# Patient Record
Sex: Female | Born: 1965 | Race: Black or African American | Hispanic: No | Marital: Single | Smoking: Current some day smoker
Health system: Southern US, Community
[De-identification: ages and names within clinical notes are randomized; demographics above are authoritative.]

## PROBLEM LIST (undated history)

## (undated) DIAGNOSIS — F32A Depression, unspecified: Secondary | ICD-10-CM

## (undated) DIAGNOSIS — K219 Gastro-esophageal reflux disease without esophagitis: Secondary | ICD-10-CM

## (undated) DIAGNOSIS — J45909 Unspecified asthma, uncomplicated: Secondary | ICD-10-CM

## (undated) DIAGNOSIS — M1990 Unspecified osteoarthritis, unspecified site: Secondary | ICD-10-CM

## (undated) DIAGNOSIS — I10 Essential (primary) hypertension: Secondary | ICD-10-CM

## (undated) DIAGNOSIS — J449 Chronic obstructive pulmonary disease, unspecified: Secondary | ICD-10-CM

## (undated) HISTORY — PX: APPENDECTOMY: SHX54

## (undated) HISTORY — DX: Chronic obstructive pulmonary disease, unspecified: J44.9

---

## 2015-07-18 ENCOUNTER — Other Ambulatory Visit (HOSPITAL_COMMUNITY): Payer: Self-pay | Admitting: Physician Assistant

## 2015-07-18 DIAGNOSIS — Z1231 Encounter for screening mammogram for malignant neoplasm of breast: Secondary | ICD-10-CM

## 2015-07-28 ENCOUNTER — Ambulatory Visit (HOSPITAL_COMMUNITY)
Admission: RE | Admit: 2015-07-28 | Discharge: 2015-07-28 | Disposition: A | Discharge status: Home / Self Care | Payer: Self-pay | Source: Ambulatory Visit | Attending: Physician Assistant | Admitting: Physician Assistant

## 2015-07-28 ENCOUNTER — Other Ambulatory Visit (HOSPITAL_COMMUNITY): Payer: Self-pay | Admitting: Physician Assistant

## 2015-07-28 DIAGNOSIS — R059 Cough, unspecified: Secondary | ICD-10-CM

## 2015-07-28 DIAGNOSIS — F17200 Nicotine dependence, unspecified, uncomplicated: Secondary | ICD-10-CM | POA: Insufficient documentation

## 2015-07-28 DIAGNOSIS — Z1231 Encounter for screening mammogram for malignant neoplasm of breast: Secondary | ICD-10-CM | POA: Insufficient documentation

## 2015-07-28 DIAGNOSIS — R05 Cough: Secondary | ICD-10-CM

## 2016-07-18 ENCOUNTER — Ambulatory Visit: Payer: Self-pay | Admitting: Physician Assistant

## 2016-07-22 ENCOUNTER — Encounter: Payer: Self-pay | Admitting: Physician Assistant

## 2019-04-20 ENCOUNTER — Other Ambulatory Visit: Payer: Self-pay

## 2019-04-20 ENCOUNTER — Ambulatory Visit: Payer: Self-pay | Admitting: Physician Assistant

## 2019-04-20 ENCOUNTER — Encounter: Payer: Self-pay | Admitting: Physician Assistant

## 2019-04-20 VITALS — BP 128/88 | HR 102 | Temp 97.5°F

## 2019-04-20 DIAGNOSIS — Z7689 Persons encountering health services in other specified circumstances: Secondary | ICD-10-CM

## 2019-04-20 DIAGNOSIS — J209 Acute bronchitis, unspecified: Secondary | ICD-10-CM

## 2019-04-20 DIAGNOSIS — F17200 Nicotine dependence, unspecified, uncomplicated: Secondary | ICD-10-CM | POA: Insufficient documentation

## 2019-04-20 MED ORDER — BENZONATATE 100 MG PO CAPS: ORAL_CAPSULE | ORAL | 3 refills | Status: DC

## 2019-04-20 MED ORDER — ALBUTEROL SULFATE HFA 108 (90 BASE) MCG/ACT IN AERS
2.0000 | INHALATION_SPRAY | Freq: Four times a day (QID) | RESPIRATORY_TRACT | 1 refills | Status: DC | PRN
Start: 2019-04-20 — End: 2019-06-23

## 2019-04-20 MED ORDER — AZITHROMYCIN 250 MG PO TABS: ORAL_TABLET | ORAL | 0 refills | Status: DC

## 2019-04-20 NOTE — Patient Instructions (Signed)
How to Use a Soft Mist Inhaler    A soft mist inhaler is a handheld device for taking medicine that you breathe (inhale) into your lungs. The device changes a liquid medicine into a mist that can be inhaled.  You may need a soft mist inhaler if you have a disease that causes your breathing tubes to narrow (bronchospasm). Using a soft mist inhaler helps prevent bronchospasm and keeps your airway open. A soft mist inhaler may be part of your long-term treatment for asthma or chronic obstructive pulmonary disease (COPD). The usual dosage is two inhalations every day.  What are the risks?   If you do not use your inhaler correctly, medicine might not reach your lungs to help you breathe.   The medicine in the inhaler can cause side effects, such as:  ? Chest tightness or difficulty breathing.  ? Eye redness, eye pain, or vision changes.  ? Difficulty passing urine.  ? Dry mouth.  ? Sore throat.  ? Cough.  ? Headache.  ? Sinus congestion (sinusitis).  Supplies needed:   Inhaler. The medicine that you will need comes in the inhaler. Each device contains the amount of medicine needed for 60 uses (30 daily doses of 2 inhalations).  How to use a soft mist inhaler  Using an inhaler for the first time  1. Remove the clear base of the inhaler by pressing the safety catch on the cap with your thumb and pulling off the clear base with your other hand.  2. On the label, write down the date that will be three months from now. This is the date you should throw away the inhaler.  3. Place the medicine cartridge that comes with the inhaler into the base of the inhaler. Press the cartridge on a flat surface to click it into place. Click the clear plastic base back into place over the cartridge.  4. Turn the clear base in the direction of the arrows on the label until you hear a click.  5. Open the cap on top of the inhaler. It should snap open all the way to show you the mouthpiece.  6. Prepare (prime) the inhaler for use. To do  this, point the inhaler toward the ground and press on the dose-release button below the mouthpiece. You should see the release of some mist. Be careful not to get any mist into your eyes. If you do not see mist, turn the base, open the cap, and prime the inhaler again until you see the mist. If you still do not see the mist, return the inhaler to your pharmacist for help.  Taking an inhaled dose  1. Hold the inhaler upright.  2. Use your thumb and pointer finger to turn the base of the inhaler until you hear a click. This means the dose chamber is ready to deliver the medicine.  3. Open the cap until you hear a click.  4. Hold the inhaler in one hand with your pointer finger over the dose-release button.  5. Turn your head away from the inhaler and breathe out slowly.  6. Close your lips around the mouthpiece.  7. Point the inhaler toward the back of your mouth.  8. Press the dose-release button while taking a slow, deep breath through your mouth.  9. Hold your breath for 10 seconds, or as long as you can.  10. Turn your head away from the inhaler and breath out slowly through pursed lips.  11. Take a second   inhalation, if your health care provider told you to. Do not take extra doses if you do not feel the mist as you inhale.  Follow these instructions at home:  General instructions   Check the indicator on the inhaler to keep track of your doses. When the indicator is in the red zone, you have 7 days left. Get a refill at this time. The inhaler will lock when it is empty.   Throw away your inhaler if you have not used it in more than 30 days.   Do not use any products that contain nicotine or tobacco, such as cigarettes and e-cigarettes. If you need help quitting, ask your health care provider.   Tell your provider about:  ? All your medical conditions. Use soft mist medicine with caution if you have glaucoma, an enlarged prostate, or kidney disease.  ? If you are or may become pregnant.  ? All medicines you  take. Some medicines can affect (interact with) the medicines in your inhaler.   Keep all follow-up visits as told by your health care provider. This is important.  Using your inhaler   Use your soft mist inhaler only as told by your health care provider.   Do inhalations at about the same time each day.   If you have not used your inhaler for more than 3 days, release a mist dose toward the ground before using.   If you have not used your inhaler for more than 21 days, open the cap, turn the base, and prime your inhaler until you see mist. Repeat these steps three more times before using the inhaler.  Caring for your inhaler   Store your soft mist inhaler at room temperature and keep it out of reach of children.   Clean the mouthpiece of your inhaler with a damp, clean, cloth once every week.  Contact a health care provider if:   You have a very dry mouth or sore throat.   You have a fever.   You have stuffy nose (nasal congestion) or nasal discharge.   You have a cough that does not go away (is persistent).   You have a headache.   You have trouble passing urine.   You are not sure how to use your inhaler or your inhaler is not working properly.  Get help right away if:   You have an allergic reaction. Some symptoms of an allergic reaction are an itchy rash, swelling of your face or tongue, or difficulty breathing.   You have severe and sudden eye pain or changes in your vision.  Summary   A soft mist inhaler is a treatment for COPD or asthma.   You may have to take two inhalations each day on a long-term basis to prevent bronchospasm.   Follow instructions carefully in order to use your inhaler properly.   Common side effects include throat or sinus infection, dry mouth, cough, and headache.   Get help right away if you have an allergic reaction, sudden eye pain, or changes in vision.  This information is not intended to replace advice given to you by your health care provider. Make sure you  discuss any questions you have with your health care provider.  Document Released: 12/05/2016 Document Revised: 12/05/2016 Document Reviewed: 12/05/2016  Elsevier Interactive Patient Education  2019 [employer]

## 2019-04-20 NOTE — Progress Notes (Signed)
BP 128/88   Pulse (!) 102   Temp (!) 97.5 F (36.4 C)   SpO2 99%    Subjective:    Patient ID: Judy Leonard, female    DOB: 14-Dec-1966, 53 y.o.   MRN: [id number]  HPI: Judy Leonard is a 53 y.o. female presenting on 04/20/2019 for New Patient (Initial Visit) ([facility]. [facility] (as [facility]). pt states she has not been seen anywhere else since. pt states [facility] had problems with her lungs.); Back Pain (upper back raditaes down to lower back); and Cough (worse at night. pt states has phlegm. cough began around 01/2019. pt does not take anything for congestion or cough)   HPI     [facility]- she only came in for 2 appoitments- June and July 2016.  At that time she had   Elevated triglycerides, tobacco use disorder, cough.   Pt is still smoking although she has cut back to about 6/day  Pt thinks she started coughing in January. She says she is  Not coughing blood.  The cough is  Worse at night.   She is unaware of wheezing.   She states No fevers.    [facility].  her Last mammogram was in 2016.    Relevant past medical, surgical, family and social history reviewed and updated as indicated. Interim medical history since our last visit reviewed. Allergies and medications reviewed and updated.  CURRENT MEDS: none  Review of Systems  Per HPI unless specifically indicated above     Objective:    BP 128/88   Pulse (!) 102   Temp (!) 97.5 F (36.4 C)   SpO2 99%   Wt Readings from Last 3 Encounters:  No data found for Wt    Physical Exam Vitals signs reviewed.  Constitutional:      Appearance: She is well-developed.  HENT:     Head: Normocephalic and atraumatic.     Right Ear: Tympanic membrane normal.     Left Ear: Tympanic membrane normal.  Eyes:     Conjunctiva/sclera: Conjunctivae normal.     Pupils: Pupils are equal, round, and reactive to light.  Neck:   Musculoskeletal: Neck supple.     Thyroid: No thyromegaly.  Cardiovascular:     Rate and Rhythm: Normal rate and regular rhythm.  Pulmonary:     Effort: Pulmonary effort is normal.     Breath sounds: Normal breath sounds.  Abdominal:     General: Bowel sounds are normal.     Palpations: Abdomen is soft. There is no mass.     Tenderness: There is no abdominal tenderness.  Lymphadenopathy:     Cervical: No cervical adenopathy.  Skin:    General: Skin is warm and dry.  Neurological:     Mental Status: She is alert and oriented to person, place, and time.     Gait: Gait normal.  Psychiatric:        Behavior: Behavior normal.     No results found for this or any previous visit.    Assessment & Plan:   Encounter Diagnoses  Name Primary?  . Encounter to establish care Yes  . Acute bronchitis, unspecified organism   . Tobacco use disorder       -will update mammogram when those are being ordered again (due to CV19) -will order Inhaler from [facility].  Pt is given reading information  on how to properly use it  -rx zpack and tessalon -counseled pt on smoking cessation  -Pt has a smartphone  -will schedule pt to follow up in 2 months.  She is counseled to contact clinic prior to that if needed for worsening or new symptoms

## 2019-05-20 ENCOUNTER — Other Ambulatory Visit: Payer: Self-pay | Admitting: Physician Assistant

## 2019-05-20 DIAGNOSIS — Z1239 Encounter for other screening for malignant neoplasm of breast: Secondary | ICD-10-CM

## 2019-06-07 ENCOUNTER — Other Ambulatory Visit: Payer: Self-pay

## 2019-06-07 ENCOUNTER — Ambulatory Visit (HOSPITAL_COMMUNITY)
Admission: RE | Admit: 2019-06-07 | Discharge: 2019-06-07 | Disposition: A | Discharge status: Home / Self Care | Payer: Self-pay | Source: Ambulatory Visit | Attending: Physician Assistant | Admitting: Physician Assistant

## 2019-06-07 DIAGNOSIS — Z1239 Encounter for other screening for malignant neoplasm of breast: Secondary | ICD-10-CM | POA: Insufficient documentation

## 2019-06-10 ENCOUNTER — Other Ambulatory Visit: Payer: Self-pay | Admitting: Physician Assistant

## 2019-06-10 DIAGNOSIS — Z8639 Personal history of other endocrine, nutritional and metabolic disease: Secondary | ICD-10-CM

## 2019-06-22 ENCOUNTER — Encounter: Payer: Self-pay | Admitting: Physician Assistant

## 2019-06-22 ENCOUNTER — Ambulatory Visit: Payer: Self-pay | Admitting: Physician Assistant

## 2019-06-22 DIAGNOSIS — J449 Chronic obstructive pulmonary disease, unspecified: Secondary | ICD-10-CM

## 2019-06-22 DIAGNOSIS — F17200 Nicotine dependence, unspecified, uncomplicated: Secondary | ICD-10-CM

## 2019-06-22 NOTE — Progress Notes (Signed)
   There were no vitals taken for this visit.   Subjective:    Patient ID: Judy Leonard, female    DOB: February 24, 1966, 53 y.o.   MRN: [id number]  HPI: Judy Leonard is a 53 y.o. female presenting on 06/22/2019 for No chief complaint on file.   HPI    This is a telemedicine visit through [employer] due to coronavirus pandemic  I connected with  Judy Leonard on 06/22/19 by a video enabled telemedicine application and verified that I am speaking with the correct person using two identifiers.   I discussed the limitations of evaluation and management by telemedicine. The patient expressed understanding and agreed to proceed.  Pt is at home.  Provider is at office.    Pt is still having cough but is not any worse than when she was seen in April.  The bad wheeziing has cleared up.  She uses her inhaler at least twice daily and sometimes up to 4 ttimes daily.   She is still smoking.  She says she is feeling "pretty good".  Pt did not get her labs drawn.   Relevant past medical, surgical, family and social history reviewed and updated as indicated. Interim medical history since our last visit reviewed. Allergies and medications reviewed and updated.   Current Outpatient Medications:  .  albuterol (VENTOLIN HFA) 108 (90 Base) MCG/ACT inhaler, Inhale 2 puffs into the lungs every 6 (six) hours as needed for wheezing or shortness of breath., Disp: 1 Inhaler, Rfl: 1 .  benzonatate (TESSALON) 100 MG capsule, 1 - 2 po q 8 hour prn cough, Disp: 20 capsule, Rfl: 3    Review of Systems  Per HPI unless specifically indicated above     Objective:    There were no vitals taken for this visit.  Wt Readings from Last 3 Encounters:  No data found for Wt    Physical Exam Constitutional:      General: She is not in acute distress.    Appearance: Normal appearance. She is not ill-appearing.  HENT:     Head: Normocephalic and atraumatic.  Pulmonary:     Effort: Pulmonary effort is  normal. No respiratory distress.  Neurological:     Mental Status: She is alert and oriented to person, place, and time.  Psychiatric:        Attention and Perception: Attention normal.        Mood and Affect: Mood normal.        Speech: Speech normal.        Behavior: Behavior normal. Behavior is cooperative.     No results found for this or any previous visit.    Assessment & Plan:    Encounter Diagnoses  Name Primary?  . Chronic obstructive pulmonary disease, unspecified COPD type ([place]) Yes  . Tobacco use disorder     -Will get pt signed up for [facility] medication assistance program and order dulera for her as a maintenance inhaler.  She is to continue with albuterol MDI prn.  Encouraged smoking cessation.  Discussed that inhalers will only be able to help so much if she continues smoking and that continued smoking will continue to worsen her breathing.  Pt states understanding.   -pt to get fasting labs -pt to follow up in 6 weeks to check on her breathing on the dulera.  She is to contact office sooner prn

## 2019-06-23 ENCOUNTER — Other Ambulatory Visit: Payer: Self-pay | Admitting: Physician Assistant

## 2019-06-23 ENCOUNTER — Other Ambulatory Visit: Payer: Self-pay

## 2019-06-23 ENCOUNTER — Other Ambulatory Visit (HOSPITAL_COMMUNITY)
Admission: RE | Admit: 2019-06-23 | Discharge: 2019-06-23 | Disposition: A | Discharge status: Home / Self Care | Payer: Self-pay | Source: Ambulatory Visit | Attending: Physician Assistant | Admitting: Physician Assistant

## 2019-06-23 DIAGNOSIS — Z8639 Personal history of other endocrine, nutritional and metabolic disease: Secondary | ICD-10-CM | POA: Insufficient documentation

## 2019-06-23 LAB — COMPREHENSIVE METABOLIC PANEL
ALT: 59 U/L — ABNORMAL HIGH (ref 0–44)
AST: 76 U/L — ABNORMAL HIGH (ref 15–41)
Albumin: 3.7 g/dL (ref 3.5–5.0)
Alkaline Phosphatase: 107 U/L (ref 38–126)
Anion gap: 10 (ref 5–15)
BUN: 8 mg/dL (ref 6–20)
CO2: 28 mmol/L (ref 22–32)
Calcium: 9.4 mg/dL (ref 8.9–10.3)
Chloride: 102 mmol/L (ref 98–111)
Creatinine, Ser: 0.73 mg/dL (ref 0.44–1.00)
GFR calc Af Amer: >60 mL/min (ref >60)
GFR calc non Af Amer: >60 mL/min (ref >60)
Glucose, Bld: 112 mg/dL — ABNORMAL HIGH (ref 70–99)
Potassium: 4.9 mmol/L (ref 3.5–5.1)
Sodium: 140 mmol/L (ref 135–145)
Total Bilirubin: 1.1 mg/dL (ref 0.3–1.2)
Total Protein: 7.3 g/dL (ref 6.5–8.1)

## 2019-06-23 LAB — LIPID PANEL
Cholesterol: 217 mg/dL — ABNORMAL HIGH (ref 0–200)
HDL: 72 mg/dL (ref >40)
LDL Cholesterol: 126 mg/dL — ABNORMAL HIGH (ref 0–99)
Total CHOL/HDL Ratio: 3 RATIO
Triglycerides: 96 mg/dL (ref <150)
VLDL: 19 mg/dL (ref 0–40)

## 2019-06-23 MED ORDER — DULERA 100-5 MCG/ACT IN AERO: 2.0000 | INHALATION_SPRAY | Freq: Two times a day (BID) | RESPIRATORY_TRACT | 1 refills | Status: DC

## 2019-08-17 ENCOUNTER — Ambulatory Visit: Payer: Self-pay | Admitting: Physician Assistant

## 2019-08-17 ENCOUNTER — Encounter: Payer: Self-pay | Admitting: Physician Assistant

## 2019-08-17 DIAGNOSIS — J449 Chronic obstructive pulmonary disease, unspecified: Secondary | ICD-10-CM

## 2019-08-17 DIAGNOSIS — R7989 Other specified abnormal findings of blood chemistry: Secondary | ICD-10-CM

## 2019-08-17 DIAGNOSIS — G8929 Other chronic pain: Secondary | ICD-10-CM

## 2019-08-17 DIAGNOSIS — M549 Dorsalgia, unspecified: Secondary | ICD-10-CM

## 2019-08-17 DIAGNOSIS — E785 Hyperlipidemia, unspecified: Secondary | ICD-10-CM

## 2019-08-17 MED ORDER — MELOXICAM 15 MG PO TABS: 15.0000 mg | ORAL_TABLET | Freq: Every day | ORAL | 0 refills | Status: DC | PRN

## 2019-08-17 NOTE — Patient Instructions (Signed)

## 2019-08-17 NOTE — Progress Notes (Signed)
There were no vitals taken for this visit.   Subjective:    Patient ID: Judy Leonard, female    DOB: 1966-05-02, 53 y.o.   MRN: [id number]  HPI: Judy JunesDorothea A. Wyline Leonard is a 53 y.o. female presenting on 08/17/2019 for No chief complaint on file.   HPI  This is a telemedicine appointment through [employer] due to coronavirus pandemic  I connected with  Charma A. Levitz on 08/17/19 by a video enabled telemedicine application and verified that I am speaking with the correct person using two identifiers.   I discussed the limitations of evaluation and management by telemedicine. The patient expressed understanding and agreed to proceed.  Pt is at home.  Provider is at office.   Pt says she hasn't smoked for about the past month.    She is using the dulera inhaler.  Her breathing is improved.    Pt says her back hurts.  She says it hurts like that for alost a year.  It hurts in the top, the middle and sometimes the lower.  Pt doesn't work currently.  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  IBU doesn't help her pain.    Pt says she exercises by walking around the yard for about 10 minutes.  She says she starts coughing about that time (after 10 minutes).    She doesn't use ice or heat on the back.   She denies injury that could have caused her back pain.  She has no numbness or tingling in the extremities.  She denies weakness.     Relevant past medical, surgical, family and social history reviewed and updated as indicated. Interim medical history since our last visit reviewed. Allergies and medications reviewed and updated.   Current Outpatient Medications:  .  benzonatate (TESSALON) 100 MG capsule, 1 - 2 po q 8 hour prn cough, Disp: 20 capsule, Rfl: 3 .  mometasone-formoterol (DULERA) 100-5 MCG/ACT AERO, Inhale 2 puffs into the lungs 2 (two) times daily., Disp: 39 g, Rfl: 1 .  PROVENTIL HFA 108 (90 Base) MCG/ACT inhaler, INHALE 2 PUFFS BY MOUTH EVERY 6  HOURS AS NEEDED FOR COUGHING, WHEEZING, OR SHORTNESS OF BREATH, Disp: 6.7 g, Rfl: 1    Review of Systems  Per HPI unless specifically indicated above     Objective:    There were no vitals taken for this visit.  Wt Readings from Last 3 Encounters:  No data found for Wt    Physical Exam Constitutional:      General: She is not in acute distress.    Appearance: Normal appearance. She is not ill-appearing.  HENT:     Head: Normocephalic and atraumatic.  Pulmonary:     Effort: Pulmonary effort is normal. No respiratory distress.  Musculoskeletal:     Cervical back: She exhibits normal range of motion.     Thoracic back: She exhibits normal range of motion.     Lumbar back: She exhibits normal range of motion.     Comments: Pt has good ROM.  She is able to move all extremities normally.  She has good flexion and extension of the back.  Neurological:     Mental Status: She is alert and oriented to person, place, and time.  Psychiatric:        Attention and Perception: Attention normal.        Speech: Speech normal.        Behavior: Behavior is cooperative.  Assessment & Plan:    Encounter Diagnoses  Name Primary?  . Chronic obstructive pulmonary disease, unspecified COPD type ([place]) Yes  . Chronic back pain, unspecified back location, unspecified back pain laterality   . Hyperlipidemia, unspecified hyperlipidemia type   . Elevated LFTs      -Congratulate pt on stopping smoking.  Encouraged her to continue to avoid smoking -Exercises for back mailed to pt.  rx mobic with instructions to avoid IBU or other nsaid while taking.  Use ice and/or heat -she is reminded to be following a lowfat diet for her lipids -pt to follow up 3 months to check copd.  She is to contact office sooner prn

## 2019-11-10 ENCOUNTER — Other Ambulatory Visit (HOSPITAL_COMMUNITY)
Admission: RE | Admit: 2019-11-10 | Discharge: 2019-11-10 | Disposition: A | Discharge status: Home / Self Care | Payer: Self-pay | Source: Ambulatory Visit | Attending: Physician Assistant | Admitting: Physician Assistant

## 2019-11-10 DIAGNOSIS — R7989 Other specified abnormal findings of blood chemistry: Secondary | ICD-10-CM | POA: Insufficient documentation

## 2019-11-10 DIAGNOSIS — E785 Hyperlipidemia, unspecified: Secondary | ICD-10-CM | POA: Insufficient documentation

## 2019-11-10 LAB — COMPREHENSIVE METABOLIC PANEL
ALT: 57 U/L — ABNORMAL HIGH (ref 0–44)
AST: 58 U/L — ABNORMAL HIGH (ref 15–41)
Albumin: 3.8 g/dL (ref 3.5–5.0)
Alkaline Phosphatase: 105 U/L (ref 38–126)
Anion gap: 9 (ref 5–15)
BUN: 10 mg/dL (ref 6–20)
CO2: 25 mmol/L (ref 22–32)
Calcium: 9.4 mg/dL (ref 8.9–10.3)
Chloride: 105 mmol/L (ref 98–111)
Creatinine, Ser: 0.62 mg/dL (ref 0.44–1.00)
GFR calc Af Amer: >60 mL/min (ref >60)
GFR calc non Af Amer: >60 mL/min (ref >60)
Glucose, Bld: 110 mg/dL — ABNORMAL HIGH (ref 70–99)
Potassium: 4.1 mmol/L (ref 3.5–5.1)
Sodium: 139 mmol/L (ref 135–145)
Total Bilirubin: 0.8 mg/dL (ref 0.3–1.2)
Total Protein: 7.4 g/dL (ref 6.5–8.1)

## 2019-11-10 LAB — LIPID PANEL
Cholesterol: 209 mg/dL — ABNORMAL HIGH (ref 0–200)
HDL: 58 mg/dL (ref >40)
LDL Cholesterol: 118 mg/dL — ABNORMAL HIGH (ref 0–99)
Total CHOL/HDL Ratio: 3.6 RATIO
Triglycerides: 163 mg/dL — ABNORMAL HIGH (ref <150)
VLDL: 33 mg/dL (ref 0–40)

## 2019-11-15 ENCOUNTER — Ambulatory Visit: Payer: Self-pay | Admitting: Physician Assistant

## 2019-11-15 ENCOUNTER — Encounter: Payer: Self-pay | Admitting: Physician Assistant

## 2019-11-15 DIAGNOSIS — F17200 Nicotine dependence, unspecified, uncomplicated: Secondary | ICD-10-CM

## 2019-11-15 DIAGNOSIS — G8929 Other chronic pain: Secondary | ICD-10-CM

## 2019-11-15 DIAGNOSIS — J449 Chronic obstructive pulmonary disease, unspecified: Secondary | ICD-10-CM

## 2019-11-15 DIAGNOSIS — R7989 Other specified abnormal findings of blood chemistry: Secondary | ICD-10-CM

## 2019-11-15 DIAGNOSIS — E785 Hyperlipidemia, unspecified: Secondary | ICD-10-CM

## 2019-11-15 NOTE — Progress Notes (Signed)
There were no vitals taken for this visit.   Subjective:    Patient ID: Judy Leonard, female    DOB: November 23, 1966, 53 y.o.   MRN: [id number]  HPI: Judy Leonard is a 53 y.o. female presenting on 11/15/2019 for No chief complaint on file.   HPI   This is a telemedicine appointment due to coronavirus pandemic.  It is via Telephone as pt does not have a video enabled device  I connected with  Judy Leonard on 11/15/19 by a video enabled telemedicine application and verified that I am speaking with the correct person using two identifiers.   I discussed the limitations of evaluation and management by telemedicine. The patient expressed understanding and agreed to proceed.  Pt is at home.  Provider is at home office.    Pt is currently Not working- back hurts - lower back and neck.  She previously worked Neurosurgeon.    She continues to smoke.  She is not using dulera properly because she is trying to make it last longer.    She is needing to use her albuterol most days.   Pt has no new complaints today.    Relevant past medical, surgical, family and social history reviewed and updated as indicated. Interim medical history since our last visit reviewed. Allergies and medications reviewed and updated.    Current Outpatient Medications:  .  benzonatate (TESSALON) 100 MG capsule, 1 - 2 po q 8 hour prn cough, Disp: 20 capsule, Rfl: 3 .  meloxicam (MOBIC) 15 MG tablet, Take 1 tablet (15 mg total) by mouth daily as needed for pain., Disp: 30 tablet, Rfl: 0 .  mometasone-formoterol (DULERA) 100-5 MCG/ACT AERO, Inhale 2 puffs into the lungs 2 (two) times daily., Disp: 39 g, Rfl: 1 .  PROVENTIL HFA 108 (90 Base) MCG/ACT inhaler, INHALE 2 PUFFS BY MOUTH EVERY 6 HOURS AS NEEDED FOR COUGHING, WHEEZING, OR SHORTNESS OF BREATH, Disp: 6.7 g, Rfl: 1   Review of Systems  Per HPI unless specifically indicated above     Objective:    There were no vitals taken for this  visit.  Wt Readings from Last 3 Encounters:  No data found for Wt    Physical Exam Pulmonary:     Effort: No respiratory distress.  Neurological:     Mental Status: She is alert and oriented to person, place, and time.  Psychiatric:        Attention and Perception: Attention normal.        Speech: Speech normal.        Behavior: Behavior is cooperative.     Results for orders placed or performed during the hospital encounter of 11/10/19  Lipid panel  Result Value Ref Range   Cholesterol 209 (H) 0 - 200 mg/dL   Triglycerides 163 (H) <150 mg/dL   HDL 58 >40 mg/dL   Total CHOL/HDL Ratio 3.6 RATIO   VLDL 33 0 - 40 mg/dL   LDL Cholesterol 118 (H) 0 - 99 mg/dL  Comprehensive metabolic panel  Result Value Ref Range   Sodium 139 135 - 145 mmol/L   Potassium 4.1 3.5 - 5.1 mmol/L   Chloride 105 98 - 111 mmol/L   CO2 25 22 - 32 mmol/L   Glucose, Bld 110 (H) 70 - 99 mg/dL   BUN 10 6 - 20 mg/dL   Creatinine, Ser 0.62 0.44 - 1.00 mg/dL   Calcium 9.4 8.9 - 10.3 mg/dL   Total Protein 7.4  6.5 - 8.1 g/dL   Albumin 3.8 3.5 - 5.0 g/dL   AST 58 (H) 15 - 41 U/L   ALT 57 (H) 0 - 44 U/L   Alkaline Phosphatase 105 38 - 126 U/L   Total Bilirubin 0.8 0.3 - 1.2 mg/dL   GFR calc non Af Amer >60 >60 mL/min   GFR calc Af Amer >60 >60 mL/min   Anion gap 9 5 - 15      Assessment & Plan:    1.  COPD/tobacco use disorder  Discussed with patient that she gets her medications in the mail for free at no cost therefore she needs to use her Dulera every day twice a day as prescribed and that I can work better.  Discussed that she will get refills so she needs to use the medicine so that it can work properly.  I did encourage patient to stop smoking as this will provide her the best bet for improved breathing.  Refills are sent to [facility].  2 back pain chronic  We will get x-rays of the neck and lumbar spine.  Patient will be mailed a change charity care financial assistance application that she  needs to fill out to cover the cost for the x-rays.  She will be called with the results of the x-rays when they are done.  3.  Dyslipidemia  Reviewed labs with patient.  Encourage patient to follow a low-fat diet and regular exercise to help her lipids.  No prescription medication for the lipids at this time.  4.  Elevated liver function studies.  Patient is encouraged to avoid taking more Tylenol than indicated on the bottle.  Recommended that patient use NSAIDs as primary analgesic.  Patient is encouraged to avoid drinking.  We will continue to monitor the liver function studies.   Patient is to follow-up in 3 months for recheck of her COPD.  She is to contact office sooner if needed.  No labs prior to next appointment.

## 2019-12-02 ENCOUNTER — Other Ambulatory Visit: Payer: Self-pay | Admitting: Physician Assistant

## 2019-12-09 ENCOUNTER — Other Ambulatory Visit: Payer: Self-pay | Admitting: Physician Assistant

## 2019-12-09 MED ORDER — MELOXICAM 15 MG PO TABS: 15.0000 mg | ORAL_TABLET | Freq: Every day | ORAL | 0 refills | Status: DC | PRN

## 2020-01-04 ENCOUNTER — Ambulatory Visit (HOSPITAL_COMMUNITY)
Admission: RE | Admit: 2020-01-04 | Discharge: 2020-01-04 | Disposition: A | Discharge status: Home / Self Care | Payer: Self-pay | Source: Ambulatory Visit | Attending: Physician Assistant | Admitting: Physician Assistant

## 2020-01-04 ENCOUNTER — Other Ambulatory Visit: Payer: Self-pay

## 2020-01-04 DIAGNOSIS — M549 Dorsalgia, unspecified: Secondary | ICD-10-CM | POA: Insufficient documentation

## 2020-01-04 DIAGNOSIS — G8929 Other chronic pain: Secondary | ICD-10-CM | POA: Insufficient documentation

## 2020-01-05 ENCOUNTER — Other Ambulatory Visit: Payer: Self-pay | Admitting: Physician Assistant

## 2020-02-28 ENCOUNTER — Other Ambulatory Visit: Payer: Self-pay

## 2020-02-28 ENCOUNTER — Ambulatory Visit: Payer: Self-pay | Admitting: Physician Assistant

## 2020-02-28 ENCOUNTER — Encounter: Payer: Self-pay | Admitting: Physician Assistant

## 2020-02-28 VITALS — BP 128/86 | HR 89 | Temp 97.7°F | Wt 198.6 lb

## 2020-02-28 DIAGNOSIS — M542 Cervicalgia: Secondary | ICD-10-CM

## 2020-02-28 DIAGNOSIS — M545 Low back pain, unspecified: Secondary | ICD-10-CM

## 2020-02-28 DIAGNOSIS — G8929 Other chronic pain: Secondary | ICD-10-CM

## 2020-02-28 DIAGNOSIS — Z1239 Encounter for other screening for malignant neoplasm of breast: Secondary | ICD-10-CM

## 2020-02-28 DIAGNOSIS — Z1211 Encounter for screening for malignant neoplasm of colon: Secondary | ICD-10-CM

## 2020-02-28 DIAGNOSIS — F17200 Nicotine dependence, unspecified, uncomplicated: Secondary | ICD-10-CM

## 2020-02-28 DIAGNOSIS — M549 Dorsalgia, unspecified: Secondary | ICD-10-CM

## 2020-02-28 DIAGNOSIS — J449 Chronic obstructive pulmonary disease, unspecified: Secondary | ICD-10-CM

## 2020-02-28 MED ORDER — MELOXICAM 15 MG PO TABS: 15.0000 mg | ORAL_TABLET | Freq: Every day | ORAL | 3 refills | Status: DC | PRN

## 2020-02-28 NOTE — Progress Notes (Signed)
BP 128/86   Pulse 89   Temp 97.7 F (36.5 C)   Wt 198 lb 9.6 oz (90.1 kg)   LMP 04/28/2015   SpO2 95%    Subjective:    Patient ID: Judy Leonard, female    DOB: 21-Jun-1966, 54 y.o.   MRN: [id number]  HPI: Judy Leonard is a 54 y.o. female presenting on 02/28/2020 for COPD   HPI  Pt had a negative covid 19 screening questionnaire.    Pt is 63yoF with follow up appointment today for COPD.  Pt is Still smoking.  She is Still some breathing issues.  She got CAFA / cone chairty financial assistance but doesn't remember what % but it wasn't 100%.    Pt says mobic helped at first.  Not so much now.    It was given to help with her chronic neck and back pain.  Details and exam in prior office notes.     Relevant past medical, surgical, family and social history reviewed and updated as indicated. Interim medical history since our last visit reviewed. Allergies and medications reviewed and updated.   Current Outpatient Medications:  .  benzonatate (TESSALON) 100 MG capsule, TAKE 1 TO 2 CAPSULES BY MOUTH EVERY 8 HOURS AS NEEDED FOR COUGH, Disp: 20 capsule, Rfl: 3 .  ibuprofen (ADVIL) 200 MG tablet, Take 200 mg by mouth every 6 (six) hours as needed., Disp: , Rfl:  .  meloxicam (MOBIC) 15 MG tablet, Take 1 tablet (15 mg total) by mouth daily as needed for pain., Disp: 30 tablet, Rfl: 0 .  mometasone-formoterol (DULERA) 100-5 MCG/ACT AERO, Inhale 2 puffs into the lungs 2 (two) times daily., Disp: 39 g, Rfl: 1 .  PROVENTIL HFA 108 (90 Base) MCG/ACT inhaler, INHALE 2 PUFFS BY MOUTH EVERY 6 HOURS AS NEEDED FOR COUGHING, WHEEZING, OR SHORTNESS OF BREATH, Disp: 6.7 g, Rfl: 1     Review of Systems  Per HPI unless specifically indicated above     Objective:    BP 128/86   Pulse 89   Temp 97.7 F (36.5 C)   Wt 198 lb 9.6 oz (90.1 kg)   LMP 04/28/2015   SpO2 95%   Wt Readings from Last 3 Encounters:  02/28/20 198 lb 9.6 oz (90.1 kg)    Physical Exam Vitals  reviewed.  Constitutional:      General: She is not in acute distress.    Appearance: She is well-developed. She is not ill-appearing.  HENT:     Head: Normocephalic and atraumatic.  Cardiovascular:     Rate and Rhythm: Normal rate and regular rhythm.  Pulmonary:     Effort: Pulmonary effort is normal.     Breath sounds: Normal breath sounds.  Abdominal:     General: Bowel sounds are normal.     Palpations: Abdomen is soft. There is no mass.     Tenderness: There is no abdominal tenderness.  Musculoskeletal:     Cervical back: Neck supple.     Right lower leg: No edema.     Left lower leg: No edema.  Lymphadenopathy:     Cervical: No cervical adenopathy.  Skin:    General: Skin is warm and dry.  Neurological:     Mental Status: She is alert and oriented to person, place, and time.  Psychiatric:        Attention and Perception: Attention normal.        Behavior: Behavior normal. Behavior is cooperative.  Assessment & Plan:   Encounter Diagnoses  Name Primary?  . Chronic obstructive pulmonary disease, unspecified COPD type (HCC) Yes  . Tobacco use disorder   . Chronic back pain, unspecified back location, unspecified back pain laterality   . Chronic neck pain   . Chronic low back pain, unspecified back pain laterality, unspecified whether sciatica present   . Screening for colon cancer   . Encounter for screening for malignant neoplasm of breast, unspecified screening modality      -pt will be due for screening mammogram in  June -Refer to orthopedics for neck/back pain -pt was given  ifobt for colon cancer screening -pt is counseled that her COPD will continue to worsen as long as she continues to smoke despite our best treatments with inhalers.  She is encouraged to stop  -pt to F/u 6 months.  She is to contact office sooner prn

## 2020-03-09 ENCOUNTER — Other Ambulatory Visit: Payer: Self-pay | Admitting: Physician Assistant

## 2020-03-09 DIAGNOSIS — Z1211 Encounter for screening for malignant neoplasm of colon: Secondary | ICD-10-CM

## 2020-03-09 LAB — IFOBT (OCCULT BLOOD): IFOBT: NEGATIVE

## 2020-03-22 ENCOUNTER — Other Ambulatory Visit: Payer: Self-pay | Admitting: Student

## 2020-03-22 DIAGNOSIS — Z1239 Encounter for other screening for malignant neoplasm of breast: Secondary | ICD-10-CM

## 2020-03-23 ENCOUNTER — Ambulatory Visit: Payer: Self-pay | Admitting: Orthopaedic Surgery

## 2020-03-27 ENCOUNTER — Other Ambulatory Visit: Payer: Self-pay | Admitting: Physician Assistant

## 2020-03-28 ENCOUNTER — Encounter: Payer: Self-pay | Admitting: Orthopaedic Surgery

## 2020-03-28 ENCOUNTER — Other Ambulatory Visit: Payer: Self-pay

## 2020-03-28 ENCOUNTER — Ambulatory Visit: Payer: Self-pay

## 2020-03-28 ENCOUNTER — Ambulatory Visit: Payer: Self-pay | Admitting: Orthopaedic Surgery

## 2020-03-28 VITALS — BP 140/92 | HR 102 | Ht 66.0 in | Wt 197.0 lb

## 2020-03-28 DIAGNOSIS — M542 Cervicalgia: Secondary | ICD-10-CM

## 2020-03-28 DIAGNOSIS — F17210 Nicotine dependence, cigarettes, uncomplicated: Secondary | ICD-10-CM

## 2020-03-28 MED ORDER — TIZANIDINE HCL 4 MG PO TABS: ORAL_TABLET | ORAL | 1 refills | Status: DC

## 2020-03-28 NOTE — Patient Instructions (Signed)
Steps to Quit Smoking Smoking tobacco is the leading cause of preventable death. It can affect almost every organ in the body. Smoking puts you and people around you at risk for many serious, long-lasting (chronic) diseases. Quitting smoking can be hard, but it is one of the best things that you can do for your health. It is never too late to quit. How do I get ready to quit? When you decide to quit smoking, make a plan to help you succeed. Before you quit:  Pick a date to quit. Set a date within the next 2 weeks to give you time to prepare.  Write down the reasons why you are quitting. Keep this list in places where you will see it often.  Tell your family, friends, and co-workers that you are quitting. Their support is important.  Talk with your doctor about the choices that may help you quit.  Find out if your health insurance will pay for these treatments.  Know the people, places, things, and activities that make you want to smoke (triggers). Avoid them. What first steps can I take to quit smoking?  Throw away all cigarettes at home, at work, and in your car.  Throw away the things that you use when you smoke, such as ashtrays and lighters.  Clean your car. Make sure to empty the ashtray.  Clean your home, including curtains and carpets. What can I do to help me quit smoking? Talk with your doctor about taking medicines and seeing a counselor at the same time. You are more likely to succeed when you do both.  If you are pregnant or breastfeeding, talk with your doctor about counseling or other ways to quit smoking. Do not take medicine to help you quit smoking unless your doctor tells you to do so. To quit smoking: Quit right away  Quit smoking totally, instead of slowly cutting back on how much you smoke over a period of time.  Go to counseling. You are more likely to quit if you go to counseling sessions regularly. Take medicine You may take medicines to help you quit. Some  medicines need a prescription, and some you can buy over-the-counter. Some medicines may contain a drug called nicotine to replace the nicotine in cigarettes. Medicines may:  Help you to stop having the desire to smoke (cravings).  Help to stop the problems that come when you stop smoking (withdrawal symptoms). Your doctor may ask you to use:  Nicotine patches, gum, or lozenges.  Nicotine inhalers or sprays.  Non-nicotine medicine that is taken by mouth. Find resources Find resources and other ways to help you quit smoking and remain smoke-free after you quit. These resources are most helpful when you use them often. They include:  Online chats with a counselor.  Phone quitlines.  Printed self-help materials.  Support groups or group counseling.  Text messaging programs.  Mobile phone apps. Use apps on your mobile phone or tablet that can help you stick to your quit plan. There are many free apps for mobile phones and tablets as well as websites. Examples include Quit Guide from the CDC and smokefree.gov  What things can I do to make it easier to quit?   Talk to your family and friends. Ask them to support and encourage you.  Call a phone quitline (1-800-QUIT-NOW), reach out to support groups, or work with a counselor.  Ask people who smoke to not smoke around you.  Avoid places that make you want to smoke,   such as: ? Bars. ? Parties. ? Smoke-break areas at work.  Spend time with people who do not smoke.  Lower the stress in your life. Stress can make you want to smoke. Try these things to help your stress: ? Getting regular exercise. ? Doing deep-breathing exercises. ? Doing yoga. ? Meditating. ? Doing a body scan. To do this, close your eyes, focus on one area of your body at a time from head to toe. Notice which parts of your body are tense. Try to relax the muscles in those areas. How will I feel when I quit smoking? Day 1 to 3 weeks Within the first 24 hours,  you may start to have some problems that come from quitting tobacco. These problems are very bad 2-3 days after you quit, but they do not often last for more than 2-3 weeks. You may get these symptoms:  Mood swings.  Feeling restless, nervous, angry, or annoyed.  Trouble concentrating.  Dizziness.  Strong desire for high-sugar foods and nicotine.  Weight gain.  Trouble pooping (constipation).  Feeling like you may vomit (nausea).  Coughing or a sore throat.  Changes in how the medicines that you take for other issues work in your body.  Depression.  Trouble sleeping (insomnia). Week 3 and afterward After the first 2-3 weeks of quitting, you may start to notice more positive results, such as:  Better sense of smell and taste.  Less coughing and sore throat.  Slower heart rate.  Lower blood pressure.  Clearer skin.  Better breathing.  Fewer sick days. Quitting smoking can be hard. Do not give up if you fail the first time. Some people need to try a few times before they succeed. Do your best to stick to your quit plan, and talk with your doctor if you have any questions or concerns. Summary  Smoking tobacco is the leading cause of preventable death. Quitting smoking can be hard, but it is one of the best things that you can do for your health.  When you decide to quit smoking, make a plan to help you succeed.  Quit smoking right away, not slowly over a period of time.  When you start quitting, seek help from your doctor, family, or friends. This information is not intended to replace advice given to you by your health care provider. Make sure you discuss any questions you have with your health care provider. Document Revised: 09/10/2019 Document Reviewed: 03/06/2019 Elsevier Patient Education  2020 Elsevier Inc.  

## 2020-03-28 NOTE — Progress Notes (Signed)
Subjective:    Patient ID: Judy Leonard, female    DOB: 1966/01/11, 54 y.o.   MRN: [id number]  HPI She has several month history of neck pain and upper back pain.  She has been seen at [facility].  She has been on Mobic with only slight help.  She has localized pain with no paresthesias. She has no trauma, no swelling, no redness, no weakness.  Heat and ice only help transiently.  She is tired of hurting.   Review of Systems  Constitutional: Positive for activity change.  Musculoskeletal: Positive for arthralgias, myalgias and neck pain.  All other systems reviewed and are negative.  For Review of Systems, all other systems reviewed and are negative.  The following is a summary of the past history medically, past history surgically, known current medicines, social history and family history.  This information is gathered electronically by the computer from prior information and documentation.  I review this each visit and have found including this information at this point in the chart is beneficial and informative.   History reviewed. No pertinent past medical history.  Past Surgical History:  Procedure Laterality Date  . APPENDECTOMY  age 89    Current Outpatient Medications on File Prior to Visit  Medication Sig Dispense Refill  . benzonatate (TESSALON) 100 MG capsule TAKE 1 TO 2 CAPSULES BY MOUTH EVERY 8 HOURS AS NEEDED FOR COUGH 20 capsule 3  . DULERA 100-5 MCG/ACT AERO INHALE 2 PUFFS BY MOUTH TWICE DAILY. RINSE MOUTH AFTER EACH USE 39 g 1  . ibuprofen (ADVIL) 200 MG tablet Take 200 mg by mouth every 6 (six) hours as needed.    . meloxicam (MOBIC) 15 MG tablet Take 1 tablet (15 mg total) by mouth daily as needed for pain. 30 tablet 3  . PROVENTIL HFA 108 (90 Base) MCG/ACT inhaler INHALE 2 PUFFS BY MOUTH EVERY 6 HOURS AS NEEDED FOR COUGHING, WHEEZING, OR SHORTNESS OF BREATH 6.7 g 1   No current facility-administered medications on file prior to visit.    Social  History   Socioeconomic History  . Marital status: Single    Spouse name: Not on file  . Number of children: Not on file  . Years of education: Not on file  . Highest education level: Not on file  Occupational History  . Not on file  Tobacco Use  . Smoking status: Light Tobacco Smoker    Packs/day: 0.25    Years: 31.00    Pack years: 7.75  . Smokeless tobacco: Never Used  . Tobacco comment: 1-2 cig months  Substance and Sexual Activity  . Alcohol use: Yes    Comment: occasionally  . Drug use: Not Currently  . Sexual activity: Not on file  Other Topics Concern  . Not on file  Social History Narrative  . Not on file   Social Determinants of Health   Financial Resource Strain:   . Difficulty of Paying Living Expenses:   Food Insecurity:   . Worried About Charity fundraiser in the Last Year:   . Arboriculturist in the Last Year:   Transportation Needs:   . Film/video editor (Medical):   Marland Kitchen Lack of Transportation (Non-Medical):   Physical Activity:   . Days of Exercise per Week:   . Minutes of Exercise per Session:   Stress:   . Feeling of Stress :   Social Connections:   . Frequency of Communication with Friends and Family:   .  Frequency of Social Gatherings with Friends and Family:   . Attends Religious Services:   . Active Member of Clubs or Organizations:   . Attends Banker Meetings:   Marland Kitchen Marital Status:   Intimate Partner Violence:   . Fear of Current or Ex-Partner:   . Emotionally Abused:   Marland Kitchen Physically Abused:   . Sexually Abused:     Family History  Problem Relation Age of Onset  . Diabetes Mother   . Kidney disease Mother   . Hypertension Mother   . Heart failure Mother     BP (!) 140/92   Pulse (!) 102   Ht 5\' 6"  (1.676 m)   Wt 197 lb (89.4 kg)   LMP 04/28/2015   BMI 31.80 kg/m   Body mass index is 31.8 kg/m.      Objective:   Physical Exam Vitals and nursing note reviewed.  Constitutional:      Appearance: She is  well-developed.  HENT:     Head: Normocephalic and atraumatic.  Eyes:     Conjunctiva/sclera: Conjunctivae normal.     Pupils: Pupils are equal, round, and reactive to light.  Cardiovascular:     Rate and Rhythm: Normal rate and regular rhythm.  Pulmonary:     Effort: Pulmonary effort is normal.  Abdominal:     Palpations: Abdomen is soft.  Musculoskeletal:       Arms:     Cervical back: Normal range of motion and neck supple.  Skin:    General: Skin is warm and dry.  Neurological:     Mental Status: She is alert and oriented to person, place, and time.     Cranial Nerves: No cranial nerve deficit.     Motor: No abnormal muscle tone.     Coordination: Coordination normal.     Deep Tendon Reflexes: Reflexes are normal and symmetric. Reflexes normal.  Psychiatric:        Behavior: Behavior normal.        Thought Content: Thought content normal.        Judgment: Judgment normal.      X-rays were done of the cervical spine, reported separately.     Assessment & Plan:   Encounter Diagnoses  Name Primary?  . Neck pain Yes  . Nicotine dependence, cigarettes, uncomplicated    I will begin PT.  I will order Zanaflex 4 po at night  Continue the Mobic  Return in three weeks.  Call if any problem.  Precautions discussed.   Electronically Signed 04/30/2015, MD 3/30/20219:44 AM

## 2020-04-04 ENCOUNTER — Ambulatory Visit (HOSPITAL_COMMUNITY): Payer: Self-pay | Admitting: Physical Therapy

## 2020-04-04 ENCOUNTER — Telehealth (HOSPITAL_COMMUNITY): Payer: Self-pay | Admitting: Physical Therapy

## 2020-04-04 NOTE — Telephone Encounter (Signed)
pt called to cx today's visit due to she has not gotten her insurance approval yet

## 2020-04-12 ENCOUNTER — Ambulatory Visit (HOSPITAL_COMMUNITY): Payer: Self-pay | Attending: Orthopaedic Surgery | Admitting: Physical Therapy

## 2020-04-12 ENCOUNTER — Other Ambulatory Visit: Payer: Self-pay

## 2020-04-12 ENCOUNTER — Encounter (HOSPITAL_COMMUNITY): Payer: Self-pay | Admitting: Physical Therapy

## 2020-04-12 DIAGNOSIS — R293 Abnormal posture: Secondary | ICD-10-CM

## 2020-04-12 DIAGNOSIS — M546 Pain in thoracic spine: Secondary | ICD-10-CM

## 2020-04-12 DIAGNOSIS — M542 Cervicalgia: Secondary | ICD-10-CM

## 2020-04-12 NOTE — Patient Instructions (Signed)
Access Code: [email or web address] URL: [email or web address] Date: 04/12/2020 Prepared by: Georges Lynch  Exercises Correct Seated Posture - 2 x daily - 7 x weekly - 2 sets - 10 reps - 5 hold Seated Scapular Retraction - 2 x daily - 7 x weekly - 2 sets - 10 reps - 5 hold Seated Cervical Retraction - 2 x daily - 7 x weekly - 2 sets - 10 reps - 5 hold

## 2020-04-12 NOTE — Therapy (Signed)
[facility] [address] [place], [place], [address] Phone: [phone]   Fax:  [phone]  Physical Therapy Evaluation  Patient Details  Name: Judy Leonard MRN: [id number] Date of Birth: Sep 10, 1966 Referring Provider (PT): Darreld Mclean MD    Encounter Date: 04/12/2020  PT End of Session - 04/12/20 1205    Visit Number  1    Number of Visits  18    Date for PT Re-Evaluation  05/26/20    Authorization Type  Self Pay (Waiting for CAFA approval)    Authorization Time Period  04/12/20-05/26/20    Progress Note Due on Visit  10    PT Start Time  1115    PT Stop Time  1200    PT Time Calculation (min)  45 min    Activity Tolerance  Patient limited by pain    Behavior During Therapy  [facility] for tasks assessed/performed       History reviewed. No pertinent past medical history.  Past Surgical History:  Procedure Laterality Date  . APPENDECTOMY  age 54    There were no vitals filed for this visit.   Subjective Assessment - 04/12/20 1123    Subjective  Patient presents to physical therapy with complaint of neck and periscapular pain. Patient says this pain began about 2 years ago but has gotten progressively worse over time. Patient denies mechanism of injury. Patient says she has been to MD and has been prescribed muscle relaxer which she says puts her to sleep. Patient has had recent xrays of neck which showed slight degenerative changes of cervical spine.    Limitations  Sitting;Reading    Diagnostic tests  xrays    Patient Stated Goals  feel better    Currently in Pain?  Yes    Pain Score  6     Pain Location  Neck    Pain Orientation  Posterior;Right;Left;Lower    Pain Descriptors / Indicators  Sore;Aching    Pain Type  Chronic pain    Pain Radiating Towards  occasional radiating into LT shoulder    Pain Onset  More than a month ago    Pain Frequency  Constant    Aggravating Factors   looking down, sitting too long,  turning head, laying in one spot, standing too long, lifting    Pain Relieving Factors  heating pad, meds    Effect of Pain on Daily Activities  limits         [facility] PT Assessment - 04/12/20 0001      Assessment   Medical Diagnosis  Neck pain     Referring Provider (PT)  Darreld Mclean MD     Onset Date/Surgical Date  --   Chronic, over 2 years   Next MD Visit  04/18/20    Prior Therapy  No      Precautions   Precautions  None      Restrictions   Weight Bearing Restrictions  No      Balance Screen   Has the patient fallen in the past 6 months  Yes   Says she has some trouble with her LT hip occasionally   How many times?  2    Has the patient had a decrease in activity level because of a fear of falling?   Yes    Is the patient reluctant to leave their home because of a fear of falling?   Yes      Home Environment  Living Environment  Private residence      Prior Function   Level of Independence  Independent      Cognition   Overall Cognitive Status  Within Functional Limits for tasks assessed      Observation/Other Assessments   Focus on Therapeutic Outcomes (FOTO)   52% limited       Sensation   Light Touch  Appears Intact   reports intermittent numbness in LUE      Posture/Postural Control   Posture/Postural Control  Postural limitations    Postural Limitations  Rounded Shoulders;Forward head      ROM / Strength   AROM / PROM / Strength  AROM;Strength      AROM   AROM Assessment Site  Cervical    Cervical Flexion  33    Cervical Extension  34    Cervical - Right Side Bend  25   pain   Cervical - Left Side Bend  25   pain   Cervical - Right Rotation  70    Cervical - Left Rotation  55   pain     Strength   Strength Assessment Site  Shoulder    Right/Left Shoulder  Right;Left    Right Shoulder Flexion  4/5    Right Shoulder ABduction  4/5    Right Shoulder External Rotation  5/5    Left Shoulder Flexion  4/5   pain   Left Shoulder ABduction   4/5   pain   Left Shoulder External Rotation  5/5      Palpation   Palpation comment  Mod max tenderness to palpation about RT upper trap, levator, lower scapular border. Mod TTP about LT upper trap, lower scapular border                Objective measurements completed on examination: See above findings.      [facility] Adult PT Treatment/Exercise - 04/12/20 0001      Exercises   Exercises  Neck;Shoulder      Neck Exercises: Seated   Neck Retraction  5 reps;5 secs      Shoulder Exercises: Seated   Retraction  Both;5 reps    Other Seated Exercises  seated posture correction 5 x 5" hold             PT Education - 04/12/20 1127    Education Details  on evaluation findings, POC and HEP    Person(s) Educated  Patient    Methods  Explanation    Comprehension  Verbalized understanding       PT Short Term Goals - 04/12/20 1209      PT SHORT TERM GOAL #1   Title  Patient will be independent with initial HEP to improve functional outcomes    Time  3    Period  Weeks    Status  New    Target Date  05/05/20      PT SHORT TERM GOAL #2   Title  Patient will report at least 50% overall improvement in subjective complaint to indicate improvement in ability to perform ADLs.    Time  3    Period  Weeks    Status  New    Target Date  05/05/20        PT Long Term Goals - 04/12/20 1210      PT LONG TERM GOAL #1   Title  Patient will report at least 75% overall improvement in subjective complaint to indicate improvement in ability to  perform ADLs.    Time  6    Period  Weeks    Status  New    Target Date  05/26/20      PT LONG TERM GOAL #2   Title  Patient will improve FOTO score to <40% to indicate improvement in functional outcomes    Time  6    Period  Weeks    Status  New    Target Date  05/26/20      PT LONG TERM GOAL #3   Title  Patient will have equal to or > 4+/5 MMT throughout BUE to improve ability to perform UE ADLs/ dressing/ grooming tasks.     Time  6    Period  Weeks    Status  New    Target Date  05/26/20      PT LONG TERM GOAL #4   Title  Patient will improve LT cervical rotation to within 5 degrees of contralateral side to improve ability to scan environment for safety and while driving.    Time  6    Period  Weeks    Status  New    Target Date  05/26/20             Plan - 04/12/20 1206    Clinical Impression Statement  Patient is a 54 y.o. female who presents to physical therapy with complaint of neck and mid back pain. Patient demonstrates decreased strength, ROM restriction, increased tenderness to palpation and postural abnormalities which are likely contributing to symptoms of pain and are negatively impacting patient ability to perform ADLs. Patient will benefit from skilled physical therapy services to address these deficits to reduce pain and improve level of function with ADLs    Examination-Activity Limitations  Lift;Bend;Reach Overhead;Caring for Others;Carry;Sit;Dressing;Sleep;Hygiene/Grooming    Examination-Participation Restrictions  Yard Work;Cleaning;Community Activity;Driving;Laundry    Stability/Clinical Decision Making  Stable/Uncomplicated    Clinical Decision Making  Low    Rehab Potential  Good    PT Frequency  3x / week    PT Duration  6 weeks    PT Treatment/Interventions  ADLs/Self Care Home Management;Aquatic Therapy;Biofeedback;Cryotherapy;Electrical Stimulation;Contrast Bath;Therapeutic exercise;Therapeutic activities;Orthotic Fit/Training;Patient/family education;Fluidtherapy;Parrafin;Ultrasound;Traction;Moist Heat;Functional mobility training;Stair training;Neuromuscular re-education;Balance training;DME Instruction;Gait training;Iontophoresis 4mg /ml Dexamethasone;Manual techniques;Manual lymph drainage;Compression bandaging;Vasopneumatic Device;Taping;Splinting;Joint Manipulations;Spinal Manipulations;Energy conservation;Dry needling;Passive range of motion    PT Next Visit Plan  Review  goals and HEP. Progress cervical and thoracic mobility, as wel las postural strengthening as tolerated. Add 3D cervical excursions next visit. Manual PRN for muscle restriction and pain    PT Home Exercise Plan  04/12/20: posture correction, scap retraction, chin tuck    Consulted and Agree with Plan of Care  Patient       Patient will benefit from skilled therapeutic intervention in order to improve the following deficits and impairments:  Increased fascial restricitons, Improper body mechanics, Pain, Decreased mobility, Increased muscle spasms, Postural dysfunction, Decreased activity tolerance, Decreased range of motion, Decreased strength, Hypomobility  Visit Diagnosis: Cervicalgia  Pain in thoracic spine  Abnormal posture     Problem List Patient Active Problem List   Diagnosis Date Noted  . Tobacco use disorder 04/20/2019   12:14 PM, 04/12/20 04/14/20 PT DPT  Physical Therapist with [place]  [facility]  [phone]   [facility] [address] [place], [place], [place] Phone: [phone]   Fax:  [phone]  Name: Bernie Fobes. Godbey MRN: Wyline Mood Date of Birth: [birth year]

## 2020-04-17 ENCOUNTER — Ambulatory Visit (HOSPITAL_COMMUNITY): Payer: Self-pay | Admitting: Physical Therapy

## 2020-04-17 ENCOUNTER — Encounter (HOSPITAL_COMMUNITY): Payer: Self-pay | Admitting: Physical Therapy

## 2020-04-17 ENCOUNTER — Other Ambulatory Visit: Payer: Self-pay

## 2020-04-17 DIAGNOSIS — R293 Abnormal posture: Secondary | ICD-10-CM

## 2020-04-17 DIAGNOSIS — M542 Cervicalgia: Secondary | ICD-10-CM

## 2020-04-17 DIAGNOSIS — M546 Pain in thoracic spine: Secondary | ICD-10-CM

## 2020-04-17 NOTE — Therapy (Signed)
[place], [place], [address] Phone: [phone]   Fax:  [phone]  Physical Therapy Treatment  Patient Details  Name: Judy Leonard MRN: [id number] Date of Birth: 26-Oct-1966 Referring Provider (PT): Sanjuana Kava MD    Encounter Date: 04/17/2020  PT End of Session - 04/17/20 1404    Visit Number  2    Number of Visits  18    Date for PT Re-Evaluation  05/26/20    Authorization Type  Self Pay (Waiting for CAFA approval)    Authorization Time Period  04/12/20-05/26/20    Progress Note Due on Visit  10    PT Start Time  1404    PT Stop Time  1444    PT Time Calculation (min)  40 min    Activity Tolerance  Patient limited by pain    Behavior During Therapy  [facility] for tasks assessed/performed       History reviewed. No pertinent past medical history.  Past Surgical History:  Procedure Laterality Date  . APPENDECTOMY  age 59    There were no vitals filed for this visit.  Subjective Assessment - 04/17/20 1406    Subjective  States that the pain is about the same has been doing the exercises and getting a lot of popping. States a lot of soreness.    Currently in Pain?  Yes    Pain Score  5     Pain Location  Neck    Pain Orientation  Left;Right    Pain Descriptors / Indicators  Aching;Sore    Pain Type  Chronic pain         [facility] PT Assessment - 04/17/20 0001      Assessment   Medical Diagnosis  Neck pain     Referring Provider (PT)  Sanjuana Kava MD                    [facility] Adult PT Treatment/Exercise - 04/17/20 0001      Neck Exercises: Seated   Neck Retraction  15 reps;5 secs   reviewed - patient was doing it with neck flexion.    Other Seated Exercise  neck rolls clockwise and counter closkwise 3x12 Each    Other Seated Exercise  side bending 2x5. B       Neck Exercises: Supine   Other Supine Exercise  Towel roll down spine - not tolerated well       Shoulder Exercises:  Seated   Retraction  AROM;15 reps   5" holds - cues for form. (tends to flex neck)     Manual Therapy   Manual Therapy  Soft tissue mobilization    Manual therapy comments  all interventions performed independently of other interventions.     Soft tissue mobilization  gentle STM to UT-B - Not tolerate manual   - stopped             PT Education - 04/17/20 1433    Education Details  educated patient in self mobilization, HEP, on resting posture (sits and lays with shoulders elevated. )    Person(s) Educated  Patient    Methods  Explanation    Comprehension  Verbalized understanding       PT Short Term Goals - 04/12/20 1209      PT SHORT TERM GOAL #1   Title  Patient will be independent with initial HEP to improve functional outcomes    Time  3  Period  Weeks    Status  New    Target Date  05/05/20      PT SHORT TERM GOAL #2   Title  Patient will report at least 50% overall improvement in subjective complaint to indicate improvement in ability to perform ADLs.    Time  3    Period  Weeks    Status  New    Target Date  05/05/20        PT Long Term Goals - 04/12/20 1210      PT LONG TERM GOAL #1   Title  Patient will report at least 75% overall improvement in subjective complaint to indicate improvement in ability to perform ADLs.    Time  6    Period  Weeks    Status  New    Target Date  05/26/20      PT LONG TERM GOAL #2   Title  Patient will improve FOTO score to <40% to indicate improvement in functional outcomes    Time  6    Period  Weeks    Status  New    Target Date  05/26/20      PT LONG TERM GOAL #3   Title  Patient will have equal to or > 4+/5 MMT throughout BUE to improve ability to perform UE ADLs/ dressing/ grooming tasks.    Time  6    Period  Weeks    Status  New    Target Date  05/26/20      PT LONG TERM GOAL #4   Title  Patient will improve LT cervical rotation to within 5 degrees of contralateral side to improve ability to scan  environment for safety and while driving.    Time  6    Period  Weeks    Status  New    Target Date  05/26/20            Plan - 04/17/20 1423    Clinical Impression Statement  Session tolerated fair. Patient did not tolerate manual, percussion gun or supine position. Reviewed HEP and these required verbal cues for form. Added self-mobilization with tennis ball with light pressure, this was tolerated the best but standing started to bother her back. Added cervical ROM and educated patient on not holding breath as she has a tendency to hold breath and hold shoulders in elevated position.    Examination-Activity Limitations  Lift;Bend;Reach Overhead;Caring for Others;Carry;Sit;Dressing;Sleep;Hygiene/Grooming    Examination-Participation Restrictions  Yard Work;Cleaning;Community Activity;Driving;Laundry    Stability/Clinical Decision Making  Stable/Uncomplicated    Rehab Potential  Good    PT Frequency  3x / week    PT Duration  6 weeks    PT Treatment/Interventions  ADLs/Self Care Home Management;Aquatic Therapy;Biofeedback;Cryotherapy;Electrical Stimulation;Contrast Bath;Therapeutic exercise;Therapeutic activities;Orthotic Fit/Training;Patient/family education;Fluidtherapy;Parrafin;Ultrasound;Traction;Moist Heat;Functional mobility training;Stair training;Neuromuscular re-education;Balance training;DME Instruction;Gait training;Iontophoresis 4mg /ml Dexamethasone;Manual techniques;Manual lymph drainage;Compression bandaging;Vasopneumatic Device;Taping;Splinting;Joint Manipulations;Spinal Manipulations;Energy conservation;Dry needling;Passive range of motion    PT Next Visit Plan  Progress cervical and thoracic mobility, as well as postural strengthening as tolerated. Add 3D cervical excursions    PT Home Exercise Plan  04/12/20: posture correction, scap retraction, chin tuck; 4/19 neck rolls, self STM with tennis ball    Consulted and Agree with Plan of Care  Patient       Patient will  benefit from skilled therapeutic intervention in order to improve the following deficits and impairments:  Increased fascial restricitons, Improper body mechanics, Pain, Decreased mobility, Increased muscle spasms, Postural dysfunction, Decreased activity tolerance,  Decreased range of motion, Decreased strength, Hypomobility  Visit Diagnosis: Cervicalgia  Pain in thoracic spine  Abnormal posture     Problem List Patient Active Problem List   Diagnosis Date Noted  . Tobacco use disorder 04/20/2019    2:46 PM, 04/17/20 Tereasa Coop, DPT Physical Therapy with [facility]  [phone] office  [facility] [address] [place], [place], [address] Phone: [phone]   Fax:  [phone]  Name: Rosalva Neary. Briggs MRN: [id number] Date of Birth: Apr 23, 1966

## 2020-04-18 ENCOUNTER — Encounter: Payer: Self-pay | Admitting: Orthopaedic Surgery

## 2020-04-18 ENCOUNTER — Ambulatory Visit (INDEPENDENT_AMBULATORY_CARE_PROVIDER_SITE_OTHER): Payer: Self-pay | Admitting: Orthopaedic Surgery

## 2020-04-18 VITALS — BP 144/94 | HR 86 | Ht 66.0 in | Wt 200.0 lb

## 2020-04-18 DIAGNOSIS — M542 Cervicalgia: Secondary | ICD-10-CM

## 2020-04-18 DIAGNOSIS — F17210 Nicotine dependence, cigarettes, uncomplicated: Secondary | ICD-10-CM

## 2020-04-18 NOTE — Progress Notes (Signed)
Patient Judy Leonard A. Wrightson, female DOB:June 10, 1966, 54 y.o. XTG:[id number]  Chief Complaint  Patient presents with  . Neck Pain    HPI  Judy Leonard is a 54 y.o. female who has neck pain that is getting better with PT.  I have read the PT notes.  She has pain still but not as severe and with no spasm or paresthesias.  The neck has better motion.  She is taking her Mobic.   Body mass index is 32.28 kg/m.  ROS  Review of Systems  Constitutional: Positive for activity change.  Musculoskeletal: Positive for arthralgias, myalgias and neck pain.  All other systems reviewed and are negative.   All other systems reviewed and are negative.  The following is a summary of the past history medically, past history surgically, known current medicines, social history and family history.  This information is gathered electronically by the computer from prior information and documentation.  I review this each visit and have found including this information at this point in the chart is beneficial and informative.    History reviewed. No pertinent past medical history.  Past Surgical History:  Procedure Laterality Date  . APPENDECTOMY  age 59    Family History  Problem Relation Age of Onset  . Diabetes Mother   . Kidney disease Mother   . Hypertension Mother   . Heart failure Mother     Social History Social History   Tobacco Use  . Smoking status: Light Tobacco Smoker    Packs/day: 0.25    Years: 31.00    Pack years: 7.75  . Smokeless tobacco: Never Used  . Tobacco comment: 1-2 cig months  Substance Use Topics  . Alcohol use: Yes    Comment: occasionally  . Drug use: Not Currently    No Known Allergies  Current Outpatient Medications  Medication Sig Dispense Refill  . benzonatate (TESSALON) 100 MG capsule TAKE 1 TO 2 CAPSULES BY MOUTH EVERY 8 HOURS AS NEEDED FOR COUGH 20 capsule 3  . DULERA 100-5 MCG/ACT AERO INHALE 2 PUFFS BY MOUTH TWICE DAILY. RINSE MOUTH AFTER  EACH USE 39 g 1  . ibuprofen (ADVIL) 200 MG tablet Take 200 mg by mouth every 6 (six) hours as needed.    . meloxicam (MOBIC) 15 MG tablet Take 1 tablet (15 mg total) by mouth daily as needed for pain. 30 tablet 3  . PROVENTIL HFA 108 (90 Base) MCG/ACT inhaler INHALE 2 PUFFS BY MOUTH EVERY 6 HOURS AS NEEDED FOR COUGHING, WHEEZING, OR SHORTNESS OF BREATH 6.7 g 1  . tiZANidine (ZANAFLEX) 4 MG tablet One by mouth every night before bed as needed for spasm 30 tablet 1   No current facility-administered medications for this visit.     Physical Exam  Blood pressure (!) 144/94, pulse 86, height 5\' 6"  (1.676 m), weight 200 lb (90.7 kg), last menstrual period 04/28/2015.  Constitutional: overall normal hygiene, normal nutrition, well developed, normal grooming, normal body habitus. Assistive device:none  Musculoskeletal: gait and station Limp none, muscle tone and strength are normal, no tremors or atrophy is present.  .  Neurological: coordination overall normal.  Deep tendon reflex/nerve stretch intact.  Sensation normal.  Cranial nerves II-XII intact.   Skin:   Normal overall no scars, lesions, ulcers or rashes. No psoriasis.  Psychiatric: Alert and oriented x 3.  Recent memory intact, remote memory unclear.  Normal mood and affect. Well groomed.  Good eye contact.  Cardiovascular: overall no swelling, no varicosities, no edema  bilaterally, normal temperatures of the legs and arms, no clubbing, cyanosis and good capillary refill.  Lymphatic: palpation is normal.  Neck has full ROM and NV intact.  Grips normal.  All other systems reviewed and are negative   The patient has been educated about the nature of the problem(s) and counseled on treatment options.  The patient appeared to understand what I have discussed and is in agreement with it.  Encounter Diagnoses  Name Primary?  . Neck pain Yes  . Nicotine dependence, cigarettes, uncomplicated     PLAN Call if any problems.   Precautions discussed.  Continue current medications.   Return to clinic 2 weeks   Continue PT.  Electronically Signed Sanjuana Kava, MD 4/20/20218:12 AM

## 2020-04-18 NOTE — Patient Instructions (Signed)
Steps to Quit Smoking Smoking tobacco is the leading cause of preventable death. It can affect almost every organ in the body. Smoking puts you and people around you at risk for many serious, long-lasting (chronic) diseases. Quitting smoking can be hard, but it is one of the best things that you can do for your health. It is never too late to quit. How do I get ready to quit? When you decide to quit smoking, make a plan to help you succeed. Before you quit:  Pick a date to quit. Set a date within the next 2 weeks to give you time to prepare.  Write down the reasons why you are quitting. Keep this list in places where you will see it often.  Tell your family, friends, and co-workers that you are quitting. Their support is important.  Talk with your doctor about the choices that may help you quit.  Find out if your health insurance will pay for these treatments.  Know the people, places, things, and activities that make you want to smoke (triggers). Avoid them. What first steps can I take to quit smoking?  Throw away all cigarettes at home, at work, and in your car.  Throw away the things that you use when you smoke, such as ashtrays and lighters.  Clean your car. Make sure to empty the ashtray.  Clean your home, including curtains and carpets. What can I do to help me quit smoking? Talk with your doctor about taking medicines and seeing a counselor at the same time. You are more likely to succeed when you do both.  If you are pregnant or breastfeeding, talk with your doctor about counseling or other ways to quit smoking. Do not take medicine to help you quit smoking unless your doctor tells you to do so. To quit smoking: Quit right away  Quit smoking totally, instead of slowly cutting back on how much you smoke over a period of time.  Go to counseling. You are more likely to quit if you go to counseling sessions regularly. Take medicine You may take medicines to help you quit. Some  medicines need a prescription, and some you can buy over-the-counter. Some medicines may contain a drug called nicotine to replace the nicotine in cigarettes. Medicines may:  Help you to stop having the desire to smoke (cravings).  Help to stop the problems that come when you stop smoking (withdrawal symptoms). Your doctor may ask you to use:  Nicotine patches, gum, or lozenges.  Nicotine inhalers or sprays.  Non-nicotine medicine that is taken by mouth. Find resources Find resources and other ways to help you quit smoking and remain smoke-free after you quit. These resources are most helpful when you use them often. They include:  Online chats with a counselor.  Phone quitlines.  Printed self-help materials.  Support groups or group counseling.  Text messaging programs.  Mobile phone apps. Use apps on your mobile phone or tablet that can help you stick to your quit plan. There are many free apps for mobile phones and tablets as well as websites. Examples include Quit Guide from the CDC and smokefree.gov  What things can I do to make it easier to quit?   Talk to your family and friends. Ask them to support and encourage you.  Call a phone quitline (1-800-QUIT-NOW), reach out to support groups, or work with a counselor.  Ask people who smoke to not smoke around you.  Avoid places that make you want to smoke,   such as: ? Bars. ? Parties. ? Smoke-break areas at work.  Spend time with people who do not smoke.  Lower the stress in your life. Stress can make you want to smoke. Try these things to help your stress: ? Getting regular exercise. ? Doing deep-breathing exercises. ? Doing yoga. ? Meditating. ? Doing a body scan. To do this, close your eyes, focus on one area of your body at a time from head to toe. Notice which parts of your body are tense. Try to relax the muscles in those areas. How will I feel when I quit smoking? Day 1 to 3 weeks Within the first 24 hours,  you may start to have some problems that come from quitting tobacco. These problems are very bad 2-3 days after you quit, but they do not often last for more than 2-3 weeks. You may get these symptoms:  Mood swings.  Feeling restless, nervous, angry, or annoyed.  Trouble concentrating.  Dizziness.  Strong desire for high-sugar foods and nicotine.  Weight gain.  Trouble pooping (constipation).  Feeling like you may vomit (nausea).  Coughing or a sore throat.  Changes in how the medicines that you take for other issues work in your body.  Depression.  Trouble sleeping (insomnia). Week 3 and afterward After the first 2-3 weeks of quitting, you may start to notice more positive results, such as:  Better sense of smell and taste.  Less coughing and sore throat.  Slower heart rate.  Lower blood pressure.  Clearer skin.  Better breathing.  Fewer sick days. Quitting smoking can be hard. Do not give up if you fail the first time. Some people need to try a few times before they succeed. Do your best to stick to your quit plan, and talk with your doctor if you have any questions or concerns. Summary  Smoking tobacco is the leading cause of preventable death. Quitting smoking can be hard, but it is one of the best things that you can do for your health.  When you decide to quit smoking, make a plan to help you succeed.  Quit smoking right away, not slowly over a period of time.  When you start quitting, seek help from your doctor, family, or friends. This information is not intended to replace advice given to you by your health care provider. Make sure you discuss any questions you have with your health care provider. Document Revised: 09/10/2019 Document Reviewed: 03/06/2019 Elsevier Patient Education  2020 Elsevier Inc.  

## 2020-04-19 ENCOUNTER — Ambulatory Visit (HOSPITAL_COMMUNITY): Payer: Self-pay | Admitting: Physical Therapy

## 2020-04-21 ENCOUNTER — Other Ambulatory Visit: Payer: Self-pay

## 2020-04-21 ENCOUNTER — Encounter (HOSPITAL_COMMUNITY): Payer: Self-pay

## 2020-04-21 ENCOUNTER — Ambulatory Visit (HOSPITAL_COMMUNITY): Payer: Self-pay

## 2020-04-21 DIAGNOSIS — M542 Cervicalgia: Secondary | ICD-10-CM

## 2020-04-21 DIAGNOSIS — R293 Abnormal posture: Secondary | ICD-10-CM

## 2020-04-21 DIAGNOSIS — M546 Pain in thoracic spine: Secondary | ICD-10-CM

## 2020-04-21 NOTE — Patient Instructions (Signed)
Breathing    Sit or stand with body upright and naturally aligned. Focus on movement of body with breathing. Notice expansion with inhale, release with exhale. Repeat throughout your day.  Copyright  VHI. All rights reserved.

## 2020-04-21 NOTE — Therapy (Signed)
[place], [place], [address] Phone: [phone]   Fax:  [phone]  Physical Therapy Treatment  Patient Details  Name: Judy Leonard MRN: [id number] Date of Birth: [birth year] Referring Provider (PT): Sanjuana Kava MD    Encounter Date: 04/21/2020  PT End of Session - 04/21/20 0832    Visit Number  3    Number of Visits  18    Date for PT Re-Evaluation  05/26/20    Authorization Type  Self Pay (Waiting for CAFA approval)    Authorization Time Period  04/12/20-05/26/20    Progress Note Due on Visit  10    PT Start Time  0827    PT Stop Time  0907    PT Time Calculation (min)  40 min    Activity Tolerance  Patient limited by pain    Behavior During Therapy  [facility] for tasks assessed/performed       History reviewed. No pertinent past medical history.  Past Surgical History:  Procedure Laterality Date  . APPENDECTOMY  age 55    There were no vitals filed for this visit.  Subjective Assessment - 04/21/20 0829    Subjective  Pt reports she had improved sleep and feels more mobile, current pain scale 6/10 posterior left neck, nagging.    Patient Stated Goals  feel better    Currently in Pain?  Yes    Pain Score  6     Pain Location  Neck    Pain Orientation  Posterior;Left    Pain Descriptors / Indicators  Nagging;Aching;Tightness    Pain Type  Chronic pain    Pain Onset  More than a month ago    Pain Frequency  Intermittent    Aggravating Factors   looking down, sitting too long, turning head, laying in one spot, standing too long, lifting    Pain Relieving Factors  heating pad, meds    Effect of Pain on Daily Activities  limits         [facility] PT Assessment - 04/21/20 0001      Assessment   Medical Diagnosis  Neck pain     Referring Provider (PT)  Sanjuana Kava MD     Onset Date/Surgical Date  --   Chronic, over 2 years   Next MD Visit  05/02/20                   [facility] Adult PT  Treatment/Exercise - 04/21/20 0001      Posture/Postural Control   Posture/Postural Control  Postural limitations    Postural Limitations  Rounded Shoulders;Forward head      Exercises   Exercises  Neck      Neck Exercises: Standing   Neck Retraction  10 reps;3 secs    Neck Retraction Limitations  towel for proprioception      Neck Exercises: Seated   Neck Retraction  15 reps;5 secs    Neck Retraction Limitations  reviewed form, tendency to flex neck    Cervical Rotation  10 reps    Cervical Rotation Limitations  3D cervical excursion slow pain free range    Shoulder Rolls  Backwards;10 reps    Shoulder Rolls Limitations  up, back and down    Postural Training  Educated importance    Other Seated Exercise  neck rolls clockwise and counter closkwise; UT stretch 2x 30" each    Other Seated Exercise  deep breathing; scapular retraction 15x  Neck Exercises: Stretches   Upper Trapezius Stretch  2 reps;30 seconds;Right;Left               PT Short Term Goals - 04/12/20 1209      PT SHORT TERM GOAL #1   Title  Patient will be independent with initial HEP to improve functional outcomes    Time  3    Period  Weeks    Status  New    Target Date  05/05/20      PT SHORT TERM GOAL #2   Title  Patient will report at least 50% overall improvement in subjective complaint to indicate improvement in ability to perform ADLs.    Time  3    Period  Weeks    Status  New    Target Date  05/05/20        PT Long Term Goals - 04/12/20 1210      PT LONG TERM GOAL #1   Title  Patient will report at least 75% overall improvement in subjective complaint to indicate improvement in ability to perform ADLs.    Time  6    Period  Weeks    Status  New    Target Date  05/26/20      PT LONG TERM GOAL #2   Title  Patient will improve FOTO score to <40% to indicate improvement in functional outcomes    Time  6    Period  Weeks    Status  New    Target Date  05/26/20      PT LONG  TERM GOAL #3   Title  Patient will have equal to or > 4+/5 MMT throughout BUE to improve ability to perform UE ADLs/ dressing/ grooming tasks.    Time  6    Period  Weeks    Status  New    Target Date  05/26/20      PT LONG TERM GOAL #4   Title  Patient will improve LT cervical rotation to within 5 degrees of contralateral side to improve ability to scan environment for safety and while driving.    Time  6    Period  Weeks    Status  New    Target Date  05/26/20            Plan - 04/21/20 0858    Clinical Impression Statement  Session focus with cervical mobility and postural strengthening for pain control.  Pt with tendency to hold breath with shoulder in elevated position.  Educated importance of deep breathing for stress relief and to reduce cervical mm tightness.  Pt required some cueing to improve cervical retraction and reduce flexion, improved mechanics noted with isometric chin tucks against wall.  EOS pt reports decreased nagging and increased cervical mobility.  Reviewed HEP with ability to demonstrate good form and mechanics.    Examination-Activity Limitations  Lift;Bend;Reach Overhead;Caring for Others;Carry;Sit;Dressing;Sleep;Hygiene/Grooming    Examination-Participation Restrictions  Yard Work;Cleaning;Community Activity;Driving;Laundry    Stability/Clinical Decision Making  Stable/Uncomplicated    Clinical Decision Making  Low    Rehab Potential  Good    PT Frequency  3x / week    PT Duration  6 weeks    PT Treatment/Interventions  ADLs/Self Care Home Management;Aquatic Therapy;Biofeedback;Cryotherapy;Electrical Stimulation;Contrast Bath;Therapeutic exercise;Therapeutic activities;Orthotic Fit/Training;Patient/family education;Fluidtherapy;Parrafin;Ultrasound;Traction;Moist Heat;Functional mobility training;Stair training;Neuromuscular re-education;Balance training;DME Instruction;Gait training;Iontophoresis 4mg /ml Dexamethasone;Manual techniques;Manual lymph  drainage;Compression bandaging;Vasopneumatic Device;Taping;Splinting;Joint Manipulations;Spinal Manipulations;Energy conservation;Dry needling;Passive range of motion    PT Next Visit Plan  Progress cervical  and thoracic mobility, as well as postural strengthening as tolerated.    PT Home Exercise Plan  04/12/20: posture correction, scap retraction, chin tuck; 4/19 neck rolls, self STM with tennis ball; 4/23:  deep breathing       Patient will benefit from skilled therapeutic intervention in order to improve the following deficits and impairments:  Increased fascial restricitons, Improper body mechanics, Pain, Decreased mobility, Increased muscle spasms, Postural dysfunction, Decreased activity tolerance, Decreased range of motion, Decreased strength, Hypomobility  Visit Diagnosis: Cervicalgia  Pain in thoracic spine  Abnormal posture     Problem List Patient Active Problem List   Diagnosis Date Noted  . Tobacco use disorder 04/20/2019   Becky Sax, LPTA/CLT; CBIS [phone]  Juel Burrow 04/21/2020, 3:06 PM  [facility] [address] [place], [place], [address] Phone: [phone]   Fax:  [phone]  Name: Judy Leonard MRN: [id number] Date of Birth: [birth year]

## 2020-04-24 ENCOUNTER — Ambulatory Visit (HOSPITAL_COMMUNITY): Payer: Self-pay | Admitting: Physical Therapy

## 2020-04-24 ENCOUNTER — Telehealth (HOSPITAL_COMMUNITY): Payer: Self-pay | Admitting: Physical Therapy

## 2020-04-24 NOTE — Telephone Encounter (Signed)
pt thinks someone who came into her home may have covid but she is waiting for their results.

## 2020-04-26 ENCOUNTER — Other Ambulatory Visit: Payer: Self-pay

## 2020-04-26 ENCOUNTER — Encounter (HOSPITAL_COMMUNITY): Payer: Self-pay | Admitting: Physical Therapy

## 2020-04-26 ENCOUNTER — Ambulatory Visit (HOSPITAL_COMMUNITY): Payer: Self-pay | Admitting: Physical Therapy

## 2020-04-26 DIAGNOSIS — M546 Pain in thoracic spine: Secondary | ICD-10-CM

## 2020-04-26 DIAGNOSIS — M542 Cervicalgia: Secondary | ICD-10-CM

## 2020-04-26 DIAGNOSIS — R293 Abnormal posture: Secondary | ICD-10-CM

## 2020-04-26 NOTE — Therapy (Signed)
[place], [place], [address] Phone: [phone]   Fax:  [phone]  Physical Therapy Treatment  Patient Details  Name: Judy Leonard MRN: [id number] Date of Birth: [birth year] Referring Provider (PT): Sanjuana Kava MD    Encounter Date: 04/26/2020  PT End of Session - 04/26/20 1446    Visit Number  4    Number of Visits  18    Date for PT Re-Evaluation  05/26/20    Authorization Type  Self Pay (Waiting for CAFA approval)    Authorization Time Period  04/12/20-05/26/20    Progress Note Due on Visit  10    PT Start Time  1446    PT Stop Time  1526    PT Time Calculation (min)  40 min    Activity Tolerance  Patient limited by pain    Behavior During Therapy  [facility] for tasks assessed/performed       History reviewed. No pertinent past medical history.  Past Surgical History:  Procedure Laterality Date  . APPENDECTOMY  age 54    There were no vitals filed for this visit.  Subjective Assessment - 04/26/20 1456    Subjective  States she has been doing her exercises and they seem to help. States she is sleeping a little better. Current pain 5/10 in her neck about. States she has been doing her ball exercise and that helps.    Patient Stated Goals  feel better    Currently in Pain?  Yes    Pain Score  5     Pain Location  Neck    Pain Orientation  Posterior;Left;Lower    Pain Onset  More than a month ago         [facility] PT Assessment - 04/26/20 0001      Assessment   Medical Diagnosis  Neck pain     Referring Provider (PT)  Sanjuana Kava MD     Next MD Visit  05/02/20                   [place] Adult PT Treatment/Exercise - 04/26/20 0001      Neck Exercises: Standing   Other Standing Exercises  towel roll down spine w's x15 5" holds       Neck Exercises: Seated   Other Seated Exercise  seated scapular protraction and retraction - with hands laced at knees 2x10, 5" holds    Other Seated  Exercise  seated on exercise ball - posture exercises 6 minutes       Neck Exercises: Supine   Other Supine Exercise  attempted supine 90/90 with HOB elevated  - could not tolerate, attempted prone - could not tolerate.       Neck Exercises: Sidelying   Other Sidelying Exercise  R S/L book stretch - gentle - breathing throughout to tolerance - 6 minutes total - could not tolerate left side lying               PT Short Term Goals - 04/12/20 1209      PT SHORT TERM GOAL #1   Title  Patient will be independent with initial HEP to improve functional outcomes    Time  3    Period  Weeks    Status  New    Target Date  05/05/20      PT SHORT TERM GOAL #2   Title  Patient will report at least 50% overall improvement in subjective  complaint to indicate improvement in ability to perform ADLs.    Time  3    Period  Weeks    Status  New    Target Date  05/05/20        PT Long Term Goals - 04/12/20 1210      PT LONG TERM GOAL #1   Title  Patient will report at least 75% overall improvement in subjective complaint to indicate improvement in ability to perform ADLs.    Time  6    Period  Weeks    Status  New    Target Date  05/26/20      PT LONG TERM GOAL #2   Title  Patient will improve FOTO score to <40% to indicate improvement in functional outcomes    Time  6    Period  Weeks    Status  New    Target Date  05/26/20      PT LONG TERM GOAL #3   Title  Patient will have equal to or > 4+/5 MMT throughout BUE to improve ability to perform UE ADLs/ dressing/ grooming tasks.    Time  6    Period  Weeks    Status  New    Target Date  05/26/20      PT LONG TERM GOAL #4   Title  Patient will improve LT cervical rotation to within 5 degrees of contralateral side to improve ability to scan environment for safety and while driving.    Time  6    Period  Weeks    Status  New    Target Date  05/26/20            Plan - 04/26/20 1457    Clinical Impression Statement   Attempted supine 90/90 and prone exercises but these were not tolerated secondary to pain and breathing difficulties. able to tolerate right side lying exercise and this seemed to help with patient's symptoms. Seated on ball fatigued patient's legs but did not increase symptoms, will reattempt in future sessions. Will continue to add in scapular, neck and thoracic mobility as tolerated and work on pain management strategies.    Examination-Activity Limitations  Lift;Bend;Reach Overhead;Caring for Others;Carry;Sit;Dressing;Sleep;Hygiene/Grooming    Examination-Participation Restrictions  Yard Work;Cleaning;Community Activity;Driving;Laundry    Stability/Clinical Decision Making  Stable/Uncomplicated    Rehab Potential  Good    PT Frequency  3x / week    PT Duration  6 weeks    PT Treatment/Interventions  ADLs/Self Care Home Management;Aquatic Therapy;Biofeedback;Cryotherapy;Electrical Stimulation;Contrast Bath;Therapeutic exercise;Therapeutic activities;Orthotic Fit/Training;Patient/family education;Fluidtherapy;Parrafin;Ultrasound;Traction;Moist Heat;Functional mobility training;Stair training;Neuromuscular re-education;Balance training;DME Instruction;Gait training;Iontophoresis 4mg /ml Dexamethasone;Manual techniques;Manual lymph drainage;Compression bandaging;Vasopneumatic Device;Taping;Splinting;Joint Manipulations;Spinal Manipulations;Energy conservation;Dry needling;Passive range of motion    PT Next Visit Plan  Progress cervical and thoracic mobility, as well as postural strengthening as tolerated.    PT Home Exercise Plan  04/12/20: posture correction, scap retraction, chin tuck; 4/19 neck rolls, self STM with tennis ball; 4/23:  deep breathing       Patient will benefit from skilled therapeutic intervention in order to improve the following deficits and impairments:  Increased fascial restricitons, Improper body mechanics, Pain, Decreased mobility, Increased muscle spasms, Postural dysfunction,  Decreased activity tolerance, Decreased range of motion, Decreased strength, Hypomobility  Visit Diagnosis: Cervicalgia  Pain in thoracic spine  Abnormal posture     Problem List Patient Active Problem List   Diagnosis Date Noted  . Tobacco use disorder 04/20/2019    4:17 PM, 04/26/20 04/28/20, DPT Physical Therapy with  [facility]  [phone] office   [facility] [address] [place], [place], [address] Phone: [phone]   Fax:  [phone]  Name: Camiya Vinal. Balducci MRN: [id number] Date of Birth: 20-Dec-1966

## 2020-04-28 ENCOUNTER — Ambulatory Visit (HOSPITAL_COMMUNITY): Payer: Self-pay | Admitting: Physical Therapy

## 2020-05-01 ENCOUNTER — Ambulatory Visit (HOSPITAL_COMMUNITY): Payer: Self-pay | Admitting: Physical Therapy

## 2020-05-02 ENCOUNTER — Ambulatory Visit: Payer: Self-pay | Admitting: Orthopaedic Surgery

## 2020-05-03 ENCOUNTER — Telehealth (HOSPITAL_COMMUNITY): Payer: Self-pay

## 2020-05-03 ENCOUNTER — Ambulatory Visit (HOSPITAL_COMMUNITY): Payer: Self-pay | Admitting: Physical Therapy

## 2020-05-03 NOTE — Telephone Encounter (Signed)
The patient's partner of 15 years he died and the funeral will be 04-19-2023- patient plans to return next Wed 5/12.

## 2020-05-05 ENCOUNTER — Ambulatory Visit (HOSPITAL_COMMUNITY): Payer: Self-pay

## 2020-05-08 ENCOUNTER — Ambulatory Visit (HOSPITAL_COMMUNITY): Payer: Self-pay | Admitting: Physical Therapy

## 2020-05-09 ENCOUNTER — Ambulatory Visit (INDEPENDENT_AMBULATORY_CARE_PROVIDER_SITE_OTHER): Payer: Self-pay | Admitting: Orthopaedic Surgery

## 2020-05-09 ENCOUNTER — Other Ambulatory Visit: Payer: Self-pay

## 2020-05-09 ENCOUNTER — Encounter: Payer: Self-pay | Admitting: Orthopaedic Surgery

## 2020-05-09 VITALS — BP 152/101 | HR 86 | Temp 98.1°F | Ht 66.0 in | Wt 198.1 lb

## 2020-05-09 DIAGNOSIS — F17210 Nicotine dependence, cigarettes, uncomplicated: Secondary | ICD-10-CM

## 2020-05-09 DIAGNOSIS — M542 Cervicalgia: Secondary | ICD-10-CM

## 2020-05-09 MED ORDER — TIZANIDINE HCL 4 MG PO TABS: ORAL_TABLET | ORAL | 1 refills | Status: DC

## 2020-05-09 NOTE — Patient Instructions (Signed)

## 2020-05-09 NOTE — Progress Notes (Signed)
Patient Judy Leonard, female DOB:October 06, 1966, 54 y.o. UYQ:[id number]  Chief Complaint  Patient presents with  . Neck Pain    R side/hurts on c-spine when touched    HPI  Judy Leonard is a 54 y.o. female who has chronic neck pain.  She is going to PT and taking her Mobic.  She is out of Zanaflex and I will reorder.  She has no weakness.  She is getting slowly better but still hurts.  She is doing her exercises.  I have read her PT notes.   Body mass index is 31.98 kg/m.  ROS  Review of Systems  Constitutional: Positive for activity change.  Musculoskeletal: Positive for arthralgias, myalgias and neck pain.  All other systems reviewed and are negative.   All other systems reviewed and are negative.  The following is a summary of the past history medically, past history surgically, known current medicines, social history and family history.  This information is gathered electronically by the computer from prior information and documentation.  I review this each visit and have found including this information at this point in the chart is beneficial and informative.    History reviewed. No pertinent past medical history.  Past Surgical History:  Procedure Laterality Date  . APPENDECTOMY  age 75    Family History  Problem Relation Age of Onset  . Diabetes Mother   . Kidney disease Mother   . Hypertension Mother   . Heart failure Mother     Social History Social History   Tobacco Use  . Smoking status: Light Tobacco Smoker    Packs/day: 0.25    Years: 31.00    Pack years: 7.75  . Smokeless tobacco: Never Used  . Tobacco comment: 1-2 cig months  Substance Use Topics  . Alcohol use: Yes    Comment: occasionally  . Drug use: Not Currently    No Known Allergies  Current Outpatient Medications  Medication Sig Dispense Refill  . benzonatate (TESSALON) 100 MG capsule TAKE 1 TO 2 CAPSULES BY MOUTH EVERY 8 HOURS AS NEEDED FOR COUGH 20 capsule 3  . DULERA  100-5 MCG/ACT AERO INHALE 2 PUFFS BY MOUTH TWICE DAILY. RINSE MOUTH AFTER EACH USE 39 g 1  . ibuprofen (ADVIL) 200 MG tablet Take 200 mg by mouth every 6 (six) hours as needed.    . meloxicam (MOBIC) 15 MG tablet Take 1 tablet (15 mg total) by mouth daily as needed for pain. 30 tablet 3  . PROVENTIL HFA 108 (90 Base) MCG/ACT inhaler INHALE 2 PUFFS BY MOUTH EVERY 6 HOURS AS NEEDED FOR COUGHING, WHEEZING, OR SHORTNESS OF BREATH 6.7 g 1  . tiZANidine (ZANAFLEX) 4 MG tablet One by mouth every night before bed as needed for spasm 30 tablet 1   No current facility-administered medications for this visit.     Physical Exam  Blood pressure (!) 152/101, pulse 86, temperature 98.1 F (36.7 C), height 5\' 6"  (1.676 m), weight 198 lb 2 oz (89.9 kg), last menstrual period 04/28/2015.  Constitutional: overall normal hygiene, normal nutrition, well developed, normal grooming, normal body habitus. Assistive device:none  Musculoskeletal: gait and station Limp none, muscle tone and strength are normal, no tremors or atrophy is present.  .  Neurological: coordination overall normal.  Deep tendon reflex/nerve stretch intact.  Sensation normal.  Cranial nerves II-XII intact.   Skin:   Normal overall no scars, lesions, ulcers or rashes. No psoriasis.  Psychiatric: Alert and oriented x 3.  Recent memory  intact, remote memory unclear.  Normal mood and affect. Well groomed.  Good eye contact.  Cardiovascular: overall no swelling, no varicosities, no edema bilaterally, normal temperatures of the legs and arms, no clubbing, cyanosis and good capillary refill.  Lymphatic: palpation is normal.  Neck full ROM.  NV intact.  All other systems reviewed and are negative   The patient has been educated about the nature of the problem(s) and counseled on treatment options.  The patient appeared to understand what I have discussed and is in agreement with it.  Encounter Diagnoses  Name Primary?  . Neck pain Yes  .  Nicotine dependence, cigarettes, uncomplicated     PLAN Call if any problems.  Precautions discussed.  Continue current medications.   Return to clinic 1 month   Continue PT.  I renewed the Zanaflex.  Electronically Signed Darreld Mclean, MD 5/11/202111:06 AM

## 2020-05-10 ENCOUNTER — Encounter (HOSPITAL_COMMUNITY): Payer: Self-pay | Admitting: Physical Therapy

## 2020-05-10 ENCOUNTER — Ambulatory Visit (HOSPITAL_COMMUNITY): Payer: Self-pay | Admitting: Speech Pathology

## 2020-05-10 ENCOUNTER — Ambulatory Visit (HOSPITAL_COMMUNITY): Payer: Self-pay | Attending: Orthopaedic Surgery | Admitting: Physical Therapy

## 2020-05-10 DIAGNOSIS — M542 Cervicalgia: Secondary | ICD-10-CM | POA: Insufficient documentation

## 2020-05-10 DIAGNOSIS — R293 Abnormal posture: Secondary | ICD-10-CM | POA: Insufficient documentation

## 2020-05-10 DIAGNOSIS — M546 Pain in thoracic spine: Secondary | ICD-10-CM | POA: Insufficient documentation

## 2020-05-10 NOTE — Therapy (Signed)
[place], [place], [address] Phone: [phone]   Fax:  [phone]  Physical Therapy Treatment  Patient Details  Name: Judy Leonard MRN: [id number] Date of Birth: 02-11-1966 Referring Provider (PT): Sanjuana Kava MD    Encounter Date: 05/10/2020  PT End of Session - 05/10/20 1447    Visit Number  5    Number of Visits  18    Date for PT Re-Evaluation  05/26/20    Authorization Type  Self Pay (Waiting for CAFA approval)    Authorization Time Period  04/12/20-05/26/20    Progress Note Due on Visit  10    PT Start Time  1447    PT Stop Time  1527    PT Time Calculation (min)  40 min    Activity Tolerance  Patient limited by pain    Behavior During Therapy  [facility] for tasks assessed/performed       History reviewed. No pertinent past medical history.  Past Surgical History:  Procedure Laterality Date  . APPENDECTOMY  age 80    There were no vitals filed for this visit.  Subjective Assessment - 05/10/20 1523    Subjective  Pain has eased a bit. States that she saw the MD and part of her neck is still very sore. States MD wants her continue PT. States her neck is just really painful and doesn't know if it will ever get better.    Patient Stated Goals  feel better    Currently in Pain?  Yes    Pain Score  5     Pain Location  Neck    Pain Orientation  Left;Posterior    Pain Onset  More than a month ago         [facility] PT Assessment - 05/10/20 0001      Assessment   Medical Diagnosis  Neck pain     Referring Provider (PT)  Sanjuana Kava MD                     [facility] Adult PT Treatment/Exercise - 05/10/20 0001      Neck Exercises: Theraband   Scapula Retraction  15 reps   stopped painful      Neck Exercises: Standing   Other Standing Exercises  shoulder W's at wall 2x15 5" holds    Other Standing Exercises  shoulder extension with towel- 2x15 5" holds       Neck Exercises: Seated    Neck Retraction  15 reps;5 secs   2 sets     Shoulder Exercises: Standing   Extension  AROM;Strengthening;Both;10 reps;Theraband   green, 3 sets      Shoulder Exercises: ROM/Strengthening   Other ROM/Strengthening Exercises  shoulder slides up wall with wash cloth 3x10,2" holds       Modalities   Modalities  Moist Heat      Moist Heat Therapy   Number Minutes Moist Heat  10 Minutes    Moist Heat Location  Cervical   during seated exercises     Manual Therapy   Manual Therapy  Soft tissue mobilization    Manual therapy comments  all interventions performed independently of other interventions.     Soft tissue mobilization  STM to righr thoracic paraspinals tolerated mildly well. slight increase in pain                PT Short Term Goals - 04/12/20 1209  PT SHORT TERM GOAL #1   Title  Patient will be independent with initial HEP to improve functional outcomes    Time  3    Period  Weeks    Status  New    Target Date  05/05/20      PT SHORT TERM GOAL #2   Title  Patient will report at least 50% overall improvement in subjective complaint to indicate improvement in ability to perform ADLs.    Time  3    Period  Weeks    Status  New    Target Date  05/05/20        PT Long Term Goals - 04/12/20 1210      PT LONG TERM GOAL #1   Title  Patient will report at least 75% overall improvement in subjective complaint to indicate improvement in ability to perform ADLs.    Time  6    Period  Weeks    Status  New    Target Date  05/26/20      PT LONG TERM GOAL #2   Title  Patient will improve FOTO score to <40% to indicate improvement in functional outcomes    Time  6    Period  Weeks    Status  New    Target Date  05/26/20      PT LONG TERM GOAL #3   Title  Patient will have equal to or > 4+/5 MMT throughout BUE to improve ability to perform UE ADLs/ dressing/ grooming tasks.    Time  6    Period  Weeks    Status  New    Target Date  05/26/20      PT  LONG TERM GOAL #4   Title  Patient will improve LT cervical rotation to within 5 degrees of contralateral side to improve ability to scan environment for safety and while driving.    Time  6    Period  Weeks    Status  New    Target Date  05/26/20            Plan - 05/10/20 1526    Clinical Impression Statement  Limited tolerance to therapy during today's session. Increased pain with manual therapy and most therapeutic exercised. Patient did report some relief with shoulder slides up wall and shoulder extension. Added these to HEP. Will attempt additional ROM exercises and possible modalities next session if continued pain levels persist.    Examination-Activity Limitations  Lift;Bend;Reach Overhead;Caring for Others;Carry;Sit;Dressing;Sleep;Hygiene/Grooming    Examination-Participation Restrictions  Yard Work;Cleaning;Community Activity;Driving;Laundry    Stability/Clinical Decision Making  Stable/Uncomplicated    Rehab Potential  Good    PT Frequency  3x / week    PT Duration  6 weeks    PT Treatment/Interventions  ADLs/Self Care Home Management;Aquatic Therapy;Biofeedback;Cryotherapy;Electrical Stimulation;Contrast Bath;Therapeutic exercise;Therapeutic activities;Orthotic Fit/Training;Patient/family education;Fluidtherapy;Parrafin;Ultrasound;Traction;Moist Heat;Functional mobility training;Stair training;Neuromuscular re-education;Balance training;DME Instruction;Gait training;Iontophoresis 4mg /ml Dexamethasone;Manual techniques;Manual lymph drainage;Compression bandaging;Vasopneumatic Device;Taping;Splinting;Joint Manipulations;Spinal Manipulations;Energy conservation;Dry needling;Passive range of motion    PT Next Visit Plan  Progress cervical and thoracic mobility, as well as postural strengthening as tolerated.    PT Home Exercise Plan  04/12/20: posture correction, scap retraction, chin tuck; 4/19 neck rolls, self STM with tennis ball; 4/23:  deep breathing; 5/12 shoulder ext, flexion  and ER       Patient will benefit from skilled therapeutic intervention in order to improve the following deficits and impairments:  Increased fascial restricitons, Improper body mechanics, Pain, Decreased mobility, Increased muscle spasms,  Postural dysfunction, Decreased activity tolerance, Decreased range of motion, Decreased strength, Hypomobility  Visit Diagnosis: Cervicalgia  Pain in thoracic spine  Abnormal posture     Problem List Patient Active Problem List   Diagnosis Date Noted  . Tobacco use disorder 04/20/2019   3:31 PM, 05/10/20 Tereasa Coop, DPT Physical Therapy with [facility]  [phone] office  [facility] [address] [place], [place], [address] Phone: [phone]   Fax:  [phone]  Name: Judy Leonard MRN: [id number] Date of Birth: 04/12/1966

## 2020-05-12 ENCOUNTER — Ambulatory Visit (HOSPITAL_COMMUNITY): Payer: Self-pay

## 2020-05-12 ENCOUNTER — Telehealth (HOSPITAL_COMMUNITY): Payer: Self-pay

## 2020-05-12 NOTE — Telephone Encounter (Signed)
pt called to cx today's appt due to she is not feeling well from her covid shot

## 2020-05-15 ENCOUNTER — Other Ambulatory Visit: Payer: Self-pay

## 2020-05-15 ENCOUNTER — Ambulatory Visit (HOSPITAL_COMMUNITY): Payer: Self-pay | Admitting: Physical Therapy

## 2020-05-15 DIAGNOSIS — M546 Pain in thoracic spine: Secondary | ICD-10-CM

## 2020-05-15 DIAGNOSIS — M542 Cervicalgia: Secondary | ICD-10-CM

## 2020-05-15 DIAGNOSIS — R293 Abnormal posture: Secondary | ICD-10-CM

## 2020-05-15 NOTE — Therapy (Signed)
[place], [place], [address] Phone: [phone]   Fax:  [phone]  Physical Therapy Treatment  Patient Details  Name: Judy Leonard MRN: [id number] Date of Birth: 07-29-1966 Referring Provider (PT): Sanjuana Kava MD    Encounter Date: 05/15/2020  PT End of Session - 05/15/20 1400    Visit Number  6    Number of Visits  18    Date for PT Re-Evaluation  05/26/20    Authorization Type  Self Pay (Waiting for CAFA approval)    Authorization Time Period  04/12/20-05/26/20    Progress Note Due on Visit  10    PT Start Time  1308    PT Stop Time  1346    PT Time Calculation (min)  38 min    Activity Tolerance  Patient limited by pain    Behavior During Therapy  [facility] for tasks assessed/performed       No past medical history on file.  Past Surgical History:  Procedure Laterality Date  . APPENDECTOMY  age 72    There were no vitals filed for this visit.  Subjective Assessment - 05/15/20 1313    Subjective  Pt states she was sick from her second covid shot and could not come in last visit . STates her Lt arm is still sore from it.  Currently with 3/10 pain in cervical area    Currently in Pain?  Yes    Pain Score  3     Pain Location  Neck    Pain Orientation  Posterior;Medial    Pain Descriptors / Indicators  Aching;Tightness                        [facility] Adult PT Treatment/Exercise - 05/15/20 0001      Neck Exercises: Theraband   Scapula Retraction  15 reps;Red    Shoulder Extension  15 reps;Red    Rows  15 reps;Red      Neck Exercises: Seated   Neck Retraction  10 reps    Cervical Rotation  5 reps    Lateral Flexion  5 reps    W Back  10 reps      Shoulder Exercises: Seated   Other Seated Exercises  thoracic excursions with UE movements 5 reps each      Manual Therapy   Manual Therapy  Soft tissue mobilization    Manual therapy comments  all interventions performed  independently of other interventions.     Soft tissue mobilization  STM to bil cervical and  thoracic paraspinals in prone and seated               PT Short Term Goals - 04/12/20 1209      PT SHORT TERM GOAL #1   Title  Patient will be independent with initial HEP to improve functional outcomes    Time  3    Period  Weeks    Status  New    Target Date  05/05/20      PT SHORT TERM GOAL #2   Title  Patient will report at least 50% overall improvement in subjective complaint to indicate improvement in ability to perform ADLs.    Time  3    Period  Weeks    Status  New    Target Date  05/05/20        PT Long Term Goals - 04/12/20 1210  PT LONG TERM GOAL #1   Title  Patient will report at least 75% overall improvement in subjective complaint to indicate improvement in ability to perform ADLs.    Time  6    Period  Weeks    Status  New    Target Date  05/26/20      PT LONG TERM GOAL #2   Title  Patient will improve FOTO score to <40% to indicate improvement in functional outcomes    Time  6    Period  Weeks    Status  New    Target Date  05/26/20      PT LONG TERM GOAL #3   Title  Patient will have equal to or > 4+/5 MMT throughout BUE to improve ability to perform UE ADLs/ dressing/ grooming tasks.    Time  6    Period  Weeks    Status  New    Target Date  05/26/20      PT LONG TERM GOAL #4   Title  Patient will improve LT cervical rotation to within 5 degrees of contralateral side to improve ability to scan environment for safety and while driving.    Time  6    Period  Weeks    Status  New    Target Date  05/26/20            Plan - 05/15/20 1400    Clinical Impression Statement  Pt with slightly reduced pain this session in neck and upper thoracic.  completed established therex and added thoracic excursions with UE movements as well without any c/o pain or noted reduction in ROM.  Manual completed both in prone and seated positions for bilateral  cervical and thoracic parapinals.  Pt hypersensitive at first but able to relax with improvement in symptoms voiced at end of session.  Only minimal tighness and spasm in lt UT only this session. Pt is using her heat at home.    Examination-Activity Limitations  Lift;Bend;Reach Overhead;Caring for Others;Carry;Sit;Dressing;Sleep;Hygiene/Grooming    Examination-Participation Restrictions  Yard Work;Cleaning;Community Activity;Driving;Laundry    Stability/Clinical Decision Making  Stable/Uncomplicated    Rehab Potential  Good    PT Frequency  3x / week    PT Duration  6 weeks    PT Treatment/Interventions  ADLs/Self Care Home Management;Aquatic Therapy;Biofeedback;Cryotherapy;Electrical Stimulation;Contrast Bath;Therapeutic exercise;Therapeutic activities;Orthotic Fit/Training;Patient/family education;Fluidtherapy;Parrafin;Ultrasound;Traction;Moist Heat;Functional mobility training;Stair training;Neuromuscular re-education;Balance training;DME Instruction;Gait training;Iontophoresis 4mg /ml Dexamethasone;Manual techniques;Manual lymph drainage;Compression bandaging;Vasopneumatic Device;Taping;Splinting;Joint Manipulations;Spinal Manipulations;Energy conservation;Dry needling;Passive range of motion    PT Next Visit Plan  Progress cervical and thoracic mobility, as well as postural strengthening as tolerated.  Continue with manual as needed for pain and dysfuntion.    PT Home Exercise Plan  04/12/20: posture correction, scap retraction, chin tuck; 4/19 neck rolls, self STM with tennis ball; 4/23:  deep breathing; 5/12 shoulder ext, flexion and ER       Patient will benefit from skilled therapeutic intervention in order to improve the following deficits and impairments:  Increased fascial restricitons, Improper body mechanics, Pain, Decreased mobility, Increased muscle spasms, Postural dysfunction, Decreased activity tolerance, Decreased range of motion, Decreased strength, Hypomobility  Visit  Diagnosis: Cervicalgia  Pain in thoracic spine  Abnormal posture     Problem List Patient Active Problem List   Diagnosis Date Noted  . Tobacco use disorder 04/20/2019   04/22/2019, PTA/CLT [phone]  [phone] 05/15/2020, 2:04 PM  [place] [address] [place], [place], [place]  Phone: [phone]   Fax:  [phone]  Name: Judy Leonard MRN: [id number] Date of Birth: Nov 10, 1966

## 2020-05-17 ENCOUNTER — Other Ambulatory Visit: Payer: Self-pay

## 2020-05-17 ENCOUNTER — Ambulatory Visit (HOSPITAL_COMMUNITY): Payer: Self-pay | Admitting: Physical Therapy

## 2020-05-17 ENCOUNTER — Encounter (HOSPITAL_COMMUNITY): Payer: Self-pay | Admitting: Physical Therapy

## 2020-05-17 DIAGNOSIS — M546 Pain in thoracic spine: Secondary | ICD-10-CM

## 2020-05-17 DIAGNOSIS — M542 Cervicalgia: Secondary | ICD-10-CM

## 2020-05-17 DIAGNOSIS — R293 Abnormal posture: Secondary | ICD-10-CM

## 2020-05-17 NOTE — Patient Instructions (Signed)
seated with feet flat on the floor -try to sit upright in good ACTIVE posture - aka grow tall from trunk while relaxing shoulders. breath throughout - only sit as tall as tolerated - if cramp comes on stop and rest - hold for 5-10 seconds at a time.

## 2020-05-17 NOTE — Therapy (Signed)
[facility] [address] [place], [place], [address] Phone: [phone]   Fax:  [phone]  Physical Therapy Treatment  Patient Details  Name: Judy Leonard MRN: [id number] Date of Birth: 02-Jun-1966 Referring Provider (PT): Darreld Mclean MD    Encounter Date: 05/17/2020  PT End of Session - 05/17/20 1316    Visit Number  7    Number of Visits  18    Date for PT Re-Evaluation  05/26/20    Authorization Type  Self Pay (Waiting for CAFA approval)    Authorization Time Period  04/12/20-05/26/20    Progress Note Due on Visit  10    PT Start Time  1315    PT Stop Time  1355    PT Time Calculation (min)  40 min    Activity Tolerance  Patient limited by pain    Behavior During Therapy  [facility] for tasks assessed/performed       History reviewed. No pertinent past medical history.  Past Surgical History:  Procedure Laterality Date  . APPENDECTOMY  age 38    There were no vitals filed for this visit.  Subjective Assessment - 05/17/20 1321    Subjective  States she is feeling better reports 2.5/10 in her neck and after the massage last time she felt pretty good.    Currently in Pain?  Yes    Pain Score  3     Pain Location  Neck    Pain Orientation  Medial;Mid;Posterior    Pain Descriptors / Indicators  Aching         [facility] PT Assessment - 05/17/20 0001      Assessment   Medical Diagnosis  Neck pain     Referring Provider (PT)  Darreld Mclean MD                     [facility] Adult PT Treatment/Exercise - 05/17/20 0001      Neck Exercises: Standing   Other Standing Exercises  overhead pull downs RTB 2x10 B       Shoulder Exercises: Seated   Other Seated Exercises  posture exercises - holds 5 minutes       Shoulder Exercises: Standing   Other Standing Exercises  face pulls 3x15 RTB       Manual Therapy   Manual Therapy  Soft tissue mobilization    Manual therapy comments  all interventions performed  independently of other interventions.     Soft tissue mobilization  STM to B UT adn cervcal paraspinals              PT Education - 05/17/20 1358    Education Details  on HEP, on benefits of supportive bra and types, on muscle stretching/joint activation, cramps and spasms    Person(s) Educated  Patient    Methods  Explanation    Comprehension  Verbalized understanding       PT Short Term Goals - 04/12/20 1209      PT SHORT TERM GOAL #1   Title  Patient will be independent with initial HEP to improve functional outcomes    Time  3    Period  Weeks    Status  New    Target Date  05/05/20      PT SHORT TERM GOAL #2   Title  Patient will report at least 50% overall improvement in subjective complaint to indicate improvement in ability to perform ADLs.    Time  3    Period  Weeks    Status  New    Target Date  05/05/20        PT Long Term Goals - 04/12/20 1210      PT LONG TERM GOAL #1   Title  Patient will report at least 75% overall improvement in subjective complaint to indicate improvement in ability to perform ADLs.    Time  6    Period  Weeks    Status  New    Target Date  05/26/20      PT LONG TERM GOAL #2   Title  Patient will improve FOTO score to <40% to indicate improvement in functional outcomes    Time  6    Period  Weeks    Status  New    Target Date  05/26/20      PT LONG TERM GOAL #3   Title  Patient will have equal to or > 4+/5 MMT throughout BUE to improve ability to perform UE ADLs/ dressing/ grooming tasks.    Time  6    Period  Weeks    Status  New    Target Date  05/26/20      PT LONG TERM GOAL #4   Title  Patient will improve LT cervical rotation to within 5 degrees of contralateral side to improve ability to scan environment for safety and while driving.    Time  6    Period  Weeks    Status  New    Target Date  05/26/20            Plan - 05/17/20 1316    Clinical Impression Statement  Focused on educating patient on  benefits of supportive bra as well as origin of muscle cramps/spasms. Discussed active versus passive posture and added seated posture exercise to HEP. Answered all questions during session. Less pain but muscle soreness and fatigue noted after today's session.    Examination-Activity Limitations  Lift;Bend;Reach Overhead;Caring for Others;Carry;Sit;Dressing;Sleep;Hygiene/Grooming    Examination-Participation Restrictions  Yard Work;Cleaning;Community Activity;Driving;Laundry    Stability/Clinical Decision Making  Stable/Uncomplicated    Rehab Potential  Good    PT Frequency  3x / week    PT Duration  6 weeks    PT Treatment/Interventions  ADLs/Self Care Home Management;Aquatic Therapy;Biofeedback;Cryotherapy;Electrical Stimulation;Contrast Bath;Therapeutic exercise;Therapeutic activities;Orthotic Fit/Training;Patient/family education;Fluidtherapy;Parrafin;Ultrasound;Traction;Moist Heat;Functional mobility training;Stair training;Neuromuscular re-education;Balance training;DME Instruction;Gait training;Iontophoresis 4mg /ml Dexamethasone;Manual techniques;Manual lymph drainage;Compression bandaging;Vasopneumatic Device;Taping;Splinting;Joint Manipulations;Spinal Manipulations;Energy conservation;Dry needling;Passive range of motion    PT Next Visit Plan  Progress cervical and thoracic mobility, as well as postural strengthening as tolerated.  Continue with manual as needed for pain and dysfuntion.    PT Home Exercise Plan  04/12/20: posture correction, scap retraction, chin tuck; 4/19 neck rolls, self STM with tennis ball; 4/23:  deep breathing; 5/12 shoulder ext, flexion and ER; 5/19 posture hold       Patient will benefit from skilled therapeutic intervention in order to improve the following deficits and impairments:  Increased fascial restricitons, Improper body mechanics, Pain, Decreased mobility, Increased muscle spasms, Postural dysfunction, Decreased activity tolerance, Decreased range of motion,  Decreased strength, Hypomobility  Visit Diagnosis: Cervicalgia  Pain in thoracic spine  Abnormal posture     Problem List Patient Active Problem List   Diagnosis Date Noted  . Tobacco use disorder 04/20/2019   2:01 PM, 05/17/20 05/19/20, DPT Physical Therapy with [facility]  [phone] office  [place] [facility] [address]  [place], [place], [address] Phone: [phone]   Fax:  [phone]  Name: Moranda A. Innes MRN: [id number] Date of Birth: 07/16/1966

## 2020-05-19 ENCOUNTER — Ambulatory Visit (HOSPITAL_COMMUNITY): Payer: Self-pay | Admitting: Physical Therapy

## 2020-05-19 ENCOUNTER — Telehealth (HOSPITAL_COMMUNITY): Payer: Self-pay | Admitting: Physical Therapy

## 2020-05-19 NOTE — Telephone Encounter (Signed)
No Show #1  Called and left message for patient about missed appointment and reminded patient of follow up appointment.   3:04 PM, 05/19/20 Tereasa Coop, DPT Physical Therapy with [facility]  [phone] office

## 2020-05-22 ENCOUNTER — Ambulatory Visit (HOSPITAL_COMMUNITY): Payer: Self-pay | Admitting: Physical Therapy

## 2020-05-22 ENCOUNTER — Telehealth (HOSPITAL_COMMUNITY): Payer: Self-pay | Admitting: Physical Therapy

## 2020-05-22 NOTE — Telephone Encounter (Signed)
pt called to cx both of her appts on mon and wed due to she has something going on.

## 2020-05-24 ENCOUNTER — Encounter (HOSPITAL_COMMUNITY): Payer: Self-pay | Admitting: Physical Therapy

## 2020-05-26 ENCOUNTER — Telehealth (HOSPITAL_COMMUNITY): Payer: Self-pay | Admitting: Physical Therapy

## 2020-05-26 ENCOUNTER — Ambulatory Visit (HOSPITAL_COMMUNITY): Payer: Self-pay | Admitting: Physical Therapy

## 2020-05-26 NOTE — Telephone Encounter (Signed)
No show. Called and left message about missed appointment. No additional appointments scheduled at this time. Instructed patient to call clinic to schedule follow up appointment if she would like to continue physical therapy.   12:27 PM, 05/26/20 Tereasa Coop, DPT Physical Therapy with [facility]  [phone] office

## 2020-06-08 ENCOUNTER — Encounter: Payer: Self-pay | Admitting: Orthopaedic Surgery

## 2020-06-08 ENCOUNTER — Ambulatory Visit: Payer: Self-pay | Admitting: Orthopaedic Surgery

## 2020-06-08 ENCOUNTER — Other Ambulatory Visit: Payer: Self-pay

## 2020-06-08 VITALS — BP 127/94 | HR 106 | Ht 65.0 in | Wt 195.1 lb

## 2020-06-08 DIAGNOSIS — F17210 Nicotine dependence, cigarettes, uncomplicated: Secondary | ICD-10-CM

## 2020-06-08 DIAGNOSIS — M542 Cervicalgia: Secondary | ICD-10-CM

## 2020-06-08 NOTE — Progress Notes (Signed)
Patient Judy Leonard, female DOB:Dec 01, 1966, 54 y.o. NGE:[id number]  Chief Complaint  Patient presents with  . Neck Pain    Sore and hurting  . Back Pain    hurting    HPI  Judy Leonard is a 54 y.o. female who has neck pain.  She has been going to PT.  I have reviewed her notes.  She is slowly getting better.  She is taking the Zanaflex and it helps.  She has no new trauma.  I have suggested water exercises in a pool if she has availability to do so.   Body mass index is 32.47 kg/m.  ROS  Review of Systems  Constitutional: Positive for activity change.  Musculoskeletal: Positive for arthralgias, myalgias and neck pain.  All other systems reviewed and are negative.   All other systems reviewed and are negative.  The following is a summary of the past history medically, past history surgically, known current medicines, social history and family history.  This information is gathered electronically by the computer from prior information and documentation.  I review this each visit and have found including this information at this point in the chart is beneficial and informative.    History reviewed. No pertinent past medical history.  Past Surgical History:  Procedure Laterality Date  . APPENDECTOMY  age 1    Family History  Problem Relation Age of Onset  . Diabetes Mother   . Kidney disease Mother   . Hypertension Mother   . Heart failure Mother     Social History Social History   Tobacco Use  . Smoking status: Light Tobacco Smoker    Packs/day: 0.25    Years: 31.00    Pack years: 7.75  . Smokeless tobacco: Never Used  . Tobacco comment: 1-2 cig months  Vaping Use  . Vaping Use: Never used  Substance Use Topics  . Alcohol use: Yes    Comment: occasionally  . Drug use: Not Currently    No Known Allergies  Current Outpatient Medications  Medication Sig Dispense Refill  . benzonatate (TESSALON) 100 MG capsule TAKE 1 TO 2 CAPSULES BY MOUTH  EVERY 8 HOURS AS NEEDED FOR COUGH 20 capsule 3  . DULERA 100-5 MCG/ACT AERO INHALE 2 PUFFS BY MOUTH TWICE DAILY. RINSE MOUTH AFTER EACH USE 39 g 1  . ibuprofen (ADVIL) 200 MG tablet Take 200 mg by mouth every 6 (six) hours as needed.    . meloxicam (MOBIC) 15 MG tablet Take 1 tablet (15 mg total) by mouth daily as needed for pain. 30 tablet 3  . PROVENTIL HFA 108 (90 Base) MCG/ACT inhaler INHALE 2 PUFFS BY MOUTH EVERY 6 HOURS AS NEEDED FOR COUGHING, WHEEZING, OR SHORTNESS OF BREATH 6.7 g 1  . tiZANidine (ZANAFLEX) 4 MG tablet One by mouth every night before bed as needed for spasm 30 tablet 1   No current facility-administered medications for this visit.     Physical Exam  Blood pressure (!) 127/94, pulse (!) 106, height 5\' 5"  (1.651 m), weight 195 lb 2 oz (88.5 kg), last menstrual period 04/28/2015.  Constitutional: overall normal hygiene, normal nutrition, well developed, normal grooming, normal body habitus. Assistive device:none  Musculoskeletal: gait and station Limp none, muscle tone and strength are normal, no tremors or atrophy is present.  .  Neurological: coordination overall normal.  Deep tendon reflex/nerve stretch intact.  Sensation normal.  Cranial nerves II-XII intact.   Skin:   Normal overall no scars, lesions, ulcers or rashes.  No psoriasis.  Psychiatric: Alert and oriented x 3.  Recent memory intact, remote memory unclear.  Normal mood and affect. Well groomed.  Good eye contact.  Cardiovascular: overall no swelling, no varicosities, no edema bilaterally, normal temperatures of the legs and arms, no clubbing, cyanosis and good capillary refill.  Lymphatic: palpation is normal.  Neck has full ROM today, no spasm, NV intact. Grips normal.  All other systems reviewed and are negative   The patient has been educated about the nature of the problem(s) and counseled on treatment options.  The patient appeared to understand what I have discussed and is in agreement with  it.  Encounter Diagnoses  Name Primary?  . Neck pain Yes  . Nicotine dependence, cigarettes, uncomplicated     PLAN Call if any problems.  Precautions discussed.  Continue current medications.   Return to clinic 1 month   Electronically Signed Darreld Mclean, MD 6/10/20218:25 AM

## 2020-06-08 NOTE — Patient Instructions (Signed)

## 2020-06-12 ENCOUNTER — Ambulatory Visit (HOSPITAL_COMMUNITY)
Admission: RE | Admit: 2020-06-12 | Discharge: 2020-06-12 | Disposition: A | Discharge status: Home / Self Care | Payer: Self-pay | Source: Ambulatory Visit | Attending: Physician Assistant | Admitting: Physician Assistant

## 2020-06-12 ENCOUNTER — Other Ambulatory Visit: Payer: Self-pay

## 2020-06-12 DIAGNOSIS — Z1239 Encounter for other screening for malignant neoplasm of breast: Secondary | ICD-10-CM | POA: Insufficient documentation

## 2020-06-28 ENCOUNTER — Other Ambulatory Visit: Payer: Self-pay

## 2020-06-28 ENCOUNTER — Ambulatory Visit (HOSPITAL_COMMUNITY)
Admission: RE | Admit: 2020-06-28 | Discharge: 2020-06-28 | Disposition: A | Discharge status: Home / Self Care | Payer: Self-pay | Source: Ambulatory Visit | Attending: Physician Assistant | Admitting: Physician Assistant

## 2020-06-28 DIAGNOSIS — Z1239 Encounter for other screening for malignant neoplasm of breast: Secondary | ICD-10-CM | POA: Insufficient documentation

## 2020-07-05 ENCOUNTER — Other Ambulatory Visit: Payer: Self-pay | Admitting: Orthopaedic Surgery

## 2020-07-06 ENCOUNTER — Encounter: Payer: Self-pay | Admitting: Orthopaedic Surgery

## 2020-07-06 ENCOUNTER — Other Ambulatory Visit: Payer: Self-pay

## 2020-07-06 ENCOUNTER — Ambulatory Visit (INDEPENDENT_AMBULATORY_CARE_PROVIDER_SITE_OTHER): Payer: Self-pay | Admitting: Orthopaedic Surgery

## 2020-07-06 VITALS — BP 144/97 | HR 106 | Ht 66.0 in | Wt 194.2 lb

## 2020-07-06 DIAGNOSIS — M542 Cervicalgia: Secondary | ICD-10-CM

## 2020-07-06 DIAGNOSIS — F17210 Nicotine dependence, cigarettes, uncomplicated: Secondary | ICD-10-CM

## 2020-07-06 MED ORDER — TIZANIDINE HCL 4 MG PO TABS: ORAL_TABLET | ORAL | 1 refills | Status: DC

## 2020-07-06 NOTE — Progress Notes (Signed)
Patient VV:OHYWVPXT Judy Leonard, female DOB:1966-03-07, 54 y.o. GGY:[id number]  Chief Complaint  Patient presents with  . Neck Pain    4 wk follow up    HPI  Judy Leonard is a 54 y.o. female who has continued pain of the neck.  She is taking her medicine.  She is not getting any better.  She has no new trauma.  I will get MRI of the cervical spine.   Body mass index is 31.34 kg/m.  ROS  Review of Systems  Constitutional: Positive for activity change.  Musculoskeletal: Positive for arthralgias, myalgias and neck pain.  All other systems reviewed and are negative.   All other systems reviewed and are negative.  The following is a summary of the past history medically, past history surgically, known current medicines, social history and family history.  This information is gathered electronically by the computer from prior information and documentation.  I review this each visit and have found including this information at this point in the chart is beneficial and informative.    History reviewed. No pertinent past medical history.  Past Surgical History:  Procedure Laterality Date  . APPENDECTOMY  age 59    Family History  Problem Relation Age of Onset  . Diabetes Mother   . Kidney disease Mother   . Hypertension Mother   . Heart failure Mother     Social History Social History   Tobacco Use  . Smoking status: Light Tobacco Smoker    Packs/day: 0.25    Years: 31.00    Pack years: 7.75  . Smokeless tobacco: Never Used  . Tobacco comment: 1-2 cig months  Vaping Use  . Vaping Use: Never used  Substance Use Topics  . Alcohol use: Yes    Comment: occasionally  . Drug use: Not Currently    No Known Allergies  Current Outpatient Medications  Medication Sig Dispense Refill  . benzonatate (TESSALON) 100 MG capsule TAKE 1 TO 2 CAPSULES BY MOUTH EVERY 8 HOURS AS NEEDED FOR COUGH 20 capsule 3  . DULERA 100-5 MCG/ACT AERO INHALE 2 PUFFS BY MOUTH TWICE DAILY.  RINSE MOUTH AFTER EACH USE 39 g 1  . ibuprofen (ADVIL) 200 MG tablet Take 200 mg by mouth every 6 (six) hours as needed.    . meloxicam (MOBIC) 15 MG tablet Take 1 tablet (15 mg total) by mouth daily as needed for pain. 30 tablet 3  . PROVENTIL HFA 108 (90 Base) MCG/ACT inhaler INHALE 2 PUFFS BY MOUTH EVERY 6 HOURS AS NEEDED FOR COUGHING, WHEEZING, OR SHORTNESS OF BREATH 6.7 g 1  . tiZANidine (ZANAFLEX) 4 MG tablet One by mouth every night before bed as needed for spasm 30 tablet 1   No current facility-administered medications for this visit.     Physical Exam  Blood pressure (!) 144/97, pulse (!) 106, height 5\' 6"  (1.676 m), weight 194 lb 3.2 oz (88.1 kg), last menstrual period 04/28/2015.  Constitutional: overall normal hygiene, normal nutrition, well developed, normal grooming, normal body habitus. Assistive device:none  Musculoskeletal: gait and station Limp none, muscle tone and strength are normal, no tremors or atrophy is present.  .  Neurological: coordination overall normal.  Deep tendon reflex/nerve stretch intact.  Sensation normal.  Cranial nerves II-XII intact.   Skin:   Normal overall no scars, lesions, ulcers or rashes. No psoriasis.  Psychiatric: Alert and oriented x 3.  Recent memory intact, remote memory unclear.  Normal mood and affect. Well groomed.  Good eye  contact.  Cardiovascular: overall no swelling, no varicosities, no edema bilaterally, normal temperatures of the legs and arms, no clubbing, cyanosis and good capillary refill.  Lymphatic: palpation is normal.  Neck nuchal muscles tight but no spasm.  NV intact.  All other systems reviewed and are negative   The patient has been educated about the nature of the problem(s) and counseled on treatment options.  The patient appeared to understand what I have discussed and is in agreement with it.  Encounter Diagnoses  Name Primary?  . Neck pain Yes  . Nicotine dependence, cigarettes, uncomplicated   .  Cervicalgia     PLAN Call if any problems.  Precautions discussed.  Continue current medications.   Return to clinic 2 weeks   Get MRI of the cervical spine.  Electronically Signed Darreld Mclean, MD 7/8/20219:29 AM

## 2020-07-25 ENCOUNTER — Ambulatory Visit: Payer: Self-pay | Admitting: Orthopaedic Surgery

## 2020-07-26 ENCOUNTER — Other Ambulatory Visit: Payer: Self-pay | Admitting: Physician Assistant

## 2020-07-26 MED ORDER — FLUTICASONE-SALMETEROL 100-50 MCG/DOSE IN AEPB: 1.0000 | INHALATION_SPRAY | Freq: Two times a day (BID) | RESPIRATORY_TRACT | 1 refills | Status: DC

## 2020-08-01 ENCOUNTER — Ambulatory Visit (HOSPITAL_COMMUNITY)
Admission: RE | Admit: 2020-08-01 | Discharge: 2020-08-01 | Disposition: A | Discharge status: Home / Self Care | Payer: Self-pay | Source: Ambulatory Visit | Attending: Orthopaedic Surgery | Admitting: Orthopaedic Surgery

## 2020-08-01 ENCOUNTER — Other Ambulatory Visit: Payer: Self-pay | Admitting: Orthopaedic Surgery

## 2020-08-01 ENCOUNTER — Other Ambulatory Visit: Payer: Self-pay

## 2020-08-01 DIAGNOSIS — M542 Cervicalgia: Secondary | ICD-10-CM

## 2020-08-03 ENCOUNTER — Ambulatory Visit: Payer: Self-pay | Admitting: Orthopaedic Surgery

## 2020-08-08 ENCOUNTER — Encounter: Payer: Self-pay | Admitting: Orthopaedic Surgery

## 2020-08-08 ENCOUNTER — Other Ambulatory Visit: Payer: Self-pay

## 2020-08-08 ENCOUNTER — Ambulatory Visit (INDEPENDENT_AMBULATORY_CARE_PROVIDER_SITE_OTHER): Payer: Self-pay | Admitting: Orthopaedic Surgery

## 2020-08-08 VITALS — BP 144/97 | HR 86 | Ht 66.0 in | Wt 197.0 lb

## 2020-08-08 DIAGNOSIS — M542 Cervicalgia: Secondary | ICD-10-CM

## 2020-08-08 DIAGNOSIS — F17210 Nicotine dependence, cigarettes, uncomplicated: Secondary | ICD-10-CM

## 2020-08-08 MED ORDER — TIZANIDINE HCL 4 MG PO TABS: ORAL_TABLET | ORAL | 1 refills | Status: DC

## 2020-08-08 NOTE — Progress Notes (Signed)
Patient Judy Leonard, female DOB:[birth year], 54 y.o. PPI:[id number]  Chief Complaint  Patient presents with  . Neck Pain    HPI  Judy Leonard is a 54 y.o. female who has neck pain. She had MRI which showed: IMPRESSION: 1. Multilevel degenerative changes of the cervical spine as described above, with mild-to-moderate spinal canal stenosis at C4-5 and mild spinal canal stenosis at C5-6. 2. Moderate bilateral neural foraminal narrowing at C4-5. 3. 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  I have explained the findings to her.  She will continue her medicine.   Body mass index is 31.8 kg/m.  ROS  Review of Systems  Constitutional: Positive for activity change.  Musculoskeletal: Positive for arthralgias, myalgias and neck pain.  All other systems reviewed and are negative.   All other systems reviewed and are negative.  The following is a summary of the past history medically, past history surgically, known current medicines, social history and family history.  This information is gathered electronically by the computer from prior information and documentation.  I review this each visit and have found including this information at this point in the chart is beneficial and informative.    History reviewed. No pertinent past medical history.  Past Surgical History:  Procedure Laterality Date  . APPENDECTOMY  age 17    Family History  Problem Relation Age of Onset  . Diabetes Mother   . Kidney disease Mother   . Hypertension Mother   . Heart failure Mother     Social History Social History   Tobacco Use  . Smoking status: Light Tobacco Smoker    Packs/day: 0.25    Years: 31.00    Pack years: 7.75  . Smokeless tobacco: Never Used  . Tobacco comment: 1-2 cig months  Vaping Use  . Vaping Use: Never used  Substance Use Topics  . Alcohol use: Yes    Comment:  occasionally  . Drug use: Not Currently    No Known Allergies  Current Outpatient Medications  Medication Sig Dispense Refill  . benzonatate (TESSALON) 100 MG capsule TAKE 1 TO 2 CAPSULES BY MOUTH EVERY 8 HOURS AS NEEDED FOR COUGH 20 capsule 3  . DULERA 100-5 MCG/ACT AERO INHALE 2 PUFFS BY MOUTH TWICE DAILY. RINSE MOUTH AFTER EACH USE 39 g 1  . ibuprofen (ADVIL) 200 MG tablet Take 200 mg by mouth every 6 (six) hours as needed.    . meloxicam (MOBIC) 15 MG tablet Take 1 tablet (15 mg total) by mouth daily as needed for pain. 30 tablet 3  . PROVENTIL HFA 108 (90 Base) MCG/ACT inhaler INHALE 2 PUFFS BY MOUTH EVERY 6 HOURS AS NEEDED FOR COUGHING, WHEEZING, OR SHORTNESS OF BREATH 6.7 g 1  . tiZANidine (ZANAFLEX) 4 MG tablet One by mouth every night before bed as needed for spasm 30 tablet 1  . Fluticasone-Salmeterol (ADVAIR DISKUS) 100-50 MCG/DOSE AEPB Inhale 1 puff into the lungs in the morning and at bedtime. (Patient not taking: Reported on 08/08/2020) 60 each 1   No current facility-administered medications for this visit.     Physical Exam  Blood pressure (!) 144/97, pulse 86, height 5\' 6"  (1.676 m), weight 197 lb (89.4 kg), last menstrual period 04/28/2015.  Constitutional: overall normal hygiene, normal nutrition, well developed, normal grooming, normal body habitus. Assistive device:none  Musculoskeletal: gait and station Limp none, muscle tone and strength are normal, no tremors or  atrophy is present.  .  Neurological: coordination overall normal.  Deep tendon reflex/nerve stretch intact.  Sensation normal.  Cranial nerves II-XII intact.   Skin:   Normal overall no scars, lesions, ulcers or rashes. No psoriasis.  Psychiatric: Alert and oriented x 3.  Recent memory intact, remote memory unclear.  Normal mood and affect. Well groomed.  Good eye contact.  Cardiovascular: overall no swelling, no varicosities, no edema bilaterally, normal temperatures of the legs and arms, no  clubbing, cyanosis and good capillary refill.  Lymphatic: palpation is normal.  Neck has full ROM.  All other systems reviewed and are negative   The patient has been educated about the nature of the problem(s) and counseled on treatment options.  The patient appeared to understand what I have discussed and is in agreement with it.  Encounter Diagnoses  Name Primary?  . Neck pain Yes  . Nicotine dependence, cigarettes, uncomplicated     PLAN Call if any problems.  Precautions discussed.  Continue current medications.   Return to clinic 6 weeks   I have refilled her Zanaflex.  Electronically Signed Darreld Mclean, MD 8/10/202110:37 AM

## 2020-08-30 ENCOUNTER — Ambulatory Visit: Payer: Self-pay | Admitting: Physician Assistant

## 2020-08-30 ENCOUNTER — Other Ambulatory Visit: Payer: Self-pay

## 2020-08-30 ENCOUNTER — Encounter: Payer: Self-pay | Admitting: Physician Assistant

## 2020-08-30 VITALS — BP 147/95 | HR 85 | Temp 97.7°F

## 2020-08-30 DIAGNOSIS — Z131 Encounter for screening for diabetes mellitus: Secondary | ICD-10-CM

## 2020-08-30 DIAGNOSIS — R7309 Other abnormal glucose: Secondary | ICD-10-CM

## 2020-08-30 DIAGNOSIS — R7989 Other specified abnormal findings of blood chemistry: Secondary | ICD-10-CM

## 2020-08-30 DIAGNOSIS — I10 Essential (primary) hypertension: Secondary | ICD-10-CM

## 2020-08-30 DIAGNOSIS — J449 Chronic obstructive pulmonary disease, unspecified: Secondary | ICD-10-CM

## 2020-08-30 DIAGNOSIS — E785 Hyperlipidemia, unspecified: Secondary | ICD-10-CM

## 2020-08-30 MED ORDER — LOSARTAN POTASSIUM 50 MG PO TABS: 50.0000 mg | ORAL_TABLET | Freq: Every day | ORAL | 0 refills | Status: DC

## 2020-08-30 NOTE — Progress Notes (Signed)
BP (!) 147/95   Pulse 85   Temp 97.7 F (36.5 C)   LMP 04/28/2015   SpO2 95%    Subjective:    Patient ID: Judy Leonard, female    DOB: 1966-05-25, 54 y.o.   MRN: [id number]  HPI: Judy Leonard is a 54 y.o. female presenting on 08/30/2020 for No chief complaint on file.   HPI   Pt had a negative covid 19 screening questionnaire.   Pt is 53yoF with COPD who presents for routine follow up  She is Not smoking every day, just every now and then.  She says she doesn't have much taste for it now because her breathing is bad.  She got covid vacination  She is still finishing up the dulera and will start the advair when it runs out.  She uses the proventil every day.    She says she has been checking her bp at home and it has been up.   She says it is always in the 140s.    She is being treated by orthopedics for her neck pain.       Relevant past medical, surgical, family and social history reviewed and updated as indicated. Interim medical history since our last visit reviewed. Allergies and medications reviewed and updated.   Current Outpatient Medications:  .  DULERA 100-5 MCG/ACT AERO, INHALE 2 PUFFS BY MOUTH TWICE DAILY. RINSE MOUTH AFTER EACH USE, Disp: 39 g, Rfl: 1 .  ibuprofen (ADVIL) 200 MG tablet, Take 200 mg by mouth every 6 (six) hours as needed., Disp: , Rfl:  .  PROVENTIL HFA 108 (90 Base) MCG/ACT inhaler, INHALE 2 PUFFS BY MOUTH EVERY 6 HOURS AS NEEDED FOR COUGHING, WHEEZING, OR SHORTNESS OF BREATH, Disp: 6.7 g, Rfl: 1 .  tiZANidine (ZANAFLEX) 4 MG tablet, One by mouth every night before bed as needed for spasm, Disp: 30 tablet, Rfl: 1 .  benzonatate (TESSALON) 100 MG capsule, TAKE 1 TO 2 CAPSULES BY MOUTH EVERY 8 HOURS AS NEEDED FOR COUGH (Patient not taking: Reported on 08/30/2020), Disp: 20 capsule, Rfl: 3 .  Fluticasone-Salmeterol (ADVAIR DISKUS) 100-50 MCG/DOSE AEPB, Inhale 1 puff into the lungs in the morning and at bedtime. (Patient not taking:  Reported on 08/08/2020), Disp: 60 each, Rfl: 1 .  meloxicam (MOBIC) 15 MG tablet, Take 1 tablet (15 mg total) by mouth daily as needed for pain. (Patient not taking: Reported on 08/30/2020), Disp: 30 tablet, Rfl: 3    Review of Systems  Per HPI unless specifically indicated above     Objective:    BP (!) 147/95   Pulse 85   Temp 97.7 F (36.5 C)   LMP 04/28/2015   SpO2 95%   Wt Readings from Last 3 Encounters:  08/08/20 197 lb (89.4 kg)  07/06/20 194 lb 3.2 oz (88.1 kg)  06/08/20 195 lb 2 oz (88.5 kg)    Physical Exam Vitals reviewed.  Constitutional:      General: She is not in acute distress.    Appearance: She is well-developed. She is not ill-appearing.  HENT:     Head: Normocephalic and atraumatic.  Cardiovascular:     Rate and Rhythm: Normal rate and regular rhythm.  Pulmonary:     Effort: Pulmonary effort is normal. No respiratory distress.     Breath sounds: Normal breath sounds.  Abdominal:     General: Bowel sounds are normal.     Palpations: Abdomen is soft. There is no mass.  Tenderness: There is no abdominal tenderness.  Musculoskeletal:     Cervical back: Neck supple.     Right lower leg: No edema.     Left lower leg: No edema.  Lymphadenopathy:     Cervical: No cervical adenopathy.  Skin:    General: Skin is warm and dry.  Neurological:     Mental Status: She is alert and oriented to person, place, and time.  Psychiatric:        Attention and Perception: Attention normal.        Speech: Speech normal.        Behavior: Behavior normal. Behavior is cooperative.            Assessment & Plan:    Encounter Diagnoses  Name Primary?  . Chronic obstructive pulmonary disease, unspecified COPD type (HCC) Yes  . Essential hypertension   . Elevated LFTs   . Hyperlipidemia, unspecified hyperlipidemia type       -Start losartan for htn -instructed pt to stop the dulera now and start the advair since the dulera is not controlling her copd.   Encouraged her to stop smoking altogether -Check labs before next appt -she is scheduled for influenza vaccination -pt to follow up 1 month to recheck bp and copd.  She is to contact office sooner prn

## 2020-09-12 ENCOUNTER — Other Ambulatory Visit: Payer: Self-pay | Admitting: Physician Assistant

## 2020-09-14 ENCOUNTER — Other Ambulatory Visit: Payer: Self-pay | Admitting: Physician Assistant

## 2020-09-19 ENCOUNTER — Other Ambulatory Visit: Payer: Self-pay

## 2020-09-19 ENCOUNTER — Encounter: Payer: Self-pay | Admitting: Orthopaedic Surgery

## 2020-09-19 ENCOUNTER — Ambulatory Visit (INDEPENDENT_AMBULATORY_CARE_PROVIDER_SITE_OTHER): Payer: Self-pay | Admitting: Orthopaedic Surgery

## 2020-09-19 VITALS — BP 175/105 | HR 86 | Ht 66.0 in | Wt 200.0 lb

## 2020-09-19 DIAGNOSIS — M542 Cervicalgia: Secondary | ICD-10-CM

## 2020-09-19 MED ORDER — HYDROCODONE-ACETAMINOPHEN 5-325 MG PO TABS: 1.0000 | ORAL_TABLET | ORAL | 0 refills | Status: AC | PRN

## 2020-09-19 MED ORDER — NAPROXEN 500 MG PO TABS: 500.0000 mg | ORAL_TABLET | Freq: Two times a day (BID) | ORAL | 5 refills | Status: DC

## 2020-09-19 NOTE — Patient Instructions (Signed)
Physical therapy has been ordered for you at [facility]. They should call you to schedule, [phone] is the phone number to call , if you want to call to schedule if you call it may be quicker

## 2020-09-19 NOTE — Progress Notes (Signed)
Judy Leonard, female DOB:[birth year], 54 y.o. OXB:[id number]  Chief Complaint  Judy presents with  . Neck Pain    HPI  Judy Leonard is a 54 y.o. female who has neck pain.  She is worse. She is awakened around 2:30 to 3 am most days with neck pain.  She has no paresthesias.  She has been taking the Mobic with little help.  She is taking the Zanaflex which helps some.  She has no new trauma.    I will have her go to PT.  She may need to see neurosurgeon if not improved.  I will stop the Mobic and begin Naprosyn.  I will call in pain medicine.   Body mass index is 32.28 kg/m.  ROS  Review of Systems  Constitutional: Positive for activity change.  Musculoskeletal: Positive for arthralgias, myalgias and neck pain.  All other systems reviewed and are negative.   All other systems reviewed and are negative.  The following is a summary of the past history medically, past history surgically, known current medicines, social history and family history.  This information is gathered electronically by the computer from prior information and documentation.  I review this each visit and have found including this information at this point in the chart is beneficial and informative.    History reviewed. No pertinent past medical history.  Past Surgical History:  Procedure Laterality Date  . APPENDECTOMY  age 22    Family History  Problem Relation Age of Onset  . Diabetes Mother   . Kidney disease Mother   . Hypertension Mother   . Heart failure Mother     Social History Social History   Tobacco Use  . Smoking status: Light Tobacco Smoker    Packs/day: 0.25    Years: 31.00    Pack years: 7.75  . Smokeless tobacco: Never Used  . Tobacco comment: 1-2 cig months  Vaping Use  . Vaping Use: Never used  Substance Use Topics  . Alcohol use: Yes    Comment: occasionally  . Drug use: Not Currently    No Known Allergies  Current Outpatient Medications   Medication Sig Dispense Refill  . benzonatate (TESSALON) 100 MG capsule TAKE 1 TO 2 CAPSULES BY MOUTH EVERY 8 HOURS AS NEEDED FOR COUGH 20 capsule 3  . Fluticasone-Salmeterol (ADVAIR DISKUS) 100-50 MCG/DOSE AEPB Inhale 1 puff into the lungs in the morning and at bedtime. (Judy not taking: Reported on 08/08/2020) 60 each 1  . HYDROcodone-acetaminophen (NORCO/VICODIN) 5-325 MG tablet Take 1 tablet by mouth every 4 (four) hours as needed for up to 5 days for moderate pain. 30 tablet 0  . ibuprofen (ADVIL) 200 MG tablet Take 200 mg by mouth every 6 (six) hours as needed.    Marland Kitchen losartan (COZAAR) 50 MG tablet Take 1 tablet (50 mg total) by mouth daily. 90 tablet 0  . meloxicam (MOBIC) 15 MG tablet Take 1 tablet (15 mg total) by mouth daily as needed for pain. (Judy not taking: Reported on 08/30/2020) 30 tablet 3  . naproxen (NAPROSYN) 500 MG tablet Take 1 tablet (500 mg total) by mouth 2 (two) times daily with a meal. 60 tablet 5  . PROVENTIL HFA 108 (90 Base) MCG/ACT inhaler INHALE 2 PUFFS BY MOUTH EVERY 6 HOURS AS NEEDED FOR COUGHING, WHEEZING, OR SHORTNESS OF BREATH 6.7 g 1  . tiZANidine (ZANAFLEX) 4 MG tablet One by mouth every night before bed as needed for spasm 30 tablet 1  No current facility-administered medications for this visit.     Physical Exam  Blood pressure (!) 175/105, pulse 86, height 5\' 6"  (1.676 m), weight 200 lb (90.7 kg), last menstrual period 04/28/2015.  Constitutional: overall normal hygiene, normal nutrition, well developed, normal grooming, normal body habitus. Assistive device:none  Musculoskeletal: gait and station Limp none, muscle tone and strength are normal, no tremors or atrophy is present.  .  Neurological: coordination overall normal.  Deep tendon reflex/nerve stretch intact.  Sensation normal.  Cranial nerves II-XII intact.   Skin:   Normal overall no scars, lesions, ulcers or rashes. No psoriasis.  Psychiatric: Alert and oriented x 3.  Recent memory  intact, remote memory unclear.  Normal mood and affect. Well groomed.  Good eye contact.  Cardiovascular: overall no swelling, no varicosities, no edema bilaterally, normal temperatures of the legs and arms, no clubbing, cyanosis and good capillary refill.  Lymphatic: palpation is normal.  Neck has full ROM and NV intact.  All other systems reviewed and are negative   The Judy has been educated about the nature of the problem(s) and counseled on treatment options.  The Judy appeared to understand what I have discussed and is in agreement with it.  Encounter Diagnosis  Name Primary?  . Neck pain Yes    PLAN Call if any problems.  Precautions discussed.   Return to clinic 2 weeks   Begin PT.  Begin Naprosyn  Begin Norco 5  I have reviewed the 04/30/2015 Controlled Substance Reporting System web site prior to prescribing narcotic medicine for this Judy.   Electronically Signed [place], MD 9/21/20219:03 AM

## 2020-10-02 ENCOUNTER — Other Ambulatory Visit (HOSPITAL_COMMUNITY)
Admission: RE | Admit: 2020-10-02 | Discharge: 2020-10-02 | Disposition: A | Discharge status: Home / Self Care | Payer: Self-pay | Source: Ambulatory Visit | Attending: Physician Assistant | Admitting: Physician Assistant

## 2020-10-02 DIAGNOSIS — R7989 Other specified abnormal findings of blood chemistry: Secondary | ICD-10-CM | POA: Insufficient documentation

## 2020-10-02 DIAGNOSIS — R7309 Other abnormal glucose: Secondary | ICD-10-CM | POA: Insufficient documentation

## 2020-10-02 DIAGNOSIS — I10 Essential (primary) hypertension: Secondary | ICD-10-CM | POA: Insufficient documentation

## 2020-10-02 DIAGNOSIS — E785 Hyperlipidemia, unspecified: Secondary | ICD-10-CM | POA: Insufficient documentation

## 2020-10-02 DIAGNOSIS — Z131 Encounter for screening for diabetes mellitus: Secondary | ICD-10-CM | POA: Insufficient documentation

## 2020-10-02 LAB — COMPREHENSIVE METABOLIC PANEL
ALT: 56 U/L — ABNORMAL HIGH (ref 0–44)
AST: 74 U/L — ABNORMAL HIGH (ref 15–41)
Albumin: 4 g/dL (ref 3.5–5.0)
Alkaline Phosphatase: 97 U/L (ref 38–126)
Anion gap: 10 (ref 5–15)
BUN: 11 mg/dL (ref 6–20)
CO2: 29 mmol/L (ref 22–32)
Calcium: 9.5 mg/dL (ref 8.9–10.3)
Chloride: 102 mmol/L (ref 98–111)
Creatinine, Ser: 0.62 mg/dL (ref 0.44–1.00)
GFR calc Af Amer: >60 mL/min (ref >60)
GFR calc non Af Amer: >60 mL/min (ref >60)
Glucose, Bld: 102 mg/dL — ABNORMAL HIGH (ref 70–99)
Potassium: 4.4 mmol/L (ref 3.5–5.1)
Sodium: 141 mmol/L (ref 135–145)
Total Bilirubin: 0.9 mg/dL (ref 0.3–1.2)
Total Protein: 7.3 g/dL (ref 6.5–8.1)

## 2020-10-02 LAB — LIPID PANEL
Cholesterol: 190 mg/dL (ref 0–200)
HDL: 46 mg/dL (ref >40)
LDL Cholesterol: 91 mg/dL (ref 0–99)
Total CHOL/HDL Ratio: 4.1 RATIO
Triglycerides: 264 mg/dL — ABNORMAL HIGH (ref <150)
VLDL: 53 mg/dL — ABNORMAL HIGH (ref 0–40)

## 2020-10-02 LAB — HEMOGLOBIN A1C
Hgb A1c MFr Bld: 5.7 % — ABNORMAL HIGH (ref 4.8–5.6)
Mean Plasma Glucose: 116.89 mg/dL

## 2020-10-03 ENCOUNTER — Encounter (HOSPITAL_COMMUNITY): Payer: Self-pay | Admitting: Physical Therapy

## 2020-10-03 ENCOUNTER — Encounter: Payer: Self-pay | Admitting: Physician Assistant

## 2020-10-03 ENCOUNTER — Ambulatory Visit: Payer: Medicaid Other | Admitting: Physician Assistant

## 2020-10-03 ENCOUNTER — Other Ambulatory Visit: Payer: Self-pay

## 2020-10-03 ENCOUNTER — Ambulatory Visit (HOSPITAL_COMMUNITY): Payer: Self-pay | Attending: Orthopaedic Surgery | Admitting: Physical Therapy

## 2020-10-03 VITALS — BP 140/80 | HR 95 | Temp 97.7°F | Ht 66.0 in | Wt 200.7 lb

## 2020-10-03 DIAGNOSIS — J449 Chronic obstructive pulmonary disease, unspecified: Secondary | ICD-10-CM

## 2020-10-03 DIAGNOSIS — R7989 Other specified abnormal findings of blood chemistry: Secondary | ICD-10-CM

## 2020-10-03 DIAGNOSIS — E785 Hyperlipidemia, unspecified: Secondary | ICD-10-CM

## 2020-10-03 DIAGNOSIS — R293 Abnormal posture: Secondary | ICD-10-CM | POA: Insufficient documentation

## 2020-10-03 DIAGNOSIS — I10 Essential (primary) hypertension: Secondary | ICD-10-CM

## 2020-10-03 DIAGNOSIS — M546 Pain in thoracic spine: Secondary | ICD-10-CM | POA: Insufficient documentation

## 2020-10-03 DIAGNOSIS — M542 Cervicalgia: Secondary | ICD-10-CM | POA: Insufficient documentation

## 2020-10-03 MED ORDER — LOSARTAN POTASSIUM 100 MG PO TABS: 100.0000 mg | ORAL_TABLET | Freq: Every day | ORAL | 1 refills | Status: DC

## 2020-10-03 NOTE — Therapy (Signed)
[facility] [address] [place], [place], [address] Phone: [phone]   Fax:  [phone]  Physical Therapy Evaluation  Patient Details  Name: Judy Leonard MRN: [id number] Date of Birth: 11-Mar-1966 Referring Provider (PT): Darreld Mclean MD   Encounter Date: 10/03/2020   PT End of Session - 10/03/20 1524    Visit Number 1    Number of Visits 8    Date for PT Re-Evaluation 10/31/20    Authorization Type CAFA    PT Start Time 1450    PT Stop Time 1523    PT Time Calculation (min) 33 min    Activity Tolerance Patient tolerated treatment well    Behavior During Therapy [facility] for tasks assessed/performed           History reviewed. No pertinent past medical history.  Past Surgical History:  Procedure Laterality Date  . APPENDECTOMY  age 25    There were no vitals filed for this visit.    Subjective Assessment - 10/03/20 1452    Subjective Patient is a 54 y.o. female who presents to physical therapy with c/o neck pain. Patient states she is back with the same problem. Patient states she is taking naproxen and something else. She states she is having arthritis. Patient states she stopped coming last time because she thought she was only supposed to come in for 6 visits. Patient states increased symptoms with sleeping and lying which it hard to sleep. Meds make symptoms better for several hours. Mopping and household cores make symptoms worse. Patient states her main goal is to decrease inflammation in her neck for decreased pain. She does not do her prior exercises very often. She also has pain throughout her back and muscle spasms all over. She has neck pain, UT pain  and thoracic spine pain. This has been going on for 2 years about.    Limitations House hold activities;Other (comment)   sleeping   Patient Stated Goals decrease inflammation in her neck for decreased pain    Currently in Pain? Yes    Pain Score 5     Pain Location  Neck              [facility] PT Assessment - 10/03/20 0001      Assessment   Medical Diagnosis Neck Pain    Referring Provider (PT) Darreld Mclean MD    Onset Date/Surgical Date 10/03/18    Next MD Visit Thursday    Prior Therapy Yes, earlier this year      Precautions   Precautions None      Restrictions   Weight Bearing Restrictions No      Balance Screen   Has the patient fallen in the past 6 months Yes    How many times? 2    Has the patient had a decrease in activity level because of a fear of falling?  No    Is the patient reluctant to leave their home because of a fear of falling?  No      Prior Function   Level of Independence Independent    Vocation Other (comment)   filing for disability     Cognition   Overall Cognitive Status Within Functional Limits for tasks assessed      Observation/Other Assessments   Observations Ambulates without AD    Focus on Therapeutic Outcomes (FOTO)  30% limited      Sensation   Light Touch Appears Intact  Posture/Postural Control   Posture/Postural Control Postural limitations    Postural Limitations Rounded Shoulders;Forward head      ROM / Strength   AROM / PROM / Strength AROM;Strength      AROM   AROM Assessment Site Cervical    Cervical Flexion 0% slight pull    Cervical Extension 0% limited    Cervical - Right Side Bend 25% limited pain on L UT    Cervical - Left Side Bend 25% limited favors flexion    Cervical - Right Rotation 25% limited    Cervical - Left Rotation 0% limited      Strength   Strength Assessment Site Shoulder;Elbow;Wrist;Hand    Right/Left Shoulder Right;Left    Right Shoulder ABduction 4/5    Left Shoulder Flexion 4/5    Right/Left Elbow Right;Left    Right Elbow Flexion 5/5    Right Elbow Extension 5/5    Left Elbow Flexion 5/5    Left Elbow Extension 4/5    Right/Left Wrist Right;Left    Right Wrist Flexion 5/5    Left Wrist Flexion 5/5    Right/Left hand Right;Left    Right Hand  Gross Grasp Functional    Left Hand Gross Grasp Functional      Palpation   Palpation comment TTP biteral UT, cervical paraspinals, suboccipitals                      Objective measurements completed on examination: See above findings.               PT Education - 10/03/20 1452    Education Details Patient educated on exam findings, POC, scope of PT, needing to transition to HEP, HEP compliance    Person(s) Educated Patient    Methods Explanation;Demonstration    Comprehension Verbalized understanding;Returned demonstration            PT Short Term Goals - 10/03/20 1530      PT SHORT TERM GOAL #1   Title Patient will be independent with initial HEP to improve functional outcomes    Time 2    Period Weeks    Status New    Target Date 10/17/20      PT SHORT TERM GOAL #2   Title Patient will report at least 50% overall improvement in subjective complaint to indicate improvement in ability to perform ADLs.    Time 2    Period Weeks    Status New    Target Date 10/17/20             PT Long Term Goals - 10/03/20 1530      PT LONG TERM GOAL #1   Title Patient will report at least 75% overall improvement in subjective complaint to indicate improvement in ability to perform ADLs.    Time 4    Period Weeks    Status New    Target Date 10/31/20      PT LONG TERM GOAL #2   Title Patient will improve FOTO score by 5 points to indicate improvement in functional outcomes    Time 4    Period Weeks    Status New    Target Date 10/31/20      PT LONG TERM GOAL #3   Title Patient will demonstrate at least 25% improvement in cervical ROM in all restricted planes for improved ability to move head while  driving.    Time 4    Period Weeks  Status New    Target Date 10/31/20                  Plan - 10/03/20 1525    Clinical Impression Statement Patient is a 54 y.o. female who presents to physical therapy with c/o cervical and thoracic spine  pain. She presents with pain limited deficits in cervical strength, ROM, endurance, postural impairments, and functional mobility with ADL. She is having to modify and restrict ADL as indicated by FOTO score as well as subjective information and objective measures which is affecting overall participation. Patient will benefit from skilled physical therapy in order to improve function and reduce impairment.    Personal Factors and Comorbidities Comorbidity 3+;Behavior Pattern;Social Background;Time since onset of injury/illness/exacerbation;Fitness;Past/Current Experience    Comorbidities hx work injury, chronic pain, HTN, obesity, COPD    Examination-Activity Limitations Bed Mobility;Sleep;Carry;Lift;Reach Overhead    Examination-Participation Restrictions Cleaning;Community Activity;Driving;Occupation;Volunteer;Yard Work;Shop    Stability/Clinical Decision Making Stable/Uncomplicated    Clinical Decision Making Low    Rehab Potential Fair    PT Frequency 2x / week    PT Duration 4 weeks    PT Treatment/Interventions ADLs/Self Care Home Management;Aquatic Therapy;Canalith Repostioning;Cryotherapy;Biofeedback;Electrical Stimulation;Iontophoresis 4mg /ml Dexamethasone;Moist Heat;Traction;Ultrasound;Parrafin;Fluidtherapy;DME Instruction;Gait training;Stair training;Functional mobility training;Therapeutic activities;Therapeutic exercise;Balance training;Neuromuscular re-education;Patient/family education;Manual techniques;Orthotic Fit/Training;Joint Manipulations;Spinal Manipulations;Scar mobilization;Passive range of motion;Dry needling;Energy conservation    PT Next Visit Plan begin postural strengthing and cervical/thoracic mobility exercises and progress as tolerated; possibly restart with HEP from last round of PT    PT Home Exercise Plan initiate next session    Consulted and Agree with Plan of Care Patient           Patient will benefit from skilled therapeutic intervention in order to improve  the following deficits and impairments:  Decreased balance, Decreased activity tolerance, Decreased endurance, Decreased strength, Decreased mobility, Decreased range of motion, Postural dysfunction, Improper body mechanics, Pain, Obesity  Visit Diagnosis: Cervicalgia  Pain in thoracic spine  Abnormal posture     Problem List Patient Active Problem List   Diagnosis Date Noted  . Tobacco use disorder 04/20/2019    3:33 PM, 10/03/20 12/03/20 PT, DPT Physical Therapist at [facility]   [place] [place] [facility] [address] [place], [place], [place] Phone: [phone]   Fax:  [phone]  Name: Judy Leonard MRN: [id number] Date of Birth: November 12, 1966

## 2020-10-03 NOTE — Progress Notes (Signed)
BP 140/80   Pulse 95   Temp 97.7 F (36.5 C)   Ht 5\' 6"  (1.676 m)   Wt 200 lb 11.2 oz (91 kg)   LMP 04/28/2015   SpO2 95%   BMI 32.39 kg/m    Subjective:    Patient ID: Judy Leonard, female    DOB: [birth year], 54 y.o.   MRN: [id number]  HPI: Judy Leonard is a 54 y.o. female presenting on 10/03/2020 for Hypertension, COPD, and Hyperlipidemia   HPI   Pt had a negative covid 19 screening questionnaire.    Pt is 53yoF who presents for follow up HTN.  She says she is doing well.  She says she feels better since her BP is down some.   She still has an occassional cigarette.    She is seeing orthopedics for her neck pain.     Relevant past medical, surgical, family and social history reviewed and updated as indicated. Interim medical history since our last visit reviewed. Allergies and medications reviewed and updated.  CURRENT MEDS: advair 100 diskus Hydrocodone IBU Naproxen proventil zanaflex Tessalon Losartan 50mg   Review of Systems  Per HPI unless specifically indicated above     Objective:    BP 140/80   Pulse 95   Temp 97.7 F (36.5 C)   Ht 5\' 6"  (1.676 m)   Wt 200 lb 11.2 oz (91 kg)   LMP 04/28/2015   SpO2 95%   BMI 32.39 kg/m   Wt Readings from Last 3 Encounters:  10/03/20 200 lb 11.2 oz (91 kg)  09/19/20 200 lb (90.7 kg)  08/08/20 197 lb (89.4 kg)    Physical Exam Vitals reviewed.  Constitutional:      General: She is not in acute distress.    Appearance: She is well-developed. She is not ill-appearing.  HENT:     Head: Normocephalic and atraumatic.  Cardiovascular:     Rate and Rhythm: Normal rate and regular rhythm.  Pulmonary:     Effort: Pulmonary effort is normal.     Breath sounds: Normal breath sounds.  Abdominal:     General: Bowel sounds are normal.     Palpations: Abdomen is soft. There is no mass.     Tenderness: There is no abdominal tenderness.  Musculoskeletal:     Cervical back: Neck supple.     Right  lower leg: No edema.     Left lower leg: No edema.  Lymphadenopathy:     Cervical: No cervical adenopathy.  Skin:    General: Skin is warm and dry.  Neurological:     Mental Status: She is alert and oriented to person, place, and time.  Psychiatric:        Behavior: Behavior normal.        Results for orders placed or performed during the hospital encounter of 10/02/20  Hemoglobin A1c  Result Value Ref Range   Hgb A1c MFr Bld 5.7 (H) 4.8 - 5.6 %   Mean Plasma Glucose 116.89 mg/dL  Lipid panel  Result Value Ref Range   Cholesterol 190 0 - 200 mg/dL   Triglycerides 09/21/20 (H) <150 mg/dL   HDL 46 10/08/20 mg/dL   Total CHOL/HDL Ratio 4.1 RATIO   VLDL 53 (H) 0 - 40 mg/dL   LDL Cholesterol 91 0 - 99 mg/dL  Comprehensive metabolic panel  Result Value Ref Range   Sodium 141 135 - 145 mmol/L   Potassium 4.4 3.5 - 5.1 mmol/L   Chloride 102  98 - 111 mmol/L   CO2 29 22 - 32 mmol/L   Glucose, Bld 102 (H) 70 - 99 mg/dL   BUN 11 6 - 20 mg/dL   Creatinine, Ser 5.46 0.44 - 1.00 mg/dL   Calcium 9.5 8.9 - 27.0 mg/dL   Total Protein 7.3 6.5 - 8.1 g/dL   Albumin 4.0 3.5 - 5.0 g/dL   AST 74 (H) 15 - 41 U/L   ALT 56 (H) 0 - 44 U/L   Alkaline Phosphatase 97 38 - 126 U/L   Total Bilirubin 0.9 0.3 - 1.2 mg/dL   GFR calc non Af Amer >60 >60 mL/min   GFR calc Af Amer >60 >60 mL/min   Anion gap 10 5 - 15      Assessment & Plan:    Encounter Diagnoses  Name Primary?  . Essential hypertension Yes  . Chronic obstructive pulmonary disease, unspecified COPD type (HCC)   . Hyperlipidemia, unspecified hyperlipidemia type   . Elevated LFTs      -reviewed labs with pt  -Increase losartan.  Pt to monitor her bp at home and notify office if it gets too high or low -she Has FP medicaid.  She is encouraged to call gyn to update PAP -pt to follow up 65months.  She is to contact office sooner prn

## 2020-10-05 ENCOUNTER — Other Ambulatory Visit: Payer: Self-pay

## 2020-10-05 ENCOUNTER — Ambulatory Visit (INDEPENDENT_AMBULATORY_CARE_PROVIDER_SITE_OTHER): Payer: Self-pay | Admitting: Orthopaedic Surgery

## 2020-10-05 ENCOUNTER — Encounter: Payer: Self-pay | Admitting: Orthopaedic Surgery

## 2020-10-05 VITALS — Ht 66.0 in | Wt 199.0 lb

## 2020-10-05 DIAGNOSIS — F17210 Nicotine dependence, cigarettes, uncomplicated: Secondary | ICD-10-CM

## 2020-10-05 DIAGNOSIS — M542 Cervicalgia: Secondary | ICD-10-CM

## 2020-10-05 NOTE — Progress Notes (Signed)
Patient Judy Leonard, female DOB:12/03/1966, 55 y.o. DDU:[id number]  Chief Complaint  Patient presents with  . Neck Pain    HPI  Tashiya A. Freeman is a 54 y.o. female who has neck pain.  She has been to PT and is scheduled for next week.  She has less pain but it is still there.  She tolerated the Naprosyn well.  She is pleased with her progress.  She has no numbness.   Body mass index is 32.12 kg/m.  ROS  Review of Systems  Constitutional: Positive for activity change.  Musculoskeletal: Positive for arthralgias, myalgias and neck pain.  All other systems reviewed and are negative.   All other systems reviewed and are negative.  The following is a summary of the past history medically, past history surgically, known current medicines, social history and family history.  This information is gathered electronically by the computer from prior information and documentation.  I review this each visit and have found including this information at this point in the chart is beneficial and informative.    History reviewed. No pertinent past medical history.  Past Surgical History:  Procedure Laterality Date  . APPENDECTOMY  age 71    Family History  Problem Relation Age of Onset  . Diabetes Mother   . Kidney disease Mother   . Hypertension Mother   . Heart failure Mother     Social History Social History   Tobacco Use  . Smoking status: Light Tobacco Smoker    Packs/day: 0.25    Years: 31.00    Pack years: 7.75  . Smokeless tobacco: Never Used  . Tobacco comment: 1-2 cig months  Vaping Use  . Vaping Use: Never used  Substance Use Topics  . Alcohol use: Yes    Comment: occasionally  . Drug use: Not Currently    No Known Allergies  Current Outpatient Medications  Medication Sig Dispense Refill  . Fluticasone-Salmeterol (ADVAIR DISKUS) 100-50 MCG/DOSE AEPB Inhale 1 puff into the lungs in the morning and at bedtime. 60 each 1  . HYDROcodone-acetaminophen  (NORCO/VICODIN) 5-325 MG tablet Take 1 tablet by mouth every 4 (four) hours as needed for moderate pain.    Marland Kitchen ibuprofen (ADVIL) 200 MG tablet Take 200 mg by mouth every 6 (six) hours as needed.    Marland Kitchen losartan (COZAAR) 100 MG tablet Take 1 tablet (100 mg total) by mouth daily. 90 tablet 1  . naproxen (NAPROSYN) 500 MG tablet Take 1 tablet (500 mg total) by mouth 2 (two) times daily with a meal. 60 tablet 5  . PROVENTIL HFA 108 (90 Base) MCG/ACT inhaler INHALE 2 PUFFS BY MOUTH EVERY 6 HOURS AS NEEDED FOR COUGHING, WHEEZING, OR SHORTNESS OF BREATH 6.7 g 1  . tiZANidine (ZANAFLEX) 4 MG tablet One by mouth every night before bed as needed for spasm 30 tablet 1  . benzonatate (TESSALON) 100 MG capsule TAKE 1 TO 2 CAPSULES BY MOUTH EVERY 8 HOURS AS NEEDED FOR COUGH (Patient not taking: Reported on 10/03/2020) 20 capsule 3   No current facility-administered medications for this visit.     Physical Exam  Height 5\' 6"  (1.676 m), weight 199 lb (90.3 kg), last menstrual period 04/28/2015.  Constitutional: overall normal hygiene, normal nutrition, well developed, normal grooming, normal body habitus. Assistive device:none  Musculoskeletal: gait and station Limp none, muscle tone and strength are normal, no tremors or atrophy is present.  .  Neurological: coordination overall normal.  Deep tendon reflex/nerve stretch intact.  Sensation  normal.  Cranial nerves II-XII intact.   Skin:   Normal overall no scars, lesions, ulcers or rashes. No psoriasis.  Psychiatric: Alert and oriented x 3.  Recent memory intact, remote memory unclear.  Normal mood and affect. Well groomed.  Good eye contact.  Cardiovascular: overall no swelling, no varicosities, no edema bilaterally, normal temperatures of the legs and arms, no clubbing, cyanosis and good capillary refill.  Lymphatic: palpation is normal.  All other systems reviewed and are negative   The patient has been educated about the nature of the problem(s) and  counseled on treatment options.  The patient appeared to understand what I have discussed and is in agreement with it.  Encounter Diagnoses  Name Primary?  . Neck pain Yes  . Nicotine dependence, cigarettes, uncomplicated     PLAN Call if any problems.  Precautions discussed.  Continue current medications.   Return to clinic 3 weeks   Electronically Signed Darreld Mclean, MD 10/7/202110:12 AM

## 2020-10-12 ENCOUNTER — Ambulatory Visit (HOSPITAL_COMMUNITY): Payer: Self-pay | Admitting: Physical Therapy

## 2020-10-16 ENCOUNTER — Ambulatory Visit (HOSPITAL_COMMUNITY): Payer: Self-pay | Admitting: Physical Therapy

## 2020-10-16 ENCOUNTER — Encounter (HOSPITAL_COMMUNITY): Payer: Self-pay | Admitting: Physical Therapy

## 2020-10-16 ENCOUNTER — Other Ambulatory Visit: Payer: Self-pay

## 2020-10-16 DIAGNOSIS — R293 Abnormal posture: Secondary | ICD-10-CM

## 2020-10-16 DIAGNOSIS — M542 Cervicalgia: Secondary | ICD-10-CM

## 2020-10-16 DIAGNOSIS — M546 Pain in thoracic spine: Secondary | ICD-10-CM

## 2020-10-16 NOTE — Therapy (Signed)
[facility] [address] [place], [place], [address] Phone: [phone]   Fax:  [phone]  Physical Therapy Treatment  Patient Details  Name: Judy Leonard MRN: [id number] Date of Birth: 08/24/1966 Referring Provider (PT): Darreld Mclean MD   Encounter Date: 10/16/2020   PT End of Session - 10/16/20 1449    Visit Number 2    Number of Visits 8    Date for PT Re-Evaluation 10/31/20    Authorization Type CAFA    PT Start Time 1452    PT Stop Time 1530    PT Time Calculation (min) 38 min    Activity Tolerance Patient tolerated treatment well    Behavior During Therapy [facility] for tasks assessed/performed           History reviewed. No pertinent past medical history.  Past Surgical History:  Procedure Laterality Date  . APPENDECTOMY  age 54    There were no vitals filed for this visit.   Subjective Assessment - 10/16/20 1450    Subjective Patient states her neck is acting up from the weather. She has been able to do some of her old exercises. She has been sleeping better.    Limitations House hold activities;Other (comment)   sleeping   Patient Stated Goals decrease inflammation in her neck for decreased pain    Currently in Pain? Yes    Pain Score 4     Pain Location Neck                             [facility] Adult PT Treatment/Exercise - 10/16/20 0001      Exercises   Exercises Neck      Neck Exercises: Standing   Neck Retraction 10 reps;5 secs    Neck Retraction Limitations at wall with towel; 2 sets    Other Standing Exercises pec stretch at doorway 3x20 second holds    Other Standing Exercises y lift off wall 10x 5 second holds      Neck Exercises: Seated   Neck Retraction 10 reps;3 secs    Neck Retraction Limitations 2 sets     Shoulder Rolls 20 reps;Backwards    Other Seated Exercise t/sp extension over chair 10x 5 second holds    Other Seated Exercise upper trap stretch 3x20 second holds  bilateral                  PT Education - 10/16/20 1448    Education Details Patient educated on HEP, exercise mechanics    Person(s) Educated Patient    Methods Explanation;Demonstration    Comprehension Verbalized understanding;Returned demonstration            PT Short Term Goals - 10/03/20 1530      PT SHORT TERM GOAL #1   Title Patient will be independent with initial HEP to improve functional outcomes    Time 2    Period Weeks    Status New    Target Date 10/17/20      PT SHORT TERM GOAL #2   Title Patient will report at least 50% overall improvement in subjective complaint to indicate improvement in ability to perform ADLs.    Time 2    Period Weeks    Status New    Target Date 10/17/20             PT Long Term Goals - 10/03/20 1530  PT LONG TERM GOAL #1   Title Patient will report at least 75% overall improvement in subjective complaint to indicate improvement in ability to perform ADLs.    Time 4    Period Weeks    Status New    Target Date 10/31/20      PT LONG TERM GOAL #2   Title Patient will improve FOTO score by 5 points to indicate improvement in functional outcomes    Time 4    Period Weeks    Status New    Target Date 10/31/20      PT LONG TERM GOAL #3   Title Patient will demonstrate at least 25% improvement in cervical ROM in all restricted planes for improved ability to move head while  driving.    Time 4    Period Weeks    Status New    Target Date 10/31/20                 Plan - 10/16/20 1449    Clinical Impression Statement Patient tolerates thoracic extension exercise for improving thoracic mobility without c/o symptoms. She requires frequent verbal cueing and demonstration initially for cervical retractions in seated which improves with repetition. She has difficulty with shoulder rolls and tends to over use upper trap which improves with frequent cueing for middle and lower trap activation. Patient tends to over  activate upper trap with cervical retraction isometrics which improves when patient is aware.  Patient notes fatigue in cervical musculature at end of session but states improvement in symptoms. Patient will continue to benefit from skilled physical therapy in order to reduce impairment and improve function.    Personal Factors and Comorbidities Comorbidity 3+;Behavior Pattern;Social Background;Time since onset of injury/illness/exacerbation;Fitness;Past/Current Experience    Comorbidities hx work injury, chronic pain, HTN, obesity, COPD    Examination-Activity Limitations Bed Mobility;Sleep;Carry;Lift;Reach Overhead    Examination-Participation Restrictions Cleaning;Community Activity;Driving;Occupation;Volunteer;Yard Work;Shop    Stability/Clinical Decision Making Stable/Uncomplicated    Rehab Potential Fair    PT Frequency 2x / week    PT Duration 4 weeks    PT Treatment/Interventions ADLs/Self Care Home Management;Aquatic Therapy;Canalith Repostioning;Cryotherapy;Biofeedback;Electrical Stimulation;Iontophoresis 4mg /ml Dexamethasone;Moist Heat;Traction;Ultrasound;Parrafin;Fluidtherapy;DME Instruction;Gait training;Stair training;Functional mobility training;Therapeutic activities;Therapeutic exercise;Balance training;Neuromuscular re-education;Patient/family education;Manual techniques;Orthotic Fit/Training;Joint Manipulations;Spinal Manipulations;Scar mobilization;Passive range of motion;Dry needling;Energy conservation    PT Next Visit Plan continue postural strengthing and cervical/thoracic mobility exercises and progress as tolerated; possibly restart with HEP from last round of PT; possibly resistance band strengthening    PT Home Exercise Plan 10/18 cervical retraction, scap retraction/depression    Consulted and Agree with Plan of Care Patient           Patient will benefit from skilled therapeutic intervention in order to improve the following deficits and impairments:  Decreased  balance, Decreased activity tolerance, Decreased endurance, Decreased strength, Decreased mobility, Decreased range of motion, Postural dysfunction, Improper body mechanics, Pain, Obesity  Visit Diagnosis: Cervicalgia  Pain in thoracic spine  Abnormal posture     Problem List Patient Active Problem List   Diagnosis Date Noted  . Tobacco use disorder 04/20/2019    3:31 PM, 10/16/20 10/18/20 PT, DPT Physical Therapist at [facility]   [facility] [address] [place], [place], [place] Phone: [phone]   Fax:  [phone]  Name: Jannel Lynne. Hiltunen MRN: Wyline Mood Date of Birth: 05/25/1966

## 2020-10-16 NOTE — Patient Instructions (Signed)
Access Code: [email or web address] URL: [email or web address] Date: 10/16/2020 Prepared by: Greig Castilla Jeffrie Stander  Exercises Seated Cervical Retraction - 1 x daily - 7 x weekly - 2 sets - 10 reps - 3 second hold Standing Scapular Retraction - 1 x daily - 7 x weekly - 2 sets - 20 reps Standing Isometric Cervical Retraction with Chin Tucks and Ball at [employer] - 1 x daily - 7 x weekly - 2 sets - 10 reps - 5 second holds hold

## 2020-10-18 ENCOUNTER — Ambulatory Visit (HOSPITAL_COMMUNITY): Payer: Self-pay

## 2020-10-18 ENCOUNTER — Other Ambulatory Visit: Payer: Self-pay

## 2020-10-18 ENCOUNTER — Encounter (HOSPITAL_COMMUNITY): Payer: Self-pay

## 2020-10-18 DIAGNOSIS — M546 Pain in thoracic spine: Secondary | ICD-10-CM

## 2020-10-18 DIAGNOSIS — R293 Abnormal posture: Secondary | ICD-10-CM

## 2020-10-18 DIAGNOSIS — M542 Cervicalgia: Secondary | ICD-10-CM

## 2020-10-18 NOTE — Therapy (Signed)
[facility] [address] [place], [place], [address] Phone: [phone]   Fax:  [phone]  Physical Therapy Treatment  Patient Details  Name: Judy Leonard MRN: [id number] Date of Birth: 1966-02-10 Referring Provider (PT): Darreld Mclean MD   Encounter Date: 10/18/2020   PT End of Session - 10/18/20 1453    Visit Number 3    Number of Visits 8    Date for PT Re-Evaluation 10/31/20    Authorization Type CAFA    PT Start Time 1446    PT Stop Time 1525    PT Time Calculation (min) 39 min    Activity Tolerance Patient tolerated treatment well    Behavior During Therapy [facility] for tasks assessed/performed           History reviewed. No pertinent past medical history.  Past Surgical History:  Procedure Laterality Date  . APPENDECTOMY  age 45    There were no vitals filed for this visit.   Subjective Assessment - 10/18/20 1449    Subjective Pt reports her neck is feeling good today, does have some tightness in mid back.  Reports compliance wiht HEP and has improved mobility and sleeping better.    Patient Stated Goals decrease inflammation in her neck for decreased pain    Currently in Pain? No/denies                             [facility] Adult PT Treatment/Exercise - 10/18/20 0001      Posture/Postural Control   Posture/Postural Control Postural limitations    Postural Limitations Rounded Shoulders;Forward head      Exercises   Exercises Neck      Neck Exercises: Theraband   Shoulder Extension Red;10 reps    Shoulder Extension Limitations 2 sets; HEP    Rows 10 reps;Red    Rows Limitations 2 sets; HEP      Neck Exercises: Standing   Neck Retraction 10 reps;5 secs    Neck Retraction Limitations at wall with towel; 2 sets    Other Standing Exercises y lift off wall 10x 5 second holds      Neck Exercises: Seated   Other Seated Exercise 3D thoracic excursion      Neck Exercises: Stretches   Upper  Trapezius Stretch 2 reps;30 seconds    Chest Stretch 2 reps;30 seconds                    PT Short Term Goals - 10/03/20 1530      PT SHORT TERM GOAL #1   Title Patient will be independent with initial HEP to improve functional outcomes    Time 2    Period Weeks    Status New    Target Date 10/17/20      PT SHORT TERM GOAL #2   Title Patient will report at least 50% overall improvement in subjective complaint to indicate improvement in ability to perform ADLs.    Time 2    Period Weeks    Status New    Target Date 10/17/20             PT Long Term Goals - 10/03/20 1530      PT LONG TERM GOAL #1   Title Patient will report at least 75% overall improvement in subjective complaint to indicate improvement in ability to perform ADLs.    Time 4    Period Weeks  Status New    Target Date 10/31/20      PT LONG TERM GOAL #2   Title Patient will improve FOTO score by 5 points to indicate improvement in functional outcomes    Time 4    Period Weeks    Status New    Target Date 10/31/20      PT LONG TERM GOAL #3   Title Patient will demonstrate at least 25% improvement in cervical ROM in all restricted planes for improved ability to move head while  driving.    Time 4    Period Weeks    Status New    Target Date 10/31/20                 Plan - 10/18/20 1500    Clinical Impression Statement Began session with 3D thoracic excursion to address tightness in mid back with reports of relief.  Continued spinal mobility exercises and progressed postural strengthening.  Pt able to complete all exercises without c/o increased pain, did reports some popping wiht neck mobility exercises that was pain free.  Did c/o burning that was resolved with cervical retraction.  Improved postural awareness with minimal upper trap activaiton during cervical retraction against wall.  Added theraband for postural strengthening, able to complete all exercises with good form and given  theraband to add to HEP.    Personal Factors and Comorbidities Comorbidity 3+;Behavior Pattern;Social Background;Time since onset of injury/illness/exacerbation;Fitness;Past/Current Experience    Comorbidities hx work injury, chronic pain, HTN, obesity, COPD    Examination-Activity Limitations Bed Mobility;Sleep;Carry;Lift;Reach Overhead    Examination-Participation Restrictions Cleaning;Community Activity;Driving;Occupation;Volunteer;Yard Work;Shop    Stability/Clinical Decision Making Stable/Uncomplicated    Clinical Decision Making Low    Rehab Potential Fair    PT Frequency 2x / week    PT Duration 4 weeks    PT Treatment/Interventions ADLs/Self Care Home Management;Aquatic Therapy;Canalith Repostioning;Cryotherapy;Biofeedback;Electrical Stimulation;Iontophoresis 4mg /ml Dexamethasone;Moist Heat;Traction;Ultrasound;Parrafin;Fluidtherapy;DME Instruction;Gait training;Stair training;Functional mobility training;Therapeutic activities;Therapeutic exercise;Balance training;Neuromuscular re-education;Patient/family education;Manual techniques;Orthotic Fit/Training;Joint Manipulations;Spinal Manipulations;Scar mobilization;Passive range of motion;Dry needling;Energy conservation    PT Next Visit Plan continue postural strengthing and cervical/thoracic mobility exercises and progress as tolerated; possibly restart with HEP from last round of PT; possibly resistance band strengthening    PT Home Exercise Plan 10/18 cervical retraction, scap retraction/depression; 10/20: RTB rows and extension           Patient will benefit from skilled therapeutic intervention in order to improve the following deficits and impairments:  Decreased balance, Decreased activity tolerance, Decreased endurance, Decreased strength, Decreased mobility, Decreased range of motion, Postural dysfunction, Improper body mechanics, Pain, Obesity  Visit Diagnosis: Pain in thoracic spine  Abnormal  posture  Cervicalgia     Problem List Patient Active Problem List   Diagnosis Date Noted  . Tobacco use disorder 04/20/2019   04/22/2019, LPTA/CLT; CBIS [phone]  [phone] 10/18/2020, 3:46 PM  [facility] [address] [place], [place], [place] Phone: [phone]   Fax:  [phone]  Name: Judy Leonard MRN: Wyline Mood Date of Birth: 06-19-1966

## 2020-10-20 ENCOUNTER — Ambulatory Visit (HOSPITAL_COMMUNITY): Payer: Self-pay

## 2020-10-23 ENCOUNTER — Encounter (HOSPITAL_COMMUNITY): Payer: Self-pay

## 2020-10-23 ENCOUNTER — Ambulatory Visit (HOSPITAL_COMMUNITY): Payer: Self-pay

## 2020-10-23 ENCOUNTER — Other Ambulatory Visit: Payer: Self-pay

## 2020-10-23 DIAGNOSIS — M542 Cervicalgia: Secondary | ICD-10-CM

## 2020-10-23 DIAGNOSIS — M546 Pain in thoracic spine: Secondary | ICD-10-CM

## 2020-10-23 DIAGNOSIS — R293 Abnormal posture: Secondary | ICD-10-CM

## 2020-10-23 NOTE — Therapy (Signed)
[facility] [address] [place], [place], [address] Phone: [phone]   Fax:  [phone]  Physical Therapy Treatment  Patient Details  Name: Judy Leonard MRN: [id number] Date of Birth: 1966-12-17 Referring Provider (PT): Darreld Mclean MD   Encounter Date: 10/23/2020   PT End of Session - 10/23/20 1318    Visit Number 4    Number of Visits 8    Date for PT Re-Evaluation 10/31/20    Authorization Type CAFA    PT Start Time 1313    PT Stop Time 1352    PT Time Calculation (min) 39 min    Activity Tolerance Patient tolerated treatment well    Behavior During Therapy [facility] for tasks assessed/performed           History reviewed. No pertinent past medical history.  Past Surgical History:  Procedure Laterality Date  . APPENDECTOMY  age 27    There were no vitals filed for this visit.   Subjective Assessment - 10/23/20 1314    Subjective Pt stated she is feeling good today, no reports of pain does have some tightness in neck.    Patient Stated Goals decrease inflammation in her neck for decreased pain    Currently in Pain? No/denies    Pain Orientation Left;Posterior    Pain Descriptors / Indicators Tightness    Pain Onset More than a month ago    Pain Frequency Constant    Aggravating Factors  weather, standing too long, cleaning    Pain Relieving Factors propping up, laying on her side                             [facility] Adult PT Treatment/Exercise - 10/23/20 0001      Posture/Postural Control   Posture/Postural Control Postural limitations    Postural Limitations Rounded Shoulders;Forward head      Exercises   Exercises Neck      Neck Exercises: Theraband   Shoulder Extension 15 reps;Red    Shoulder Extension Limitations Good mechanics    Rows 15 reps;Red    Rows Limitations Good form and mechanics      Neck Exercises: Standing   Neck Retraction 10 reps;5 secs    Neck Retraction  Limitations at wall with towel; 2 sets    Wall Push Ups 10 reps;15 reps    Upper Extremity Flexion with Stabilization Flexion;10 reps    UE Flexion with Stabilization Limitations with chin tuck    Other Standing Exercises y lift off wall 10x 5 second holds      Neck Exercises: Seated   Other Seated Exercise 3D thoracic excursion      Neck Exercises: Stretches   Upper Trapezius Stretch 2 reps;30 seconds    Corner Stretch 2 reps;30 seconds   Lt posterior neck pain                   PT Short Term Goals - 10/03/20 1530      PT SHORT TERM GOAL #1   Title Patient will be independent with initial HEP to improve functional outcomes    Time 2    Period Weeks    Status New    Target Date 10/17/20      PT SHORT TERM GOAL #2   Title Patient will report at least 50% overall improvement in subjective complaint to indicate improvement in ability to perform ADLs.    Time 2  Period Weeks    Status New    Target Date 10/17/20             PT Long Term Goals - 10/03/20 1530      PT LONG TERM GOAL #1   Title Patient will report at least 75% overall improvement in subjective complaint to indicate improvement in ability to perform ADLs.    Time 4    Period Weeks    Status New    Target Date 10/31/20      PT LONG TERM GOAL #2   Title Patient will improve FOTO score by 5 points to indicate improvement in functional outcomes    Time 4    Period Weeks    Status New    Target Date 10/31/20      PT LONG TERM GOAL #3   Title Patient will demonstrate at least 25% improvement in cervical ROM in all restricted planes for improved ability to move head while  driving.    Time 4    Period Weeks    Status New    Target Date 10/31/20                 Plan - 10/23/20 1338    Clinical Impression Statement Reviewed form with newest HEP, pt able to demonstrate appropriate mechanics with all theraband exercises.  Session focus with postural strengthening and spinal mobility.   Progressed postural stability with additional chin tuck and UE flexion and wall push-ups.  Pt tolerated well towards session.    Personal Factors and Comorbidities Comorbidity 3+;Behavior Pattern;Social Background;Time since onset of injury/illness/exacerbation;Fitness;Past/Current Experience    Comorbidities hx work injury, chronic pain, HTN, obesity, COPD    Examination-Activity Limitations Bed Mobility;Sleep;Carry;Lift;Reach Overhead    Examination-Participation Restrictions Cleaning;Community Activity;Driving;Occupation;Volunteer;Yard Work;Shop    Stability/Clinical Decision Making Stable/Uncomplicated    Clinical Decision Making Low    Rehab Potential Fair    PT Frequency 2x / week    PT Duration 4 weeks    PT Treatment/Interventions ADLs/Self Care Home Management;Aquatic Therapy;Canalith Repostioning;Cryotherapy;Biofeedback;Electrical Stimulation;Iontophoresis 4mg /ml Dexamethasone;Moist Heat;Traction;Ultrasound;Parrafin;Fluidtherapy;DME Instruction;Gait training;Stair training;Functional mobility training;Therapeutic activities;Therapeutic exercise;Balance training;Neuromuscular re-education;Patient/family education;Manual techniques;Orthotic Fit/Training;Joint Manipulations;Spinal Manipulations;Scar mobilization;Passive range of motion;Dry needling;Energy conservation    PT Next Visit Plan continue postural strengthing and cervical/thoracic mobility exercises and progress as tolerated; possibly restart with HEP from last round of PT; possibly resistance band strengthening    PT Home Exercise Plan 10/18 cervical retraction, scap retraction/depression; 10/20: RTB rows and extension; 10/25: UT stretch           Patient will benefit from skilled therapeutic intervention in order to improve the following deficits and impairments:  Decreased balance, Decreased activity tolerance, Decreased endurance, Decreased strength, Decreased mobility, Decreased range of motion, Postural dysfunction, Improper  body mechanics, Pain, Obesity  Visit Diagnosis: Pain in thoracic spine  Abnormal posture  Cervicalgia     Problem List Patient Active Problem List   Diagnosis Date Noted  . Tobacco use disorder 04/20/2019   04/22/2019, LPTA/CLT; CBIS [phone]  [phone] 10/23/2020, 1:57 PM  [facility] [address] [place], [place], [place] Phone: [phone]   Fax:  [phone]  Name: Judy Leonard MRN: Wyline Mood Date of Birth: [birth year]

## 2020-10-23 NOTE — Patient Instructions (Signed)
Flexibility: Upper Trapezius Stretch    Gently grasp right side of head while reaching behind back with other hand. Tilt head away until a gentle stretch is felt. Hold 30 seconds. Repeat 3 times per set. Do 2 sets per day.  http://orth.exer.us/341   Copyright  VHI. All rights reserved.   

## 2020-10-24 ENCOUNTER — Ambulatory Visit (INDEPENDENT_AMBULATORY_CARE_PROVIDER_SITE_OTHER): Payer: Self-pay | Admitting: Orthopaedic Surgery

## 2020-10-24 ENCOUNTER — Encounter: Payer: Self-pay | Admitting: Orthopaedic Surgery

## 2020-10-24 VITALS — BP 154/96 | HR 77 | Ht 66.0 in | Wt 195.0 lb

## 2020-10-24 DIAGNOSIS — F17210 Nicotine dependence, cigarettes, uncomplicated: Secondary | ICD-10-CM

## 2020-10-24 DIAGNOSIS — M542 Cervicalgia: Secondary | ICD-10-CM

## 2020-10-24 MED ORDER — HYDROCODONE-ACETAMINOPHEN 5-325 MG PO TABS: ORAL_TABLET | ORAL | 0 refills | Status: DC

## 2020-10-24 NOTE — Progress Notes (Signed)
Patient [id number] Judy Leonard, female DOB:03-31-1966, 54 y.o. ATF:[id number]  Chief Complaint  Patient presents with  . Neck Pain    HPI  Judy Leonard is a 54 y.o. female who has neck pain. She has been going to PT.  I have reviewed the notes.  She is somewhat better but still has pain on these cool mornings.  She is doing her exercises at home. She needs refill of medicine and I will do it.   Body mass index is 31.47 kg/m.  ROS  Review of Systems  Constitutional: Positive for activity change.  Musculoskeletal: Positive for arthralgias, myalgias and neck pain.  All other systems reviewed and are negative.   All other systems reviewed and are negative.  The following is a summary of the past history medically, past history surgically, known current medicines, social history and family history.  This information is gathered electronically by the computer from prior information and documentation.  I review this each visit and have found including this information at this point in the chart is beneficial and informative.    History reviewed. No pertinent past medical history.  Past Surgical History:  Procedure Laterality Date  . APPENDECTOMY  age 53    Family History  Problem Relation Age of Onset  . Diabetes Mother   . Kidney disease Mother   . Hypertension Mother   . Heart failure Mother     Social History Social History   Tobacco Use  . Smoking status: Former Smoker    Packs/day: 0.25    Years: 31.00    Pack years: 7.75  . Smokeless tobacco: Never Used  . Tobacco comment: 1-2 cig months  Vaping Use  . Vaping Use: Never used  Substance Use Topics  . Alcohol use: Yes    Comment: occasionally  . Drug use: Not Currently    No Known Allergies  Current Outpatient Medications  Medication Sig Dispense Refill  . benzonatate (TESSALON) 100 MG capsule TAKE 1 TO 2 CAPSULES BY MOUTH EVERY 8 HOURS AS NEEDED FOR COUGH 20 capsule 3  . Fluticasone-Salmeterol  (ADVAIR DISKUS) 100-50 MCG/DOSE AEPB Inhale 1 puff into the lungs in the morning and at bedtime. 60 each 1  . ibuprofen (ADVIL) 200 MG tablet Take 200 mg by mouth every 6 (six) hours as needed.    Marland Kitchen losartan (COZAAR) 100 MG tablet Take 1 tablet (100 mg total) by mouth daily. 90 tablet 1  . naproxen (NAPROSYN) 500 MG tablet Take 1 tablet (500 mg total) by mouth 2 (two) times daily with a meal. 60 tablet 5  . PROVENTIL HFA 108 (90 Base) MCG/ACT inhaler INHALE 2 PUFFS BY MOUTH EVERY 6 HOURS AS NEEDED FOR COUGHING, WHEEZING, OR SHORTNESS OF BREATH 6.7 g 1  . tiZANidine (ZANAFLEX) 4 MG tablet One by mouth every night before bed as needed for spasm 30 tablet 1  . HYDROcodone-acetaminophen (NORCO/VICODIN) 5-325 MG tablet One tablet every six hours for pain.  Limit 7 days. 28 tablet 0   No current facility-administered medications for this visit.     Physical Exam  Blood pressure (!) 154/96, pulse 77, height 5\' 6"  (1.676 m), weight 195 lb (88.5 kg), last menstrual period 04/28/2015.  Constitutional: overall normal hygiene, normal nutrition, well developed, normal grooming, normal body habitus. Assistive device:none  Musculoskeletal: gait and station Limp none, muscle tone and strength are normal, no tremors or atrophy is present.  .  Neurological: coordination overall normal.  Deep tendon reflex/nerve stretch intact.  Sensation  normal.  Cranial nerves II-XII intact.   Skin:   Normal overall no scars, lesions, ulcers or rashes. No psoriasis.  Psychiatric: Alert and oriented x 3.  Recent memory intact, remote memory unclear.  Normal mood and affect. Well groomed.  Good eye contact.  Cardiovascular: overall no swelling, no varicosities, no edema bilaterally, normal temperatures of the legs and arms, no clubbing, cyanosis and good capillary refill.  Neck has good ROM today, NV intact.  Lymphatic: palpation is normal.  All other systems reviewed and are negative   The patient has been educated  about the nature of the problem(s) and counseled on treatment options.  The patient appeared to understand what I have discussed and is in agreement with it.  Encounter Diagnoses  Name Primary?  . Neck pain Yes  . Nicotine dependence, cigarettes, uncomplicated     PLAN Call if any problems.  Precautions discussed.  Continue current medications.   Return to clinic 3 weeks   Continue PT.  I have reviewed the [place] Controlled Substance Reporting System web site prior to prescribing narcotic medicine for this patient.   Electronically Signed Darreld Mclean, MD 10/26/202110:54 AM

## 2020-10-25 ENCOUNTER — Ambulatory Visit (HOSPITAL_COMMUNITY): Payer: Self-pay

## 2020-10-25 ENCOUNTER — Other Ambulatory Visit: Payer: Self-pay

## 2020-10-25 ENCOUNTER — Encounter (HOSPITAL_COMMUNITY): Payer: Self-pay

## 2020-10-25 DIAGNOSIS — M546 Pain in thoracic spine: Secondary | ICD-10-CM

## 2020-10-25 DIAGNOSIS — R293 Abnormal posture: Secondary | ICD-10-CM

## 2020-10-25 DIAGNOSIS — M542 Cervicalgia: Secondary | ICD-10-CM

## 2020-10-25 NOTE — Therapy (Signed)
[facility] [address] [place], [place], [address] Phone: [phone]   Fax:  [phone]  Physical Therapy Treatment  Patient Details  Name: Judy Leonard MRN: [id number] Date of Birth: Jan 22, 1966 Referring Provider (PT): Darreld Mclean MD   Encounter Date: 10/25/2020   PT End of Session - 10/25/20 1541    Visit Number 5    Number of Visits 8    Date for PT Re-Evaluation 10/31/20    Authorization Type CAFA    PT Start Time 1531    PT Stop Time 1609    PT Time Calculation (min) 38 min    Activity Tolerance Patient tolerated treatment well    Behavior During Therapy [facility] for tasks assessed/performed           History reviewed. No pertinent past medical history.  Past Surgical History:  Procedure Laterality Date  . APPENDECTOMY  age 54    There were no vitals filed for this visit.   Subjective Assessment - 10/25/20 1534    Subjective Pt reports she has difficulty at night on left side of neck, nagging pain and tightness in neck.  Reports she can tell the weather does affect her neck pain.    Patient Stated Goals decrease inflammation in her neck for decreased pain    Currently in Pain? Yes    Pain Score 4     Pain Location Neck    Pain Orientation Posterior;Left    Pain Descriptors / Indicators Nagging;Tightness    Pain Onset More than a month ago    Pain Frequency Constant    Aggravating Factors  weather, standing too long, cleaning    Pain Relieving Factors propping up, laying on her side                             [facility] Adult PT Treatment/Exercise - 10/25/20 0001      Posture/Postural Control   Posture/Postural Control Postural limitations    Postural Limitations Rounded Shoulders;Forward head      Exercises   Exercises Neck      Neck Exercises: Standing   Neck Retraction 10 reps;5 secs    Neck Retraction Limitations at wall with towel; 2 sets    Wall Push Ups 10 reps;15 reps     Upper Extremity Flexion with Stabilization Flexion;10 reps    UE Flexion with Stabilization Limitations with chin tuck    Other Standing Exercises y lift off wall 10x 5 second holds      Neck Exercises: Seated   Neck Retraction 10 reps;3 secs    X to V 10 reps    W Back 10 reps    Other Seated Exercise 3D thoracic excursion      Neck Exercises: Stretches   Upper Trapezius Stretch 2 reps;30 seconds    Levator Stretch 2 reps;30 seconds    Chest Stretch 2 reps;30 seconds                    PT Short Term Goals - 10/03/20 1530      PT SHORT TERM GOAL #1   Title Patient will be independent with initial HEP to improve functional outcomes    Time 2    Period Weeks    Status New    Target Date 10/17/20      PT SHORT TERM GOAL #2   Title Patient will report at least 50% overall improvement in  subjective complaint to indicate improvement in ability to perform ADLs.    Time 2    Period Weeks    Status New    Target Date 10/17/20             PT Long Term Goals - 10/03/20 1530      PT LONG TERM GOAL #1   Title Patient will report at least 75% overall improvement in subjective complaint to indicate improvement in ability to perform ADLs.    Time 4    Period Weeks    Status New    Target Date 10/31/20      PT LONG TERM GOAL #2   Title Patient will improve FOTO score by 5 points to indicate improvement in functional outcomes    Time 4    Period Weeks    Status New    Target Date 10/31/20      PT LONG TERM GOAL #3   Title Patient will demonstrate at least 25% improvement in cervical ROM in all restricted planes for improved ability to move head while  driving.    Time 4    Period Weeks    Status New    Target Date 10/31/20                 Plan - 10/25/20 1547    Clinical Impression Statement Session focus wiht postural strenghtening and cervical mobility.  Reviewed form with UT stretch, pt able to demonstrate appropriate form, encouraged to hold stretch  for longer duration for muscle lengthening wiht verbalized understanding.  Added neck stretches with cueing for form and hold time for mobility.  Pt tolerated well to session with no reports of increased pain.  Did c/o SOB at EOS, used inhaler to assist.    Personal Factors and Comorbidities Comorbidity 3+;Behavior Pattern;Social Background;Time since onset of injury/illness/exacerbation;Fitness;Past/Current Experience    Comorbidities hx work injury, chronic pain, HTN, obesity, COPD    Examination-Activity Limitations Bed Mobility;Sleep;Carry;Lift;Reach Overhead    Examination-Participation Restrictions Cleaning;Community Activity;Driving;Occupation;Volunteer;Yard Work;Shop    Stability/Clinical Decision Making Stable/Uncomplicated    Clinical Decision Making Low    Rehab Potential Fair    PT Frequency 2x / week    PT Duration 4 weeks    PT Treatment/Interventions ADLs/Self Care Home Management;Aquatic Therapy;Canalith Repostioning;Cryotherapy;Biofeedback;Electrical Stimulation;Iontophoresis 4mg /ml Dexamethasone;Moist Heat;Traction;Ultrasound;Parrafin;Fluidtherapy;DME Instruction;Gait training;Stair training;Functional mobility training;Therapeutic activities;Therapeutic exercise;Balance training;Neuromuscular re-education;Patient/family education;Manual techniques;Orthotic Fit/Training;Joint Manipulations;Spinal Manipulations;Scar mobilization;Passive range of motion;Dry needling;Energy conservation    PT Next Visit Plan Review goals next session.    PT Home Exercise Plan 10/18 cervical retraction, scap retraction/depression; 10/20: RTB rows and extension; 10/25: UT stretch           Patient will benefit from skilled therapeutic intervention in order to improve the following deficits and impairments:  Decreased balance, Decreased activity tolerance, Decreased endurance, Decreased strength, Decreased mobility, Decreased range of motion, Postural dysfunction, Improper body mechanics, Pain,  Obesity  Visit Diagnosis: Pain in thoracic spine  Abnormal posture  Cervicalgia     Problem List Patient Active Problem List   Diagnosis Date Noted  . Tobacco use disorder 04/20/2019   04/22/2019, LPTA/CLT; CBIS [phone]  [phone] 10/25/2020, 4:06 PM  [facility] [address] [place], [place], [place] Phone: [phone]   Fax:  [phone]  Name: Rooney Gladwin. Mariani MRN: Wyline Mood Date of Birth: 07-03-1966

## 2020-10-30 ENCOUNTER — Telehealth (HOSPITAL_COMMUNITY): Payer: Self-pay | Admitting: Physical Therapy

## 2020-10-30 ENCOUNTER — Ambulatory Visit (HOSPITAL_COMMUNITY): Payer: Self-pay | Admitting: Physical Therapy

## 2020-10-30 NOTE — Telephone Encounter (Signed)
pt called to cx this appt due to her blood pressure

## 2020-11-01 ENCOUNTER — Other Ambulatory Visit: Payer: Self-pay

## 2020-11-01 ENCOUNTER — Ambulatory Visit (HOSPITAL_COMMUNITY): Payer: Self-pay | Attending: Orthopaedic Surgery | Admitting: Physical Therapy

## 2020-11-01 ENCOUNTER — Encounter (HOSPITAL_COMMUNITY): Payer: Self-pay | Admitting: Physical Therapy

## 2020-11-01 ENCOUNTER — Other Ambulatory Visit: Payer: Self-pay | Admitting: Physician Assistant

## 2020-11-01 DIAGNOSIS — R293 Abnormal posture: Secondary | ICD-10-CM

## 2020-11-01 DIAGNOSIS — M542 Cervicalgia: Secondary | ICD-10-CM

## 2020-11-01 DIAGNOSIS — M546 Pain in thoracic spine: Secondary | ICD-10-CM

## 2020-11-01 NOTE — Therapy (Signed)
[place], [place], [address] Phone: [phone]   Fax:  [phone]  Physical Therapy Treatment  Patient Details  Name: Judy Leonard MRN: [id number] Date of Birth: [birth year] Referring Provider (PT): Sanjuana Kava MD   Encounter Date: 11/01/2020   PHYSICAL THERAPY DISCHARGE SUMMARY  Visits from Start of Care: 6  Current functional level related to goals / functional outcomes: See below   Remaining deficits: See below   Education / Equipment: See below  Plan: Patient agrees to discharge.  Patient goals were partially met. Patient is being discharged due to being pleased with the current functional level.  ?????transition to HEP        PT End of Session - 11/01/20 1453    Visit Number 6    Number of Visits 8    Date for PT Re-Evaluation 10/31/20    Authorization Type CAFA    PT Start Time 1452    PT Stop Time 1513    PT Time Calculation (min) 21 min    Activity Tolerance Patient tolerated treatment well    Behavior During Therapy [facility] for tasks assessed/performed           History reviewed. No pertinent past medical history.  Past Surgical History:  Procedure Laterality Date   APPENDECTOMY  age 18    There were no vitals filed for this visit.   Subjective Assessment - 11/01/20 1453    Subjective Patient states she was having some neck pain this morning with cold weather and trouble breathing overall.  She has been working on her exercises at home. Her blood pressure has been off and they have been working on dosage. Patient states 40-50% improvement with physical therapy intervention.    Patient Stated Goals decrease inflammation in her neck for decreased pain    Currently in Pain? Yes    Pain Score 5     Pain Location Neck    Pain Orientation Left;Posterior    Pain Descriptors / Indicators Tightness;Sore    Pain Type Chronic pain    Pain Onset More than a month ago               [facility] PT Assessment - 11/01/20 0001      Assessment   Medical Diagnosis Neck Pain    Referring Provider (PT) Sanjuana Kava MD    Onset Date/Surgical Date 10/03/18    Next MD Visit Thursday    Prior Therapy Yes, earlier this year      Precautions   Precautions None      Restrictions   Weight Bearing Restrictions No      Prior Function   Level of Independence Independent      Cognition   Overall Cognitive Status Within Functional Limits for tasks assessed      Observation/Other Assessments   Observations Ambulates without AD    Focus on Therapeutic Outcomes (FOTO)  42% limited      Posture/Postural Control   Posture/Postural Control Postural limitations    Postural Limitations Rounded Shoulders;Forward head      AROM   Cervical Flexion 0% slight pull    Cervical Extension 0% limited    Cervical - Right Side Bend 25% limited pain on L UT    Cervical - Left Side Bend 25% limited     Cervical - Right Rotation 0% limited    Cervical - Left Rotation 0% limited  PT Education - 11/01/20 1452    Education Details Patient educated on HEP, exercise mechanics, remaining active, returning to PT if needed    Methods Explanation;Demonstration    Comprehension Verbalized understanding;Returned demonstration            PT Short Term Goals - 11/01/20 1508      PT SHORT TERM GOAL #1   Title Patient will be independent with initial HEP to improve functional outcomes    Time 2    Period Weeks    Status Achieved    Target Date 10/17/20      PT SHORT TERM GOAL #2   Title Patient will report at least 50% overall improvement in subjective complaint to indicate improvement in ability to perform ADLs.    Time 2    Period Weeks    Status Achieved    Target Date 10/17/20             PT Long Term Goals - 11/01/20 1513      PT LONG TERM GOAL #1   Title Patient will report at least 75% overall improvement in  subjective complaint to indicate improvement in ability to perform ADLs.    Time 4    Period Weeks    Status Not Met      PT LONG TERM GOAL #2   Title Patient will improve FOTO score by 5 points to indicate improvement in functional outcomes    Time 4    Period Weeks    Status Not Met      PT LONG TERM GOAL #3   Title Patient will demonstrate at least 25% improvement in cervical ROM in all restricted planes for improved ability to move head while  driving.    Time 4    Period Weeks    Status Not Met                 Plan - 11/01/20 1453    Clinical Impression Statement Patient has met 2/2 short term goals and 0/3 long term goals with ability to complete HEP and improvement in symptoms. Remaining goals not met due to continued symptoms, decreased ROM, and impaired activity tolerance/functional mobility as indicated by FOTO score. Despite patient reporting improving symptoms, FOTO indicates increased limitation. Patient showing improving ROM compared to initial examination as well. Patient feels confident in ability to manage symptoms with HEP and is ready to be discharged to complete her HEP independently. Patient discharged from therapy at this time.    Personal Factors and Comorbidities Comorbidity 3+;Behavior Pattern;Social Background;Time since onset of injury/illness/exacerbation;Fitness;Past/Current Experience    Comorbidities hx work injury, chronic pain, HTN, obesity, COPD    Examination-Activity Limitations Bed Mobility;Sleep;Carry;Lift;Reach Overhead    Examination-Participation Restrictions Cleaning;Community Activity;Driving;Occupation;Volunteer;Yard Work;Shop    Stability/Clinical Decision Making Stable/Uncomplicated    Rehab Potential Fair    PT Frequency --    PT Duration --    PT Treatment/Interventions ADLs/Self Care Home Management;Aquatic Therapy;Canalith Repostioning;Cryotherapy;Biofeedback;Electrical Stimulation;Iontophoresis 65m/ml Dexamethasone;Moist  Heat;Traction;Ultrasound;Parrafin;Fluidtherapy;DME Instruction;Gait training;Stair training;Functional mobility training;Therapeutic activities;Therapeutic exercise;Balance training;Neuromuscular re-education;Patient/family education;Manual techniques;Orthotic Fit/Training;Joint Manipulations;Spinal Manipulations;Scar mobilization;Passive range of motion;Dry needling;Energy conservation    PT Next Visit Plan n/a    PT Home Exercise Plan 10/18 cervical retraction, scap retraction/depression; 10/20: RTB rows and extension; 10/25: UT stretch           Patient will benefit from skilled therapeutic intervention in order to improve the following deficits and impairments:  Decreased balance, Decreased activity tolerance, Decreased endurance, Decreased strength, Decreased mobility, Decreased  range of motion, Postural dysfunction, Improper body mechanics, Pain, Obesity  Visit Diagnosis: Pain in thoracic spine  Abnormal posture  Cervicalgia     Problem List Patient Active Problem List   Diagnosis Date Noted   Tobacco use disorder 04/20/2019     3:19 PM, 11/01/20 Mearl Latin PT, DPT Physical Therapist at [facility] [place], [place], [address] Phone: [phone]   Fax:  [phone]  Name: Judy Leonard MRN: [id number] Date of Birth: 1966/05/08

## 2020-11-08 ENCOUNTER — Other Ambulatory Visit: Payer: Self-pay | Admitting: Physician Assistant

## 2020-11-14 ENCOUNTER — Telehealth: Payer: Self-pay | Admitting: Orthopaedic Surgery

## 2020-11-14 ENCOUNTER — Other Ambulatory Visit: Payer: Self-pay

## 2020-11-14 ENCOUNTER — Ambulatory Visit (INDEPENDENT_AMBULATORY_CARE_PROVIDER_SITE_OTHER): Payer: Self-pay | Admitting: Orthopaedic Surgery

## 2020-11-14 ENCOUNTER — Encounter: Payer: Self-pay | Admitting: Orthopaedic Surgery

## 2020-11-14 VITALS — BP 138/86 | HR 78 | Ht 66.0 in | Wt 195.0 lb

## 2020-11-14 DIAGNOSIS — F17210 Nicotine dependence, cigarettes, uncomplicated: Secondary | ICD-10-CM

## 2020-11-14 DIAGNOSIS — M542 Cervicalgia: Secondary | ICD-10-CM

## 2020-11-14 MED ORDER — HYDROCODONE-ACETAMINOPHEN 5-325 MG PO TABS
ORAL_TABLET | ORAL | 0 refills | Status: DC
Start: 2020-11-14 — End: 2020-12-12

## 2020-11-14 NOTE — Progress Notes (Signed)
Patient Judy Leonard, female DOB:08/06/1966, 54 y.o. WGN:[id number]  Chief Complaint  Patient presents with  . Neck Pain    Neck pain.    HPI  Judy Leonard is a 54 y.o. female who has neck pain.  She has been discharged from PT.  She is doing her exercises and is better.  She has more pain on cold days. She has no numbness and no new trauma.   Body mass index is 31.47 kg/m.  ROS  Review of Systems  Constitutional: Positive for activity change.  Musculoskeletal: Positive for arthralgias, myalgias and neck pain.  All other systems reviewed and are negative.   All other systems reviewed and are negative.  The following is a summary of the past history medically, past history surgically, known current medicines, social history and family history.  This information is gathered electronically by the computer from prior information and documentation.  I review this each visit and have found including this information at this point in the chart is beneficial and informative.    History reviewed. No pertinent past medical history.  Past Surgical History:  Procedure Laterality Date  . APPENDECTOMY  age 33    Family History  Problem Relation Age of Onset  . Diabetes Mother   . Kidney disease Mother   . Hypertension Mother   . Heart failure Mother     Social History Social History   Tobacco Use  . Smoking status: Former Smoker    Packs/day: 0.25    Years: 31.00    Pack years: 7.75  . Smokeless tobacco: Never Used  . Tobacco comment: 1-2 cig months  Vaping Use  . Vaping Use: Never used  Substance Use Topics  . Alcohol use: Yes    Comment: occasionally  . Drug use: Not Currently    No Known Allergies  Current Outpatient Medications  Medication Sig Dispense Refill  . HYDROcodone-acetaminophen (NORCO/VICODIN) 5-325 MG tablet One tablet every six hours for pain.  Limit 7 days. 28 tablet 0  . ibuprofen (ADVIL) 200 MG tablet Take 200 mg by mouth every 6  (six) hours as needed.    Marland Kitchen losartan (COZAAR) 100 MG tablet Take 1 tablet (100 mg total) by mouth daily. 90 tablet 1  . naproxen (NAPROSYN) 500 MG tablet Take 1 tablet (500 mg total) by mouth 2 (two) times daily with a meal. 60 tablet 5  . PROVENTIL HFA 108 (90 Base) MCG/ACT inhaler INHALE 2 PUFFS BY MOUTH EVERY 6 HOURS AS NEEDED FOR COUGHING, WHEEZING, OR SHORTNESS OF BREATH 6.7 g 1  . tiZANidine (ZANAFLEX) 4 MG tablet One by mouth every night before bed as needed for spasm 30 tablet 1  . WIXELA INHUB 100-50 MCG/DOSE AEPB INHALE 1 PUFF BY MOUTH TWICE DAILY (EVERY 12 HOURS). RINSE MOUTH AFTER USE. - USE IN THE MORNING AND AT BEDTIME 60 each 1  . benzonatate (TESSALON) 100 MG capsule TAKE 1 TO 2 CAPSULES BY MOUTH EVERY 8 HOURS AS NEEDED FOR COUGH (Patient not taking: Reported on 11/14/2020) 20 capsule 3   No current facility-administered medications for this visit.     Physical Exam  Blood pressure 138/86, pulse 78, height 5\' 6"  (1.676 m), weight 195 lb (88.5 kg), last menstrual period 04/28/2015.  Constitutional: overall normal hygiene, normal nutrition, well developed, normal grooming, normal body habitus. Assistive device:none  Musculoskeletal: gait and station Limp none, muscle tone and strength are normal, no tremors or atrophy is present.  .  Neurological: coordination overall normal.  Deep tendon reflex/nerve stretch intact.  Sensation normal.  Cranial nerves II-XII intact.   Skin:   Normal overall no scars, lesions, ulcers or rashes. No psoriasis.  Psychiatric: Alert and oriented x 3.  Recent memory intact, remote memory unclear.  Normal mood and affect. Well groomed.  Good eye contact.  Cardiovascular: overall no swelling, no varicosities, no edema bilaterally, normal temperatures of the legs and arms, no clubbing, cyanosis and good capillary refill.  Lymphatic: palpation is normal.  Neck has full ROM.   All other systems reviewed and are negative   The patient has been  educated about the nature of the problem(s) and counseled on treatment options.  The patient appeared to understand what I have discussed and is in agreement with it.  Encounter Diagnoses  Name Primary?  . Neck pain Yes  . Nicotine dependence, cigarettes, uncomplicated     PLAN Call if any problems.  Precautions discussed.  Continue current medications.   Return to clinic 1 month   I have reviewed the [facility] Controlled Substance Reporting System web site prior to prescribing narcotic medicine for this patient.   Electronically Signed Darreld Mclean, MD 11/16/202110:56 AM

## 2020-11-14 NOTE — Telephone Encounter (Signed)
Patient inquired after today's office visit that she had a form for a complete physical. Reviewed form, and relayed that our clinic does not perform this type of evaluation, and that patient may check with her primary care provider, or with one of the urgent care locations. Asked if she may have some notes/records since Dr Hilda Lias is the provider she has mainly seen. Done per HIM/Release of information - patient picked up the copy of her notes.

## 2020-12-12 ENCOUNTER — Other Ambulatory Visit: Payer: Self-pay

## 2020-12-12 ENCOUNTER — Encounter: Payer: Self-pay | Admitting: Orthopaedic Surgery

## 2020-12-12 ENCOUNTER — Ambulatory Visit: Payer: Self-pay | Admitting: Orthopaedic Surgery

## 2020-12-12 VITALS — BP 180/108 | Temp 107.0°F | Ht 66.0 in | Wt 195.0 lb

## 2020-12-12 DIAGNOSIS — F17210 Nicotine dependence, cigarettes, uncomplicated: Secondary | ICD-10-CM

## 2020-12-12 DIAGNOSIS — M542 Cervicalgia: Secondary | ICD-10-CM

## 2020-12-12 MED ORDER — HYDROCODONE-ACETAMINOPHEN 5-325 MG PO TABS
ORAL_TABLET | ORAL | 0 refills | Status: DC
Start: 2020-12-12 — End: 2021-01-09

## 2020-12-12 MED ORDER — PREDNISONE 5 MG (21) PO TBPK: ORAL_TABLET | ORAL | 0 refills | Status: DC

## 2020-12-12 MED ORDER — TIZANIDINE HCL 4 MG PO TABS
ORAL_TABLET | ORAL | 1 refills | Status: DC
Start: 2020-12-12 — End: 2021-05-02

## 2020-12-12 NOTE — Progress Notes (Signed)
Patient Judy Leonard, female DOB:25-Feb-1966, 54 y.o. IFO:[id number]  Chief Complaint  Patient presents with  . Neck Pain    Everything is hurting this week   . Medication Refill    hydrocodone    HPI  Judy Leonard is a 54 y.o. female who has pain "all over today", her neck her back her legs. She is taking her medicine.  She is out of pain medicine.  She is doing her exercises.  She has no new trauma, no weakness.   Body mass index is 31.47 kg/m.  ROS  Review of Systems  Constitutional: Positive for activity change.  Musculoskeletal: Positive for arthralgias, myalgias and neck pain.  All other systems reviewed and are negative.   All other systems reviewed and are negative.  The following is a summary of the past history medically, past history surgically, known current medicines, social history and family history.  This information is gathered electronically by the computer from prior information and documentation.  I review this each visit and have found including this information at this point in the chart is beneficial and informative.    History reviewed. No pertinent past medical history.  Past Surgical History:  Procedure Laterality Date  . APPENDECTOMY  age 29    Family History  Problem Relation Age of Onset  . Diabetes Mother   . Kidney disease Mother   . Hypertension Mother   . Heart failure Mother     Social History Social History   Tobacco Use  . Smoking status: Former Smoker    Packs/day: 0.25    Years: 31.00    Pack years: 7.75    Types: Cigarettes    Quit date: 04/29/2020    Years since quitting: 0.6  . Smokeless tobacco: Never Used  . Tobacco comment: 1-2 cig months  Vaping Use  . Vaping Use: Never used  Substance Use Topics  . Alcohol use: Yes    Comment: occasionally  . Drug use: Not Currently    No Known Allergies  Current Outpatient Medications  Medication Sig Dispense Refill  . benzonatate (TESSALON) 100 MG capsule  TAKE 1 TO 2 CAPSULES BY MOUTH EVERY 8 HOURS AS NEEDED FOR COUGH (Patient not taking: Reported on 11/14/2020) 20 capsule 3  . HYDROcodone-acetaminophen (NORCO/VICODIN) 5-325 MG tablet One tablet every six hours for pain.  Limit 7 days. 28 tablet 0  . ibuprofen (ADVIL) 200 MG tablet Take 200 mg by mouth every 6 (six) hours as needed.    Marland Kitchen losartan (COZAAR) 100 MG tablet Take 1 tablet (100 mg total) by mouth daily. 90 tablet 1  . naproxen (NAPROSYN) 500 MG tablet Take 1 tablet (500 mg total) by mouth 2 (two) times daily with a meal. 60 tablet 5  . predniSONE (STERAPRED UNI-PAK 21 TAB) 5 MG (21) TBPK tablet Take 6 pills first day; 5 pills second day; 4 pills third day; 3 pills fourth day; 2 pills next day and 1 pill last day. 21 tablet 0  . PROVENTIL HFA 108 (90 Base) MCG/ACT inhaler INHALE 2 PUFFS BY MOUTH EVERY 6 HOURS AS NEEDED FOR COUGHING, WHEEZING, OR SHORTNESS OF BREATH 6.7 g 1  . tiZANidine (ZANAFLEX) 4 MG tablet One by mouth every night before bed as needed for spasm 30 tablet 1  . WIXELA INHUB 100-50 MCG/DOSE AEPB INHALE 1 PUFF BY MOUTH TWICE DAILY (EVERY 12 HOURS). RINSE MOUTH AFTER USE. - USE IN THE MORNING AND AT BEDTIME 60 each 1   No current  facility-administered medications for this visit.     Physical Exam  Blood pressure (!) 180/108, temperature (!) 107 F (41.7 C), height 5\' 6"  (1.676 m), weight 195 lb (88.5 kg), last menstrual period 04/28/2015.  Constitutional: overall normal hygiene, normal nutrition, well developed, normal grooming, normal body habitus. Assistive device:none  Musculoskeletal: gait and station Limp none, muscle tone and strength are normal, no tremors or atrophy is present.  .  Neurological: coordination overall normal.  Deep tendon reflex/nerve stretch intact.  Sensation normal.  Cranial nerves II-XII intact.   Skin:   Normal overall no scars, lesions, ulcers or rashes. No psoriasis.  Psychiatric: Alert and oriented x 3.  Recent memory intact, remote  memory unclear.  Normal mood and affect. Well groomed.  Good eye contact.  Cardiovascular: overall no swelling, no varicosities, no edema bilaterally, normal temperatures of the legs and arms, no clubbing, cyanosis and good capillary refill.  Lymphatic: palpation is normal.  Neck has full ROM.  NV intact.  All other systems reviewed and are negative   The patient has been educated about the nature of the problem(s) and counseled on treatment options.  The patient appeared to understand what I have discussed and is in agreement with it.  Encounter Diagnoses  Name Primary?  . Neck pain Yes  . Nicotine dependence, cigarettes, uncomplicated     PLAN Call if any problems.  Precautions discussed.  Continue current medications.   Return to clinic 1 month   I have reviewed the [facility] Controlled Substance Reporting System web site prior to prescribing narcotic medicine for this patient.   I will add prednisone, stop the Naprosyn while taking.  Electronically Signed [facility], MD 12/14/20212:08 PM

## 2021-01-03 ENCOUNTER — Other Ambulatory Visit: Payer: Self-pay

## 2021-01-03 ENCOUNTER — Ambulatory Visit: Payer: Medicaid Other | Admitting: Physician Assistant

## 2021-01-03 ENCOUNTER — Encounter: Payer: Self-pay | Admitting: Physician Assistant

## 2021-01-03 VITALS — BP 162/108 | HR 94 | Temp 97.1°F | Ht 66.0 in | Wt 206.5 lb

## 2021-01-03 DIAGNOSIS — I10 Essential (primary) hypertension: Secondary | ICD-10-CM

## 2021-01-03 MED ORDER — AMLODIPINE BESYLATE 5 MG PO TABS: 5.0000 mg | ORAL_TABLET | Freq: Every day | ORAL | 1 refills | Status: DC

## 2021-01-03 NOTE — Progress Notes (Signed)
BP (!) 162/108   Pulse 94   Temp (!) 97.1 F (36.2 C)   Ht 5\' 6"  (1.676 m)   Wt 206 lb 8 oz (93.7 kg)   LMP 04/28/2015   SpO2 96%   BMI 33.33 kg/m    Subjective:    Patient ID: Judy Leonard, female    DOB: 1965/12/31, 55 y.o.   MRN: [id number]  HPI: Judy Leonard is a 55 y.o. female presenting on 01/03/2021 for Hypertension   HPI     Pt had a negative covid 19 screening questionnaire.   Chief Complaint  Patient presents with  . Hypertension      She is done with Physical Therapy.  She is still seeing orthopedics.    She is checking bp at home and it is hgih there as well.    She is still having some numbness mostly in her arms.  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  She says she has not mentioned it to her orthopedist.  She says No more smoking     Relevant past medical, surgical, family and social history reviewed and updated as indicated. Interim medical history since our last visit reviewed. Allergies and medications reviewed and updated.    Current Outpatient Medications:  .  diphenhydrAMINE (BENADRYL) 25 MG tablet, Take 25 mg by mouth every 6 (six) hours as needed., Disp: , Rfl:  .  ibuprofen (ADVIL) 200 MG tablet, Take 200 mg by mouth every 6 (six) hours as needed., Disp: , Rfl:  .  losartan (COZAAR) 100 MG tablet, Take 1 tablet (100 mg total) by mouth daily., Disp: 90 tablet, Rfl: 1 .  Mometasone Furo-Formoterol Fum (DULERA IN), Inhale 2 puffs into the lungs as needed., Disp: , Rfl:  .  naproxen (NAPROSYN) 500 MG tablet, Take 1 tablet (500 mg total) by mouth 2 (two) times daily with a meal., Disp: 60 tablet, Rfl: 5 .  PROVENTIL HFA 108 (90 Base) MCG/ACT inhaler, INHALE 2 PUFFS BY MOUTH EVERY 6 HOURS AS NEEDED FOR COUGHING, WHEEZING, OR SHORTNESS OF BREATH, Disp: 6.7 g, Rfl: 1 .  tiZANidine (ZANAFLEX) 4 MG tablet, One by mouth every night before bed as needed for spasm, Disp: 30 tablet, Rfl: 1 .  WIXELA INHUB 100-50 MCG/DOSE AEPB,  INHALE 1 PUFF BY MOUTH TWICE DAILY (EVERY 12 HOURS). RINSE MOUTH AFTER USE. - USE IN THE MORNING AND AT BEDTIME, Disp: 60 each, Rfl: 1 .  HYDROcodone-acetaminophen (NORCO/VICODIN) 5-325 MG tablet, One tablet every six hours for pain.  Limit 7 days. (Patient not taking: Reported on 01/03/2021), Disp: 28 tablet, Rfl: 0    Review of Systems  Per HPI unless specifically indicated above     Objective:    BP (!) 162/108   Pulse 94   Temp (!) 97.1 F (36.2 C)   Ht 5\' 6"  (1.676 m)   Wt 206 lb 8 oz (93.7 kg)   LMP 04/28/2015   SpO2 96%   BMI 33.33 kg/m   Wt Readings from Last 3 Encounters:  01/03/21 206 lb 8 oz (93.7 kg)  12/12/20 195 lb (88.5 kg)  11/14/20 195 lb (88.5 kg)    Physical Exam Vitals reviewed.  Constitutional:      Appearance: She is well-developed and well-nourished.  HENT:     Head: Normocephalic and atraumatic.  Cardiovascular:     Rate and Rhythm: Normal rate and regular rhythm.  Pulmonary:     Effort: Pulmonary effort is normal.  Breath sounds: Normal breath sounds.  Abdominal:     General: Bowel sounds are normal.     Palpations: Abdomen is soft. There is no hepatosplenomegaly or mass.     Tenderness: There is no abdominal tenderness.  Musculoskeletal:        General: No edema.     Cervical back: Neck supple.     Right lower leg: No edema.     Left lower leg: No edema.  Lymphadenopathy:     Cervical: No cervical adenopathy.  Skin:    General: Skin is warm and dry.  Neurological:     Mental Status: She is alert and oriented to person, place, and time.  Psychiatric:        Mood and Affect: Mood and affect normal.        Behavior: Behavior normal.           Assessment & Plan:   Encounter Diagnosis  Name Primary?  . Essential hypertension Yes     -Continue losartan and add amlodipine -pt encouraged to discuss numb/tingling with her orthopedist -F/u 4 wk. check bmp before appt.  Pt to contact office sooner prn

## 2021-01-09 ENCOUNTER — Encounter: Payer: Self-pay | Admitting: Orthopaedic Surgery

## 2021-01-09 ENCOUNTER — Other Ambulatory Visit: Payer: Self-pay

## 2021-01-09 ENCOUNTER — Ambulatory Visit (INDEPENDENT_AMBULATORY_CARE_PROVIDER_SITE_OTHER): Payer: Self-pay | Admitting: Orthopaedic Surgery

## 2021-01-09 VITALS — BP 206/120 | HR 81 | Ht 66.0 in | Wt 206.0 lb

## 2021-01-09 DIAGNOSIS — M542 Cervicalgia: Secondary | ICD-10-CM

## 2021-01-09 DIAGNOSIS — F17210 Nicotine dependence, cigarettes, uncomplicated: Secondary | ICD-10-CM

## 2021-01-09 MED ORDER — HYDROCODONE-ACETAMINOPHEN 5-325 MG PO TABS
ORAL_TABLET | ORAL | 0 refills | Status: DC
Start: 2021-01-09 — End: 2021-01-31

## 2021-01-09 NOTE — Progress Notes (Signed)
Patient Judy Leonard, female DOB:1966-06-03, 55 y.o. DEY:[id number]  Chief Complaint  Patient presents with  . Neck Pain    HPI  Judy Leonard is a 55 y.o. female who has chronic neck pain.  She has had more pain with the cold weather.  She has no numbness, no trauma.  She is taking her medicine.   Body mass index is 33.25 kg/m.  ROS  Review of Systems  Constitutional: Positive for activity change.  Musculoskeletal: Positive for arthralgias, myalgias and neck pain.  All other systems reviewed and are negative.   All other systems reviewed and are negative.  The following is a summary of the past history medically, past history surgically, known current medicines, social history and family history.  This information is gathered electronically by the computer from prior information and documentation.  I review this each visit and have found including this information at this point in the chart is beneficial and informative.    History reviewed. No pertinent past medical history.  Past Surgical History:  Procedure Laterality Date  . APPENDECTOMY  age 63    Family History  Problem Relation Age of Onset  . Diabetes Mother   . Kidney disease Mother   . Hypertension Mother   . Heart failure Mother     Social History Social History   Tobacco Use  . Smoking status: Former Smoker    Packs/day: 0.25    Years: 31.00    Pack years: 7.75    Types: Cigarettes    Quit date: 04/29/2020    Years since quitting: 0.6  . Smokeless tobacco: Never Used  . Tobacco comment: 1-2 cig months  Vaping Use  . Vaping Use: Never used  Substance Use Topics  . Alcohol use: Yes    Comment: occasionally  . Drug use: Not Currently    No Known Allergies  Current Outpatient Medications  Medication Sig Dispense Refill  . amLODipine (NORVASC) 5 MG tablet Take 1 tablet (5 mg total) by mouth daily. 30 tablet 1  . diphenhydrAMINE (BENADRYL) 25 MG tablet Take 25 mg by mouth every 6  (six) hours as needed.    Marland Kitchen ibuprofen (ADVIL) 200 MG tablet Take 200 mg by mouth every 6 (six) hours as needed.    Marland Kitchen losartan (COZAAR) 100 MG tablet Take 1 tablet (100 mg total) by mouth daily. 90 tablet 1  . Mometasone Furo-Formoterol Fum (DULERA IN) Inhale 2 puffs into the lungs as needed.    . naproxen (NAPROSYN) 500 MG tablet Take 1 tablet (500 mg total) by mouth 2 (two) times daily with a meal. 60 tablet 5  . PROVENTIL HFA 108 (90 Base) MCG/ACT inhaler INHALE 2 PUFFS BY MOUTH EVERY 6 HOURS AS NEEDED FOR COUGHING, WHEEZING, OR SHORTNESS OF BREATH 6.7 g 1  . tiZANidine (ZANAFLEX) 4 MG tablet One by mouth every night before bed as needed for spasm 30 tablet 1  . WIXELA INHUB 100-50 MCG/DOSE AEPB INHALE 1 PUFF BY MOUTH TWICE DAILY (EVERY 12 HOURS). RINSE MOUTH AFTER USE. - USE IN THE MORNING AND AT BEDTIME 60 each 1  . HYDROcodone-acetaminophen (NORCO/VICODIN) 5-325 MG tablet One tablet every six hours for pain.  Limit 7 days. 28 tablet 0   No current facility-administered medications for this visit.     Physical Exam  Blood pressure (!) 206/120, pulse 81, height 5\' 6"  (1.676 m), weight 206 lb (93.4 kg), last menstrual period 04/28/2015.  Constitutional: overall normal hygiene, normal nutrition, well developed, normal  grooming, normal body habitus. Assistive device:none  Musculoskeletal: gait and station Limp none, muscle tone and strength are normal, no tremors or atrophy is present.  .  Neurological: coordination overall normal.  Deep tendon reflex/nerve stretch intact.  Sensation normal.  Cranial nerves II-XII intact.   Neck has full ROM.  NV intact.  Skin:   Normal overall no scars, lesions, ulcers or rashes. No psoriasis.  Psychiatric: Alert and oriented x 3.  Recent memory intact, remote memory unclear.  Normal mood and affect. Well groomed.  Good eye contact.  Cardiovascular: overall no swelling, no varicosities, no edema bilaterally, normal temperatures of the legs and arms,  no clubbing, cyanosis and good capillary refill.  Lymphatic: palpation is normal.  All other systems reviewed and are negative   The patient has been educated about the nature of the problem(s) and counseled on treatment options.  The patient appeared to understand what I have discussed and is in agreement with it.  Encounter Diagnoses  Name Primary?  . Neck pain Yes  . Nicotine dependence, cigarettes, uncomplicated     PLAN Call if any problems.  Precautions discussed.  Continue current medications.   Return to clinic 1 month   I have reviewed the [facility] Controlled Substance Reporting System web site prior to prescribing narcotic medicine for this patient.   Electronically Signed Darreld Mclean, MD 1/11/202210:37 AM

## 2021-01-19 ENCOUNTER — Other Ambulatory Visit: Payer: Self-pay | Admitting: Physician Assistant

## 2021-01-31 ENCOUNTER — Encounter: Payer: Self-pay | Admitting: Physician Assistant

## 2021-01-31 ENCOUNTER — Ambulatory Visit: Payer: Medicaid Other | Admitting: Physician Assistant

## 2021-01-31 ENCOUNTER — Telehealth: Payer: Self-pay | Admitting: Orthopaedic Surgery

## 2021-01-31 DIAGNOSIS — R059 Cough, unspecified: Secondary | ICD-10-CM

## 2021-01-31 DIAGNOSIS — I10 Essential (primary) hypertension: Secondary | ICD-10-CM

## 2021-01-31 DIAGNOSIS — Z20822 Contact with and (suspected) exposure to covid-19: Secondary | ICD-10-CM

## 2021-01-31 DIAGNOSIS — J449 Chronic obstructive pulmonary disease, unspecified: Secondary | ICD-10-CM

## 2021-01-31 MED ORDER — HYDROCODONE-ACETAMINOPHEN 5-325 MG PO TABS
ORAL_TABLET | ORAL | 0 refills | Status: DC
Start: 2021-01-31 — End: 2021-02-20

## 2021-01-31 MED ORDER — AMLODIPINE BESYLATE 10 MG PO TABS: 10.0000 mg | ORAL_TABLET | Freq: Every day | ORAL | 1 refills | Status: DC

## 2021-01-31 MED ORDER — LOSARTAN POTASSIUM 100 MG PO TABS
100.0000 mg | ORAL_TABLET | Freq: Every day | ORAL | 1 refills | Status: DC
Start: 2021-01-31 — End: 2021-04-24

## 2021-01-31 NOTE — Progress Notes (Signed)
LMP 04/28/2015    Subjective:    Patient ID: Apolinar Junes A. Oshel, female    DOB: Dec 18, 1966, 55 y.o.   MRN: [id number]  HPI: Allyse A. Manton is a 55 y.o. female presenting on 01/31/2021 for No chief complaint on file.   HPI     Pt was scheduled for in-office in-person appointment today but it was changed to a virtual appointment due to cough and congestion.   I connected with  Abygayle A. Neary on 01/31/21 by a video enabled telemedicine application and verified that I am speaking with the correct person using two identifiers.   I discussed the limitations of evaluation and management by telemedicine. The patient expressed understanding and agreed to proceed.     Pt is sick.  She is Congested and coughing  - pt says it only happens at night but screener observed cough x 3 during that short amount of time.  She checks her bp at home occasionally.  Sunday 140/106 Pt thinks her coughing is due to being around wood fireplace a few days ago.  She has not yet gotten covid booster.  She didn't get her labs drawn.     Relevant past medical, surgical, family and social history reviewed and updated as indicated. Interim medical history since our last visit reviewed. Allergies and medications reviewed and updated.   Current Outpatient Medications:  .  amLODipine (NORVASC) 5 MG tablet, Take 1 tablet (5 mg total) by mouth daily., Disp: 30 tablet, Rfl: 1 .  diphenhydrAMINE (BENADRYL) 25 MG tablet, Take 25 mg by mouth every 6 (six) hours as needed., Disp: , Rfl:  .  Fluticasone-Salmeterol (ADVAIR) 100-50 MCG/DOSE AEPB, INHALE 1 PUFF BY MOUTH TWICE DAILY (EVERY 12 HOURS). RINSE MOUTH AFTER USE. - USE IN THE MORNING AND AT BEDTIME, Disp: 120 each, Rfl: 1 .  HYDROcodone-acetaminophen (NORCO/VICODIN) 5-325 MG tablet, One tablet every four hours for pain., Disp: 28 tablet, Rfl: 0 .  ibuprofen (ADVIL) 200 MG tablet, Take 200 mg by mouth every 6 (six) hours as needed., Disp: , Rfl:  .   losartan (COZAAR) 100 MG tablet, Take 1 tablet (100 mg total) by mouth daily., Disp: 90 tablet, Rfl: 1 .  naproxen (NAPROSYN) 500 MG tablet, Take 1 tablet (500 mg total) by mouth 2 (two) times daily with a meal., Disp: 60 tablet, Rfl: 5 .  PROVENTIL HFA 108 (90 Base) MCG/ACT inhaler, INHALE 2 PUFFS BY MOUTH EVERY 6 HOURS AS NEEDED FOR COUGHING, WHEEZING, OR SHORTNESS OF BREATH, Disp: 13.4 g, Rfl: 1 .  tiZANidine (ZANAFLEX) 4 MG tablet, One by mouth every night before bed as needed for spasm, Disp: 30 tablet, Rfl: 1     Review of Systems  Per HPI unless specifically indicated above     Objective:    LMP 04/28/2015   Wt Readings from Last 3 Encounters:  01/09/21 206 lb (93.4 kg)  01/03/21 206 lb 8 oz (93.7 kg)  12/12/20 195 lb (88.5 kg)    Physical Exam Constitutional:      General: She is not in acute distress. HENT:     Head: Normocephalic and atraumatic.  Neurological:     Mental Status: She is alert and oriented to person, place, and time.  Psychiatric:        Attention and Perception: Attention normal.        Speech: Speech normal.        Behavior: Behavior is cooperative.  Assessment & Plan:    Encounter Diagnoses  Name Primary?  . Person under investigation for COVID-19 Yes  . Cough   . Essential hypertension   . Chronic obstructive pulmonary disease, unspecified COPD type (HCC)      -discussed options for covid testing and she prefers to go to [facility] covid test site -Counseled on self-isolation until she gets her results -Send rx to [facility] -pt to follow up 1 month recheck bp.  She will need labs before that appt

## 2021-01-31 NOTE — Telephone Encounter (Signed)
Patient request refill on medicine   HYDROcodone-Acetaminophen (Tab) NORCO/VICODIN 5-325 MG     Pharmacy:  [facility] on [address]

## 2021-02-01 ENCOUNTER — Telehealth: Payer: Self-pay | Admitting: Student

## 2021-02-01 NOTE — Telephone Encounter (Signed)
Pt called and notified nurse that she got tested at [facility] yesterday, 01-31-21 and got her result today, 02-01-21 and tested NEGATIVE. Pt states her son who lives in the same household tested POSITIVE.  Considering pt is symptomatic and lives with her [place] son. Pt was advised to maintain quarantine.  " remain in self-quarantine until they meet the "Non-Test Criteria for Ending Self-Isolation". Non-Test Criteria for Ending Self-Isolation All persons with fever and respiratory symptoms should isolate themselves until ALL conditions listed below are met: " at least 5 days since symptoms onset " AND 3 consecutive days fever free without antipyretics (acetaminophen [Tylenol] or ibuprofen [Advil]) " AND improvement in respiratory symptoms " If the patient develops respiratory issues/distress, seek medical care in the Emergency Department, call 911, reports symptoms and report COVID-19 positive test. Patient Instructions " continue to utilize over-the-counter medications for fever (ibuprofen and/or Tylenol) and cough (cough medicine and/or sore throat lozenges). " wear a mask around people and follow good infection prevention techniques. " Patient to should only leave home to seek medical care. " send family for food, prescriptions or medicines; or use delivery service.  " If the patient must leave the home, they MUST wear a mask in public. " limit contact with immediate family members or caregivers in the home, and use mask, social distancing, and handwashing to decrease risk to patients. o Please continue good preventive care measures, including frequent hand washing, avoid touching your face, cover coughs/sneezes with tissue or into elbow, stay out of crowds and keep a 6-foot distance from others.   " patient and family to clean hard surfaces touched by patient frequently with household cleaning products. Pt verbalized understanding.

## 2021-02-06 ENCOUNTER — Ambulatory Visit: Payer: Self-pay | Admitting: Orthopaedic Surgery

## 2021-02-14 ENCOUNTER — Other Ambulatory Visit: Payer: Self-pay | Admitting: Physician Assistant

## 2021-02-20 ENCOUNTER — Encounter: Payer: Self-pay | Admitting: Orthopaedic Surgery

## 2021-02-20 ENCOUNTER — Other Ambulatory Visit: Payer: Self-pay

## 2021-02-20 ENCOUNTER — Ambulatory Visit: Payer: Self-pay | Admitting: Orthopaedic Surgery

## 2021-02-20 ENCOUNTER — Ambulatory Visit: Payer: Medicaid Other

## 2021-02-20 VITALS — BP 146/89 | HR 111 | Ht 66.0 in | Wt 200.0 lb

## 2021-02-20 DIAGNOSIS — M545 Low back pain, unspecified: Secondary | ICD-10-CM

## 2021-02-20 DIAGNOSIS — M542 Cervicalgia: Secondary | ICD-10-CM

## 2021-02-20 DIAGNOSIS — F17210 Nicotine dependence, cigarettes, uncomplicated: Secondary | ICD-10-CM

## 2021-02-20 MED ORDER — HYDROCODONE-ACETAMINOPHEN 5-325 MG PO TABS
ORAL_TABLET | ORAL | 0 refills | Status: DC
Start: 2021-02-20 — End: 2021-03-14

## 2021-02-20 NOTE — Progress Notes (Signed)
Patient WH:[id number] Judy Leonard, female DOB:1966-02-24, 55 y.o. GYK:[id number]  Chief Complaint  Patient presents with  . Neck Pain    Patient reports neck and going down to lower back. Patient reports hard to get comfortable and she is losing sleep,     HPI  Judy Leonard is a 55 y.o. female who has neck pain and now lower back pain with no sciatica.  She has numbness at times of the left hand dorsally over the long finger.  She has no trauma, no weakness.   Body mass index is 32.28 kg/m.  ROS  Review of Systems  Constitutional: Positive for activity change.  Musculoskeletal: Positive for arthralgias, myalgias and neck pain.  All other systems reviewed and are negative.   All other systems reviewed and are negative.  The following is a summary of the past history medically, past history surgically, known current medicines, social history and family history.  This information is gathered electronically by the computer from prior information and documentation.  I review this each visit and have found including this information at this point in the chart is beneficial and informative.    No past medical history on file.  Past Surgical History:  Procedure Laterality Date  . APPENDECTOMY  age 67    Family History  Problem Relation Age of Onset  . Diabetes Mother   . Kidney disease Mother   . Hypertension Mother   . Heart failure Mother     Social History Social History   Tobacco Use  . Smoking status: Former Smoker    Packs/day: 0.25    Years: 31.00    Pack years: 7.75    Types: Cigarettes    Quit date: 04/29/2020    Years since quitting: 0.8  . Smokeless tobacco: Never Used  . Tobacco comment: 1-2 cig months  Vaping Use  . Vaping Use: Never used  Substance Use Topics  . Alcohol use: Yes    Comment: occasionally  . Drug use: Not Currently    No Known Allergies  Current Outpatient Medications  Medication Sig Dispense Refill  . amLODipine (NORVASC) 10 MG  tablet Take 1 tablet (10 mg total) by mouth daily. 90 tablet 1  . diphenhydrAMINE (BENADRYL) 25 MG tablet Take 25 mg by mouth every 6 (six) hours as needed.    . Fluticasone-Salmeterol (ADVAIR) 100-50 MCG/DOSE AEPB INHALE 1 PUFF BY MOUTH TWICE DAILY (EVERY 12 HOURS). RINSE MOUTH AFTER USE. - USE IN THE MORNING AND AT BEDTIME 120 each 1  . HYDROcodone-acetaminophen (NORCO/VICODIN) 5-325 MG tablet One tablet every four hours for pain. 28 tablet 0  . ibuprofen (ADVIL) 200 MG tablet Take 200 mg by mouth every 6 (six) hours as needed.    Marland Kitchen losartan (COZAAR) 100 MG tablet Take 1 tablet (100 mg total) by mouth daily. 90 tablet 1  . naproxen (NAPROSYN) 500 MG tablet Take 1 tablet (500 mg total) by mouth 2 (two) times daily with a meal. 60 tablet 5  . PROVENTIL HFA 108 (90 Base) MCG/ACT inhaler INHALE 2 PUFFS BY MOUTH EVERY 6 HOURS AS NEEDED FOR COUGHING, WHEEZING, OR SHORTNESS OF BREATH 13.4 g 1  . tiZANidine (ZANAFLEX) 4 MG tablet One by mouth every night before bed as needed for spasm 30 tablet 1   No current facility-administered medications for this visit.     Physical Exam  Blood pressure (!) 146/89, pulse (!) 111, height 5\' 6"  (1.676 m), weight 200 lb (90.7 kg), last menstrual period 04/28/2015.  Constitutional: overall normal hygiene, normal nutrition, well developed, normal grooming, normal body habitus. Assistive device:none  Musculoskeletal: gait and station Limp none, muscle tone and strength are normal, no tremors or atrophy is present.  .  Neurological: coordination overall normal.  Deep tendon reflex/nerve stretch intact.  Sensation normal.  Cranial nerves II-XII intact.   Skin:   Normal overall no scars, lesions, ulcers or rashes. No psoriasis.  Psychiatric: Alert and oriented x 3.  Recent memory intact, remote memory unclear.  Normal mood and affect. Well groomed.  Good eye contact.  Cardiovascular: overall no swelling, no varicosities, no edema bilaterally, normal temperatures  of the legs and arms, no clubbing, cyanosis and good capillary refill.  Spine/Pelvis examination:  Inspection:  Overall, sacoiliac joint benign and hips nontender; without crepitus or defects.   Thoracic spine inspection: Alignment normal without kyphosis present   Lumbar spine inspection:  Alignment  with normal lumbar lordosis, without scoliosis apparent.   Thoracic spine palpation:  without tenderness of spinal processes   Lumbar spine palpation: without tenderness of lumbar area; without tightness of lumbar muscles    Range of Motion:   Lumbar flexion, forward flexion is normal without pain or tenderness    Lumbar extension is full without pain or tenderness   Left lateral bend is normal without pain or tenderness   Right lateral bend is normal without pain or tenderness   Straight leg raising is normal  Strength & tone: normal   Stability overall normal stability  Lymphatic: palpation is normal.  All other systems reviewed and are negative   The patient has been educated about the nature of the problem(s) and counseled on treatment options.  The patient appeared to understand what I have discussed and is in agreement with it.  Encounter Diagnoses  Name Primary?  . Acute midline low back pain without sciatica Yes  . Neck pain   . Nicotine dependence, cigarettes, uncomplicated   X-rays were done of the lumbar spine, reported separately.  I will get EMG of upper extremities.  PLAN Call if any problems.  Precautions discussed.  Continue current medications.   Return to clinic 2 weeks   I have reviewed the [facility] Controlled Substance Reporting System web site prior to prescribing narcotic medicine for this patient.   Electronically Signed Darreld Mclean, MD 2/22/20223:35 PM

## 2021-02-21 ENCOUNTER — Other Ambulatory Visit: Payer: Self-pay | Admitting: Orthopaedic Surgery

## 2021-02-21 DIAGNOSIS — R200 Anesthesia of skin: Secondary | ICD-10-CM

## 2021-02-26 ENCOUNTER — Other Ambulatory Visit (HOSPITAL_COMMUNITY)
Admission: RE | Admit: 2021-02-26 | Discharge: 2021-02-26 | Disposition: A | Discharge status: Home / Self Care | Payer: Medicaid Other | Source: Ambulatory Visit | Attending: Physician Assistant | Admitting: Physician Assistant

## 2021-02-26 ENCOUNTER — Other Ambulatory Visit: Payer: Self-pay

## 2021-02-26 DIAGNOSIS — I10 Essential (primary) hypertension: Secondary | ICD-10-CM

## 2021-02-26 LAB — BASIC METABOLIC PANEL
Anion gap: 11 (ref 5–15)
BUN: 9 mg/dL (ref 6–20)
CO2: 28 mmol/L (ref 22–32)
Calcium: 9.2 mg/dL (ref 8.9–10.3)
Chloride: 103 mmol/L (ref 98–111)
Creatinine, Ser: 0.98 mg/dL (ref 0.44–1.00)
GFR, Estimated: >60 mL/min (ref >60)
Glucose, Bld: 95 mg/dL (ref 70–99)
Potassium: 3.2 mmol/L — ABNORMAL LOW (ref 3.5–5.1)
Sodium: 142 mmol/L (ref 135–145)

## 2021-02-28 ENCOUNTER — Ambulatory Visit: Payer: Medicaid Other | Admitting: Physician Assistant

## 2021-02-28 ENCOUNTER — Other Ambulatory Visit: Payer: Self-pay

## 2021-02-28 ENCOUNTER — Encounter: Payer: Self-pay | Admitting: Physician Assistant

## 2021-02-28 VITALS — BP 129/60 | HR 86 | Temp 97.7°F | Wt 200.0 lb

## 2021-02-28 DIAGNOSIS — I10 Essential (primary) hypertension: Secondary | ICD-10-CM

## 2021-02-28 DIAGNOSIS — Z1211 Encounter for screening for malignant neoplasm of colon: Secondary | ICD-10-CM

## 2021-02-28 DIAGNOSIS — E876 Hypokalemia: Secondary | ICD-10-CM

## 2021-02-28 DIAGNOSIS — J449 Chronic obstructive pulmonary disease, unspecified: Secondary | ICD-10-CM

## 2021-02-28 MED ORDER — POTASSIUM CHLORIDE ER 10 MEQ PO TBCR: 10.0000 meq | EXTENDED_RELEASE_TABLET | Freq: Every day | ORAL | 0 refills | Status: DC

## 2021-02-28 NOTE — Progress Notes (Signed)
BP 129/60   Pulse 86   Temp 97.7 F (36.5 C)   Wt 200 lb (90.7 kg)   LMP 04/28/2015   SpO2 95%   BMI 32.28 kg/m    Subjective:    Patient ID: Judy Leonard, female    DOB: Feb 09, 1966, 56 y.o.   MRN: [id number]  HPI: Judy Leonard is a 55 y.o. female presenting on 02/28/2021 for Hypertension   HPI    Pt had a negative covid 19 screening questionnaire.   Chief Complaint  Patient presents with  . Hypertension     Pt Got covid vaccination shots but not yet boosted  She says she is Doing good.  She denies cp, sob.  She says she is scheduled for EMG ordered by orthopedics.     Relevant past medical, surgical, family and social history reviewed and updated as indicated. Interim medical history since our last visit reviewed. Allergies and medications reviewed and updated.   Current Outpatient Medications:  .  amLODipine (NORVASC) 10 MG tablet, Take 1 tablet (10 mg total) by mouth daily., Disp: 90 tablet, Rfl: 1 .  benzonatate (TESSALON) 100 MG capsule, Take by mouth 3 (three) times daily as needed for cough., Disp: , Rfl:  .  diphenhydrAMINE (BENADRYL) 25 MG tablet, Take 25 mg by mouth every 6 (six) hours as needed., Disp: , Rfl:  .  Fluticasone-Salmeterol (ADVAIR) 100-50 MCG/DOSE AEPB, INHALE 1 PUFF BY MOUTH TWICE DAILY (EVERY 12 HOURS). RINSE MOUTH AFTER USE. - USE IN THE MORNING AND AT BEDTIME, Disp: 120 each, Rfl: 1 .  HYDROcodone-acetaminophen (NORCO/VICODIN) 5-325 MG tablet, One tablet every four hours for pain., Disp: 28 tablet, Rfl: 0 .  losartan (COZAAR) 100 MG tablet, Take 1 tablet (100 mg total) by mouth daily., Disp: 90 tablet, Rfl: 1 .  naproxen (NAPROSYN) 500 MG tablet, Take 1 tablet (500 mg total) by mouth 2 (two) times daily with a meal., Disp: 60 tablet, Rfl: 5 .  PROVENTIL HFA 108 (90 Base) MCG/ACT inhaler, INHALE 2 PUFFS BY MOUTH EVERY 6 HOURS AS NEEDED FOR COUGHING, WHEEZING, OR SHORTNESS OF BREATH, Disp: 13.4 g, Rfl: 1 .  tiZANidine  (ZANAFLEX) 4 MG tablet, One by mouth every night before bed as needed for spasm, Disp: 30 tablet, Rfl: 1    Review of Systems  Per HPI unless specifically indicated above     Objective:    BP 129/60   Pulse 86   Temp 97.7 F (36.5 C)   Wt 200 lb (90.7 kg)   LMP 04/28/2015   SpO2 95%   BMI 32.28 kg/m   Wt Readings from Last 3 Encounters:  02/28/21 200 lb (90.7 kg)  02/20/21 200 lb (90.7 kg)  01/09/21 206 lb (93.4 kg)    Physical Exam Vitals reviewed.  Constitutional:      General: She is not in acute distress.    Appearance: She is well-developed and well-nourished. She is not ill-appearing.  HENT:     Head: Normocephalic and atraumatic.  Cardiovascular:     Rate and Rhythm: Normal rate and regular rhythm.  Pulmonary:     Effort: Pulmonary effort is normal.     Breath sounds: Normal breath sounds.  Abdominal:     General: Bowel sounds are normal.     Palpations: Abdomen is soft. There is no hepatosplenomegaly or mass.     Tenderness: There is no abdominal tenderness.  Musculoskeletal:        General: No edema.  Cervical back: Neck supple.     Right lower leg: No edema.     Left lower leg: No edema.  Lymphadenopathy:     Cervical: No cervical adenopathy.  Skin:    General: Skin is warm and dry.  Neurological:     Mental Status: She is alert and oriented to person, place, and time.  Psychiatric:        Mood and Affect: Mood and affect normal.        Behavior: Behavior normal.     Results for orders placed or performed during the hospital encounter of 02/26/21  Basic metabolic panel  Result Value Ref Range   Sodium 142 135 - 145 mmol/L   Potassium 3.2 (L) 3.5 - 5.1 mmol/L   Chloride 103 98 - 111 mmol/L   CO2 28 22 - 32 mmol/L   Glucose, Bld 95 70 - 99 mg/dL   BUN 9 6 - 20 mg/dL   Creatinine, Ser 1.88 0.44 - 1.00 mg/dL   Calcium 9.2 8.9 - 41.6 mg/dL   GFR, Estimated >60 >63 mL/min   Anion gap 11 5 - 15      Assessment & Plan:   Encounter  Diagnoses  Name Primary?  . Essential hypertension Yes  . Hypokalemia   . Chronic obstructive pulmonary disease, unspecified COPD type (HCC)   . Screening for colon cancer       -reviewed labs with pt -pt was given ifobt for colon cancer screening  -rx K+ 10 meq x 14 d -pt to follow up 2 months.  She is to contact office sooner prn

## 2021-03-01 ENCOUNTER — Other Ambulatory Visit: Payer: Self-pay | Admitting: Physician Assistant

## 2021-03-01 DIAGNOSIS — Z1211 Encounter for screening for malignant neoplasm of colon: Secondary | ICD-10-CM

## 2021-03-01 DIAGNOSIS — J449 Chronic obstructive pulmonary disease, unspecified: Secondary | ICD-10-CM | POA: Insufficient documentation

## 2021-03-06 ENCOUNTER — Ambulatory Visit: Payer: Medicaid Other | Admitting: Orthopaedic Surgery

## 2021-03-14 ENCOUNTER — Telehealth: Payer: Self-pay | Admitting: Orthopaedic Surgery

## 2021-03-14 MED ORDER — HYDROCODONE-ACETAMINOPHEN 5-325 MG PO TABS
ORAL_TABLET | ORAL | 0 refills | Status: DC
Start: 2021-03-14 — End: 2021-04-18

## 2021-03-14 NOTE — Telephone Encounter (Signed)
Patient called for refill: (patient aware of appointment 04/24/21, due to needing to have nerve conduction test done (scheduled 04/20/21 with Dr Alvester Morin) HYDROcodone-acetaminophen (NORCO/VICODIN) 5-325 MG tablet 28 tablet  [facility], [address] [place]

## 2021-03-15 LAB — IFOBT (OCCULT BLOOD): IFOBT: NEGATIVE

## 2021-03-19 ENCOUNTER — Telehealth: Payer: Self-pay

## 2021-03-19 NOTE — Telephone Encounter (Signed)
Called pt to inform colon cancer screening test normal.  MG

## 2021-03-28 ENCOUNTER — Telehealth: Payer: Self-pay

## 2021-03-28 NOTE — Telephone Encounter (Signed)
Called patient and LMOM we are not accepting this patient per MG

## 2021-04-18 ENCOUNTER — Telehealth: Payer: Self-pay

## 2021-04-18 MED ORDER — HYDROCODONE-ACETAMINOPHEN 5-325 MG PO TABS
ORAL_TABLET | ORAL | 0 refills | Status: DC
Start: 2021-04-18 — End: 2021-05-18

## 2021-04-18 NOTE — Telephone Encounter (Signed)
Hydrocodone-Acetaminophen  5/325 mg  Qty 28 Tablets ° °PATIENT USES [facility] ON [address] °

## 2021-04-20 ENCOUNTER — Encounter: Payer: Self-pay | Admitting: Physical Medicine and Rehabilitation

## 2021-04-20 ENCOUNTER — Other Ambulatory Visit: Payer: Self-pay

## 2021-04-20 ENCOUNTER — Ambulatory Visit (INDEPENDENT_AMBULATORY_CARE_PROVIDER_SITE_OTHER): Payer: Medicare Other | Admitting: Physical Medicine and Rehabilitation

## 2021-04-20 DIAGNOSIS — R202 Paresthesia of skin: Secondary | ICD-10-CM | POA: Diagnosis not present

## 2021-04-20 NOTE — Progress Notes (Signed)
Pt state neck pain that travels to her mid back and left shoulder. Pt sitting for a long time, cleaning her house or sleep makes the pain worse. Pt state she take pain meds for the pain. Pt state she right handed.  Numeric Pain Rating Scale and Functional Assessment Average Pain 6   In the last MONTH (on 0-10 scale) has pain interfered with the following?  1. General activity like being  able to carry out your everyday physical activities such as walking, climbing stairs, carrying groceries, or moving a chair?  Rating(9)

## 2021-04-23 NOTE — Procedures (Signed)
EMG & NCV Findings: Evaluation of the left median (across palm) sensory nerve showed prolonged distal peak latency (Wrist, 3.8 ms) and prolonged distal peak latency (Palm, 2.2 ms).  All remaining nerves (as indicated in the following tables) were within normal limits.    All examined muscles (as indicated in the following table) showed no evidence of electrical instability.    Impression: The above electrodiagnostic study is ABNORMAL and reveals evidence of a mild left median nerve entrapment at the wrist (carpal tunnel syndrome) affecting sensory components.   There is no significant electrodiagnostic evidence of any other focal nerve entrapment, brachial plexopathy or cervical radiculopathy.  As you know, this particular electrodiagnostic study cannot rule out chemical radiculitis or sensory only radiculopathy. **This electrodiagnostic study cannot rule out small fiber polyneuropathy and dysesthesias from central pain syndromes such as stroke or central pain sensitization syndromes such as fibromyalgia.  Myotomal referral pain from trigger points is also not excluded.  Recommendations: 1.  Follow-up with referring physician. 2.  Continue current management of symptoms.  ___________________________ Naaman Plummer FAAPMR Board Certified, American Board of Physical Medicine and Rehabilitation    Nerve Conduction Studies Anti Sensory Summary Table   Stim Site NR Peak (ms) Norm Peak (ms) P-T Amp (V) Norm P-T Amp Site1 Site2 Delta-P (ms) Dist (cm) Vel (m/s) Norm Vel (m/s)  Left Median Acr Palm Anti Sensory (2nd Digit)  30.2C  Wrist    *3.8 <3.6 35.1 >10 Wrist Palm 1.6 0.0    Palm    *2.2 <2.0 1.7         Left Radial Anti Sensory (Base 1st Digit)  30.1C  Wrist    2.2 <3.1 26.3  Wrist Base 1st Digit 2.2 0.0    Left Ulnar Anti Sensory (5th Digit)  30.5C  Wrist    3.2 <3.7 19.8 >15.0 Wrist 5th Digit 3.2 14.0 44 >38   Motor Summary Table   Stim Site NR Onset (ms) Norm Onset (ms) O-P Amp  (mV) Norm O-P Amp Site1 Site2 Delta-0 (ms) Dist (cm) Vel (m/s) Norm Vel (m/s)  Left Median Motor (Abd Poll Brev)  30.1C  Wrist    3.5 <4.2 7.7 >5 Elbow Wrist 4.0 21.0 53 >50  Elbow    7.5  7.5         Left Ulnar Motor (Abd Dig Min)  30.3C  Wrist    2.8 <4.2 9.2 >3 B Elbow Wrist 3.5 21.0 60 >53  B Elbow    6.3  7.8  A Elbow B Elbow 1.8 10.0 56 >53  A Elbow    8.1  7.6          EMG   Side Muscle Nerve Root Ins Act Fibs Psw Amp Dur Poly Recrt Int Dennie Bible Comment  Left 1stDorInt Ulnar C8-T1 Nml Nml Nml Nml Nml 0 Nml Nml   Left Abd Poll Brev Median C8-T1 Nml Nml Nml Nml Nml 0 Nml Nml   Left ExtDigCom   Nml Nml Nml Nml Nml 0 Nml Nml   Left Triceps Radial C6-7-8 Nml Nml Nml Nml Nml 0 Nml Nml   Left Deltoid Axillary C5-6 Nml Nml Nml Nml Nml 0 Nml Nml     Nerve Conduction Studies Anti Sensory Left/Right Comparison   Stim Site L Lat (ms) R Lat (ms) L-R Lat (ms) L Amp (V) R Amp (V) L-R Amp (%) Site1 Site2 L Vel (m/s) R Vel (m/s) L-R Vel (m/s)  Median Acr Palm Anti Sensory (2nd Digit)  30.2C  Wrist *  3.8   35.1   Wrist Palm     Palm *2.2   1.7         Radial Anti Sensory (Base 1st Digit)  30.1C  Wrist 2.2   26.3   Wrist Base 1st Digit     Ulnar Anti Sensory (5th Digit)  30.5C  Wrist 3.2   19.8   Wrist 5th Digit 44     Motor Left/Right Comparison   Stim Site L Lat (ms) R Lat (ms) L-R Lat (ms) L Amp (mV) R Amp (mV) L-R Amp (%) Site1 Site2 L Vel (m/s) R Vel (m/s) L-R Vel (m/s)  Median Motor (Abd Poll Brev)  30.1C  Wrist 3.5   7.7   Elbow Wrist 53    Elbow 7.5   7.5         Ulnar Motor (Abd Dig Min)  30.3C  Wrist 2.8   9.2   B Elbow Wrist 60    B Elbow 6.3   7.8   A Elbow B Elbow 56    A Elbow 8.1   7.6            Waveforms:

## 2021-04-24 ENCOUNTER — Other Ambulatory Visit: Payer: Self-pay

## 2021-04-24 ENCOUNTER — Other Ambulatory Visit: Payer: Self-pay | Admitting: Physician Assistant

## 2021-04-24 ENCOUNTER — Encounter: Payer: Self-pay | Admitting: Orthopaedic Surgery

## 2021-04-24 ENCOUNTER — Ambulatory Visit (INDEPENDENT_AMBULATORY_CARE_PROVIDER_SITE_OTHER): Payer: Medicare Other | Admitting: Orthopaedic Surgery

## 2021-04-24 VITALS — Ht 66.0 in | Wt 203.0 lb

## 2021-04-24 DIAGNOSIS — G5602 Carpal tunnel syndrome, left upper limb: Secondary | ICD-10-CM | POA: Diagnosis not present

## 2021-04-24 IMAGING — MR MR CERVICAL SPINE W/O CM
5 series · 38 of 48 positions shown · non-contrast
Comparison: None.

CLINICAL DATA: Neck pain, chronic.

EXAM:
MRI CERVICAL SPINE WITHOUT CONTRAST
TECHNIQUE: Multiplanar, multisequence MR imaging of the cervical spine was
performed. No intravenous contrast was administered.

[Series 5: T2 · sagittal · 3.0mm · 0.69mm/px · 6 of 15 slices shown (1 of 2)]
[im 1/15]
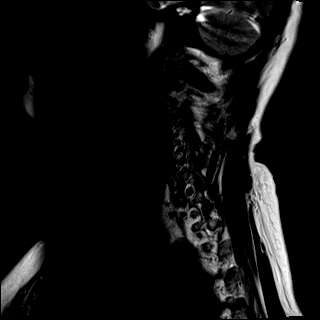
[im 3/15]
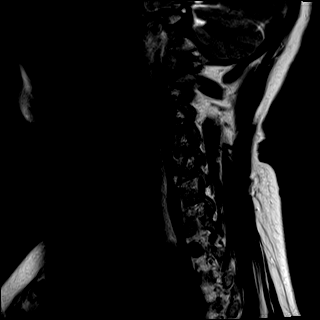
[im 6/15]
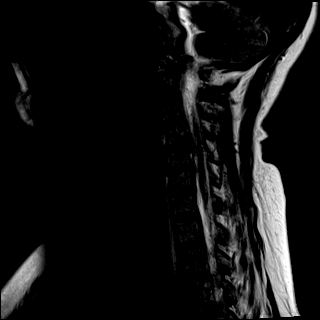
[im 9/15]
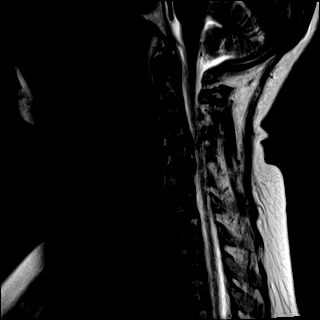
[im 12/15]
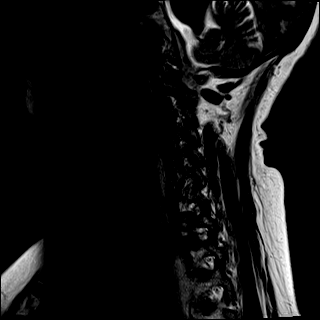
[im 15/15]
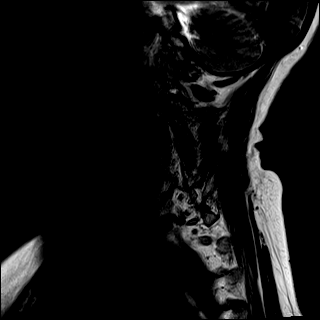

[Series 6: T1 · sagittal · 3.0mm · 0.69mm/px · 6 of 15 slices shown]
[im 1/15]
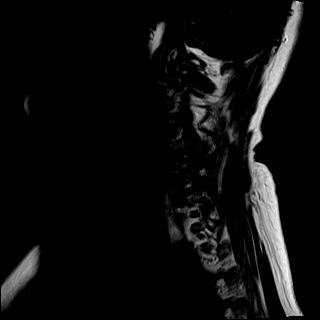
[im 3/15]
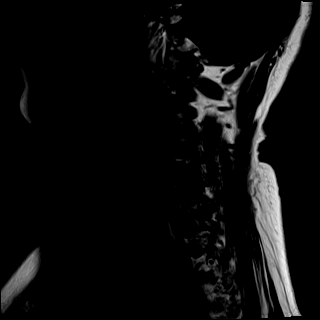
[im 6/15]
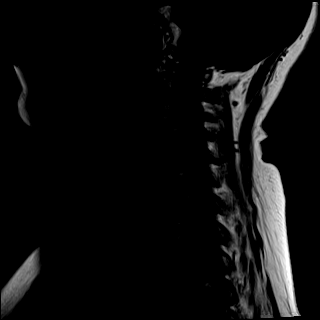
[im 9/15]
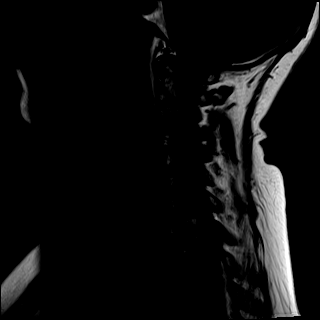
[im 12/15]
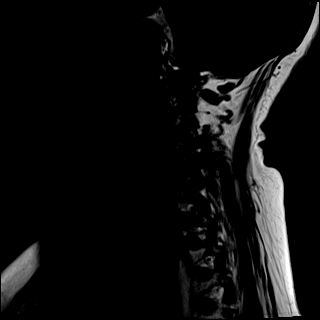
[im 15/15]
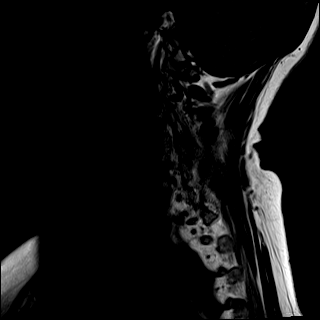

[Series 9: GRE · axial · 3.0mm · 0.39mm/px · z∈[-78,+24]mm · 8 of 35 slices shown]
[im 1/35]
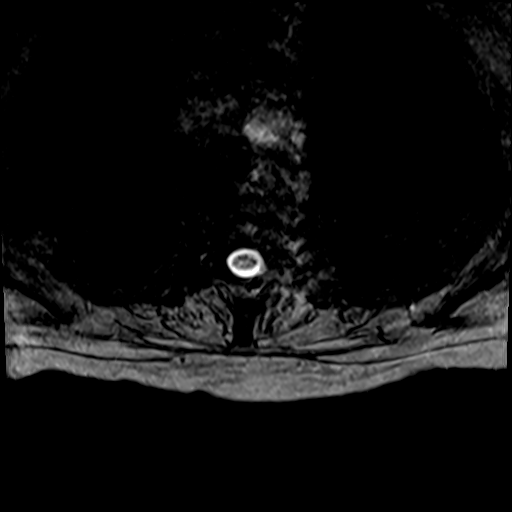
[im 5/35]
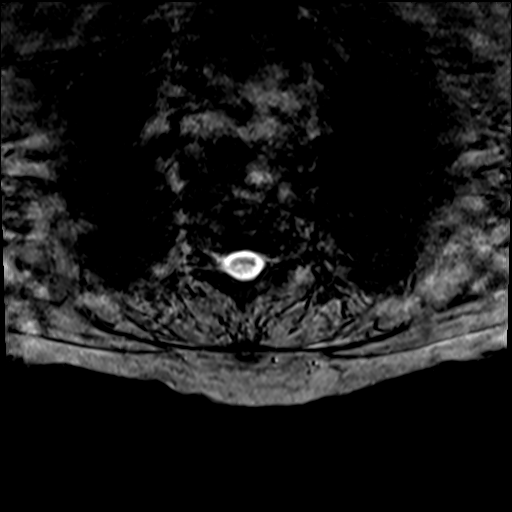
[im 10/35]
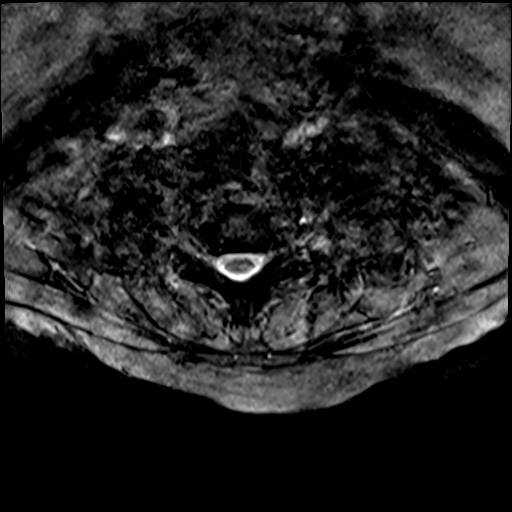
[im 15/35]
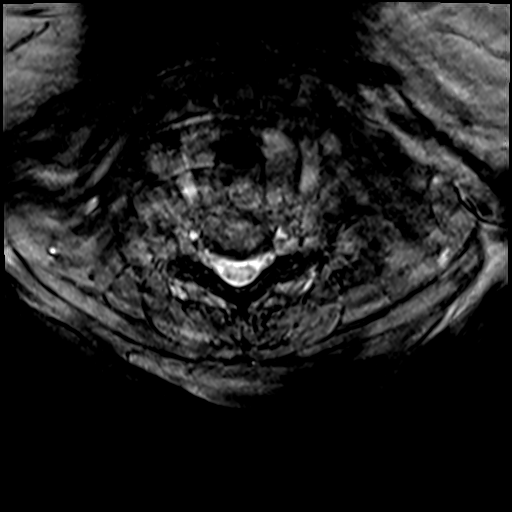
[im 20/35]
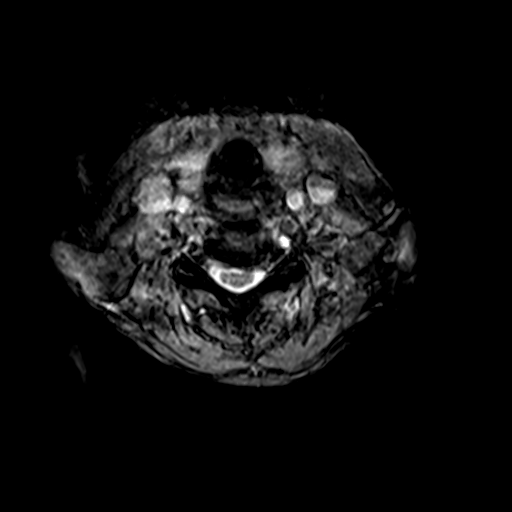
[im 25/35]
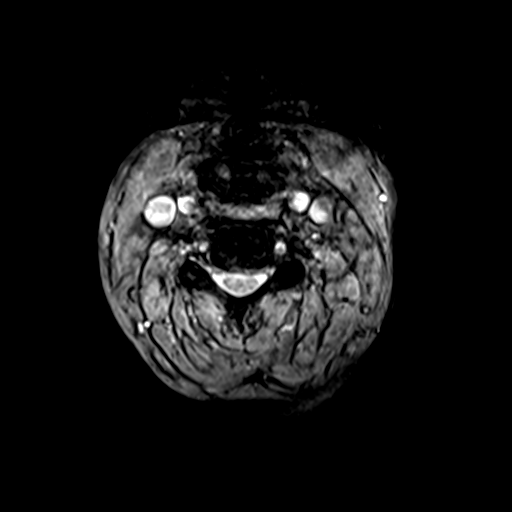
[im 30/35]
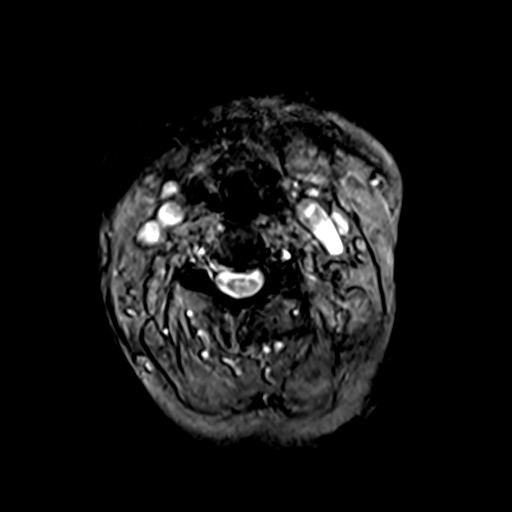
[im 35/35]
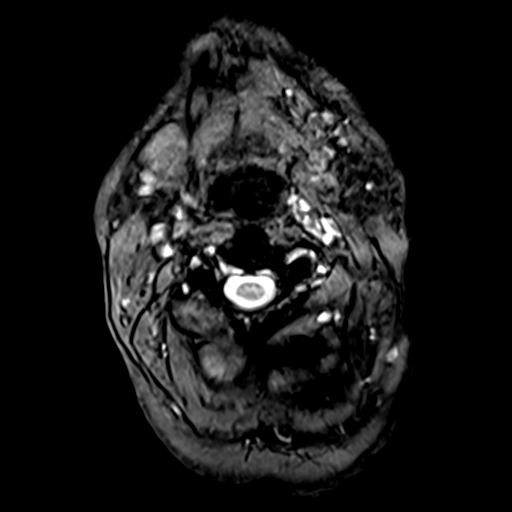

[Series 10: T2 · axial · 3.0mm · 0.78mm/px · z∈[-78,+24]mm · 12 of 35 slices shown (2 of 2)]
[im 1/35]
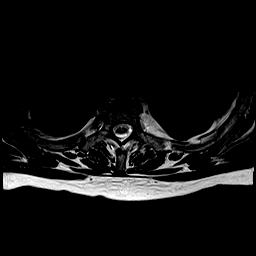
[im 3/35]
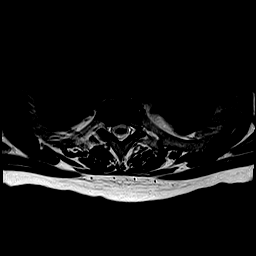
[im 5/35]
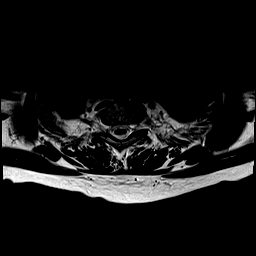
[im 8/35]
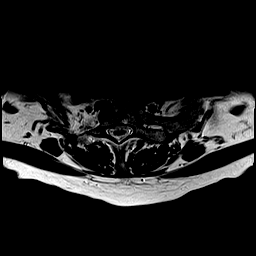
[im 10/35]
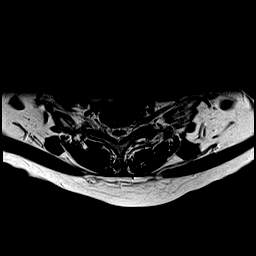
[im 13/35]
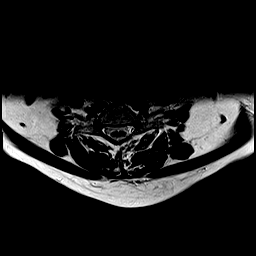
[im 15/35]
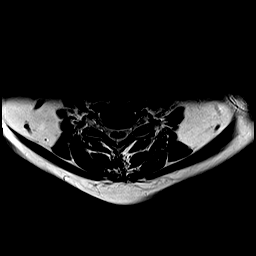
[im 18/35]
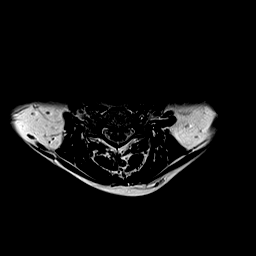
[im 20/35]
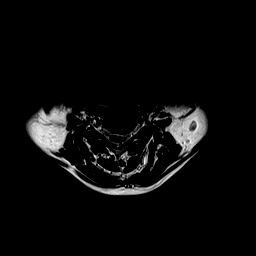
[im 25/35]
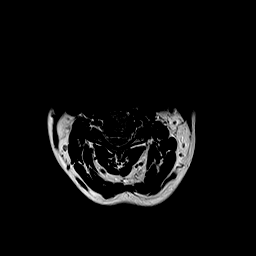
[im 30/35]
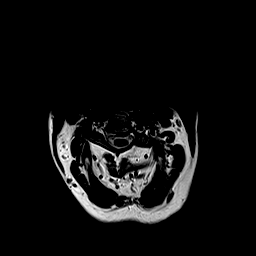
[im 35/35]
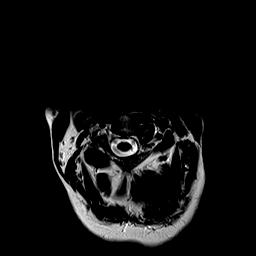

[Series 11: STIR · sagittal · 3.0mm · 0.86mm/px · 6 of 15 slices shown]
[im 1/15]
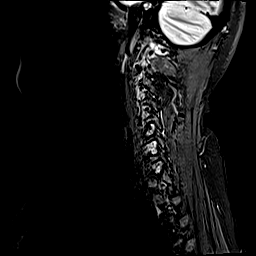
[im 3/15]
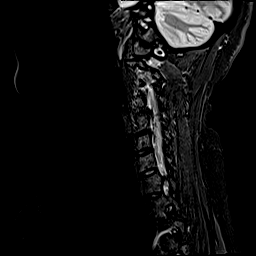
[im 6/15]
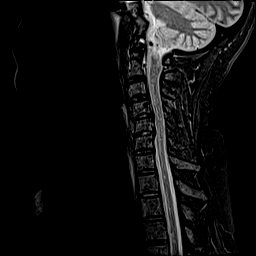
[im 9/15]
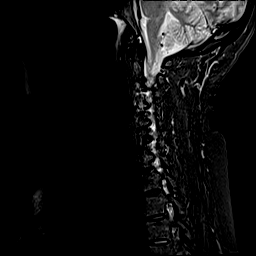
[im 12/15]
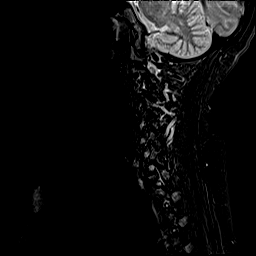
[im 15/15]
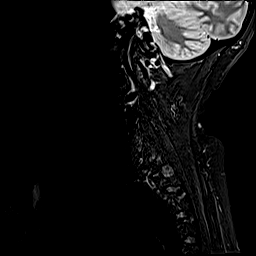

[38 of 48 positions shown; findings below may reference images not displayed]

FINDINGS: The study is degraded by motion.

Alignment: Straightening of the cervical curvature.

Vertebrae: No fracture, evidence of discitis, or bone lesion.

Cord: Flattening of the anterior aspect of the cord at C4-5 and C5-6
without cord signal abnormality.

Posterior Fossa, vertebral arteries, paraspinal tissues: Negative.

Disc levels:

C2-3: No spinal canal stenosis. A left vertebral artery loop within
the left neural foramen (series 3, images 2-5) which may cause
impingement on the exiting C3 nerve root.

C3-4: Small posterior disc protrusion without significant spinal
canal stenosis. Uncovertebral and facet degenerative changes
resulting in mild bilateral neural foraminal narrowing.

C4-5: Posterior disc osteophyte complex causing flattening of the
anterior aspect of the cord and resulting in mild to moderate spinal
canal stenosis. Uncovertebral and facet degenerative changes
resulting in moderate bilateral neural foraminal narrowing.

C5-6: Posterior disc osteophyte complex causing mild flattening of
the anterior aspect of the cord and resulting in mild spinal canal
stenosis. Facet degenerative changes resulting in left neural
foraminal narrowing.

C6-7: Small posterior disc protrusion without significant spinal
canal stenosis. Uncovertebral and facet degenerative changes
resulting in mild left neural foraminal narrowing.

C7-T1: No spinal canal or neural foraminal stenosis.
IMPRESSION: 1. Multilevel degenerative changes of the cervical spine as
described above, with mild-to-moderate spinal canal stenosis at C4-5
and mild spinal canal stenosis at C5-6.
2. Moderate bilateral neural foraminal narrowing at C4-5.
3. Left vertebral artery loop within the left neural foramen at C2-3
which may cause impingement on the exiting C3 nerve root.

## 2021-04-24 IMAGING — DX DG ORBITS FOR FOREIGN BODY
2 series · 2 of 2 positions shown · non-contrast
Comparison: None

CLINICAL DATA: MRI clearance, history of working with metal

EXAM:
ORBITS FOR FOREIGN BODY - 2 VIEW

[orbits lat (1 of 2)]
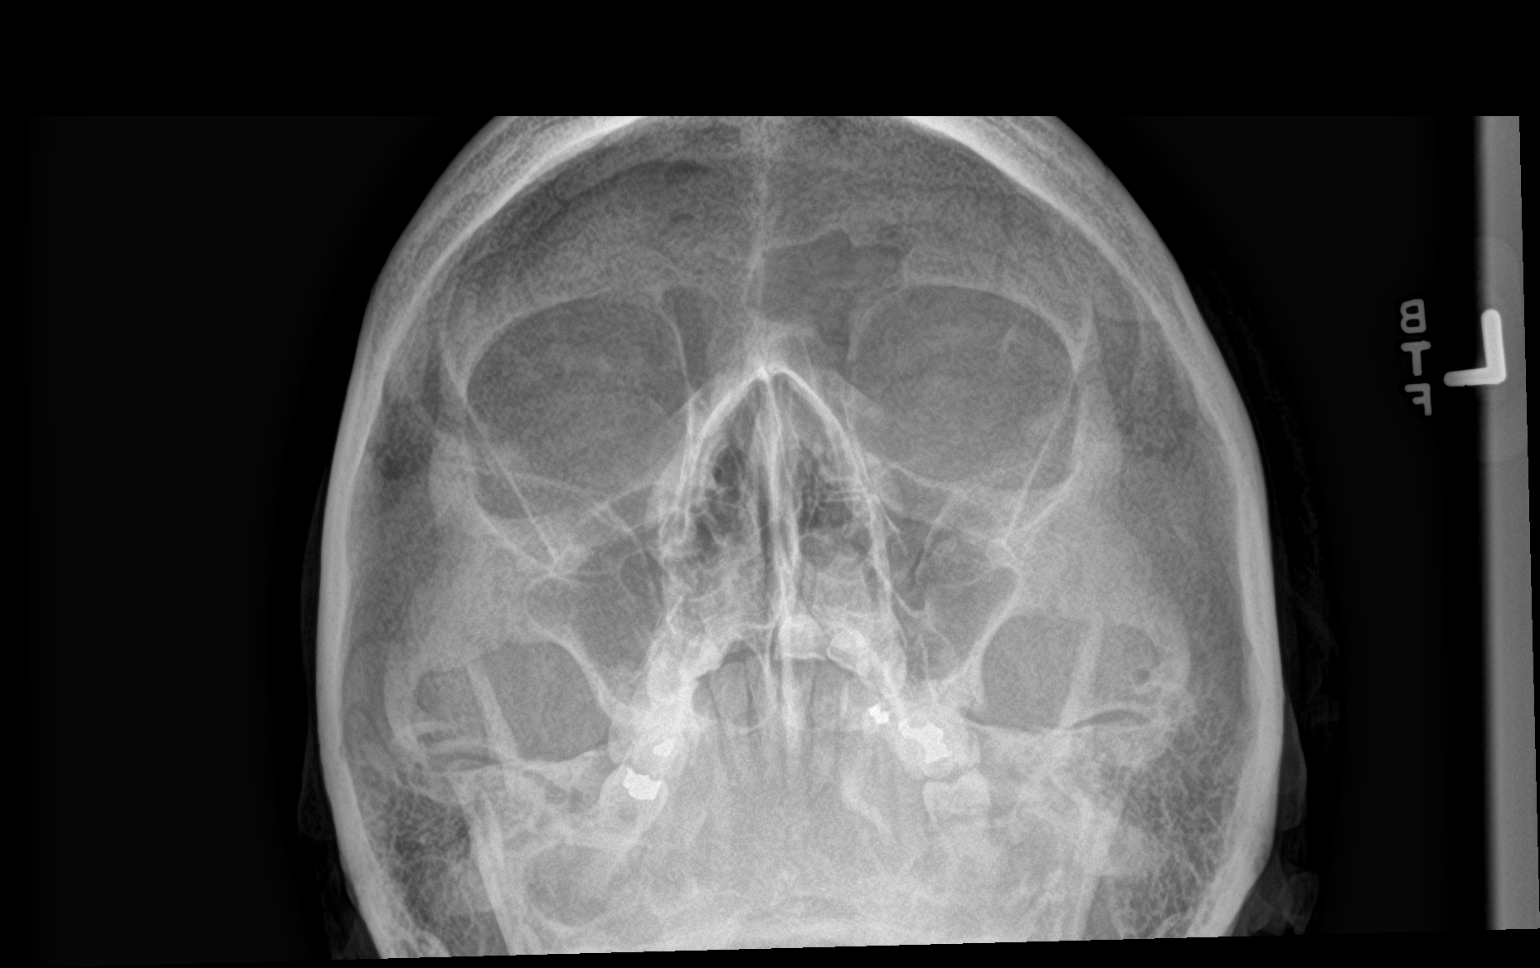

[orbits lat (2 of 2)]
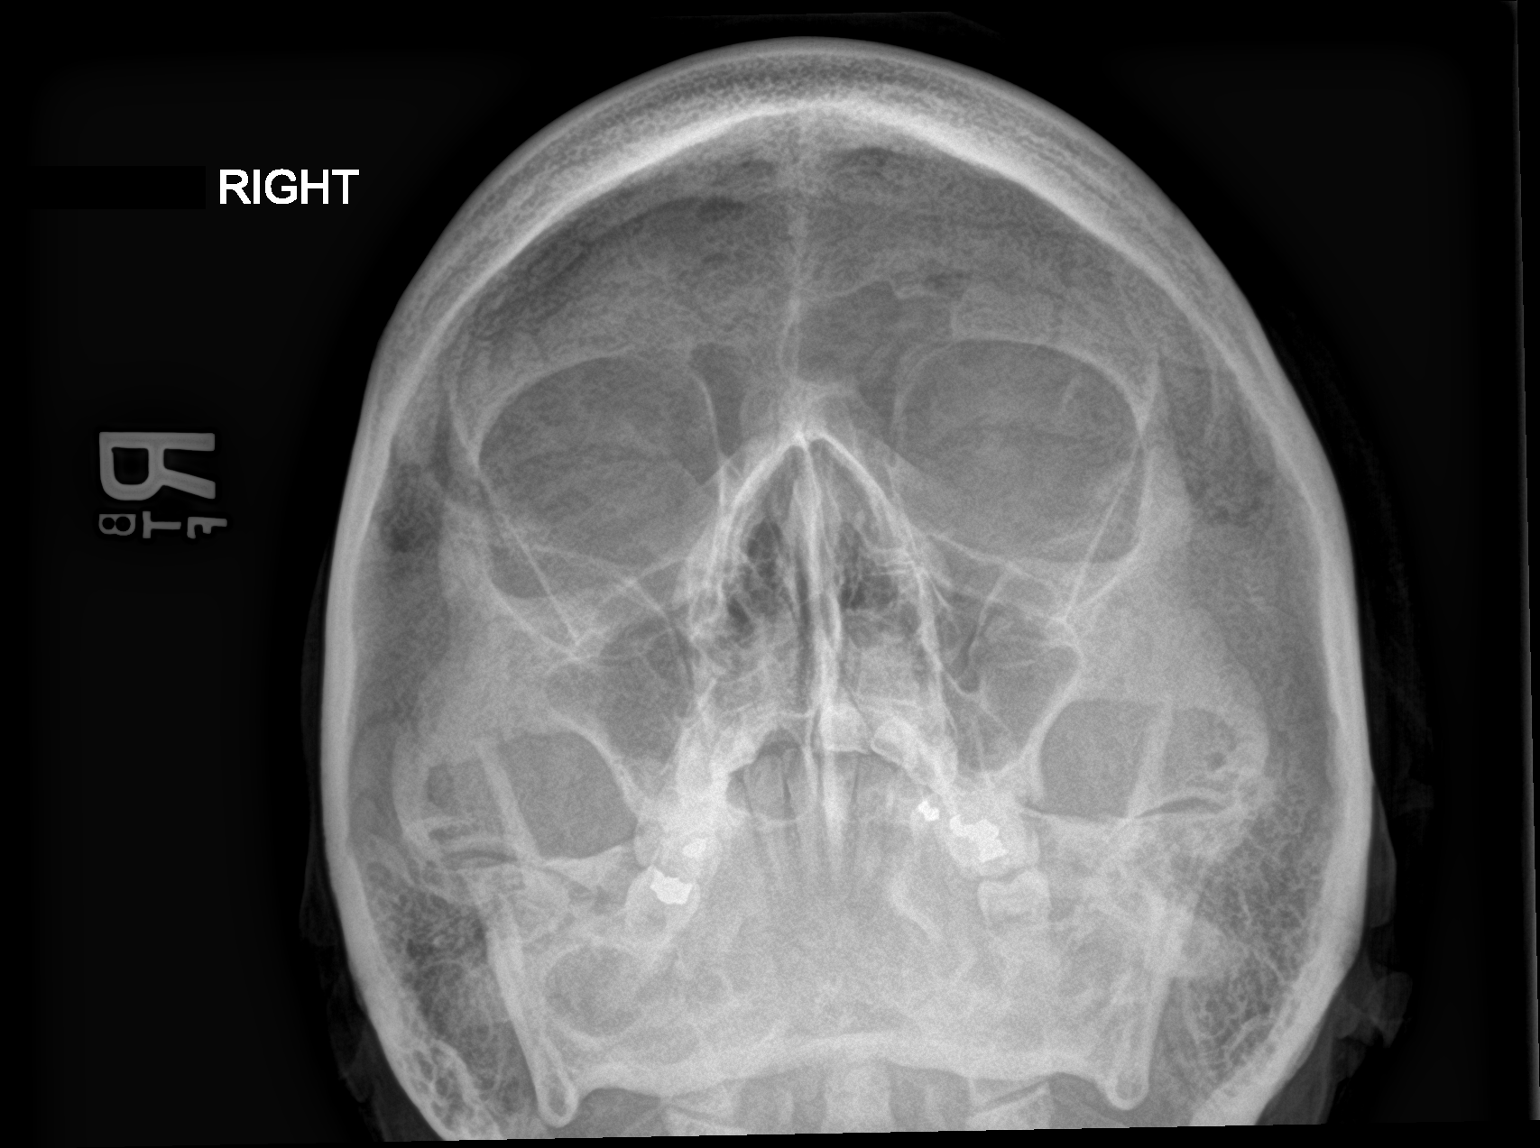

[2 of 2 positions shown; findings below may reference images not displayed]

FINDINGS: Osseous structures unremarkable.

Nasal septum midline.

No orbital metallic foreign bodies identified.
IMPRESSION: No orbital metallic foreign bodies.

## 2021-04-24 MED ORDER — LOSARTAN POTASSIUM 100 MG PO TABS
100.0000 mg | ORAL_TABLET | Freq: Every day | ORAL | 1 refills | Status: DC
Start: 2021-04-24 — End: 2023-02-18

## 2021-04-24 MED ORDER — AMLODIPINE BESYLATE 10 MG PO TABS
10.0000 mg | ORAL_TABLET | Freq: Every day | ORAL | 1 refills | Status: DC
Start: 2021-04-24 — End: 2022-12-12

## 2021-04-24 NOTE — Progress Notes (Signed)
Judy Leonard - 55 y.o. female MRN [id number]  Date of birth: 04-Aug-1966  Office Visit Note: Visit Date: 04/20/2021 PCP: No primary care provider on file. Referred by: Jacquelin Hawking, PA-C  Subjective: Chief Complaint  Patient presents with  . Neck - Pain  . Middle Back - Pain  . Left Shoulder - Pain   HPI:  Judy Leonard is a 55 y.o. female who comes in today at the request of Dr. Darreld Mclean for electrodiagnostic study of the Left upper extremities.  Patient is Right hand dominant.  She describes several months of worsening left hand numbness along with neck pain and mid back pain and shoulder pain.  She does not necessarily always endorse that the neck and shoulder pain occur with the pain and numbness in the hand but she feels like it can coincide.  She denies any right-sided symptoms.  She reports that sitting for a long time and cleaning house makes the pain worse.  She rates this pain as 6 out of 10.  She is tried home exercise and medication without much relief.  She has not had prior electrodiagnostic studies.  She is not diabetic.   ROS Otherwise per HPI.  Assessment & Plan: Visit Diagnoses:    ICD-10-CM   1. Paresthesia of skin  R20.2 NCV with EMG (electromyography)    Plan: Impression: The above electrodiagnostic study is ABNORMAL and reveals evidence of a mild left median nerve entrapment at the wrist (carpal tunnel syndrome) affecting sensory components.   There is no significant electrodiagnostic evidence of any other focal nerve entrapment, brachial plexopathy or cervical radiculopathy.  As you know, this particular electrodiagnostic study cannot rule out chemical radiculitis or sensory only radiculopathy. **This electrodiagnostic study cannot rule out small fiber polyneuropathy and dysesthesias from central pain syndromes such as stroke or central pain sensitization syndromes such as fibromyalgia.  Myotomal referral pain from trigger points is also not  excluded.  Recommendations: 1.  Follow-up with referring physician. 2.  Continue current management of symptoms.  Meds & Orders: No orders of the defined types were placed in this encounter.   Orders Placed This Encounter  Procedures  . NCV with EMG (electromyography)    Follow-up: Return for Darreld Mclean, MD as scheduled.   Procedures: No procedures performed  EMG & NCV Findings: Evaluation of the left median (across palm) sensory nerve showed prolonged distal peak latency (Wrist, 3.8 ms) and prolonged distal peak latency (Palm, 2.2 ms).  All remaining nerves (as indicated in the following tables) were within normal limits.    All examined muscles (as indicated in the following table) showed no evidence of electrical instability.    Impression: The above electrodiagnostic study is ABNORMAL and reveals evidence of a mild left median nerve entrapment at the wrist (carpal tunnel syndrome) affecting sensory components.   There is no significant electrodiagnostic evidence of any other focal nerve entrapment, brachial plexopathy or cervical radiculopathy.  As you know, this particular electrodiagnostic study cannot rule out chemical radiculitis or sensory only radiculopathy. **This electrodiagnostic study cannot rule out small fiber polyneuropathy and dysesthesias from central pain syndromes such as stroke or central pain sensitization syndromes such as fibromyalgia.  Myotomal referral pain from trigger points is also not excluded.  Recommendations: 1.  Follow-up with referring physician. 2.  Continue current management of symptoms.  ___________________________ Elease Hashimoto Board Certified, American Board of Physical Medicine and Rehabilitation    Nerve Conduction Studies Anti Sensory Summary Table  Stim Site NR Peak (ms) Norm Peak (ms) P-T Amp (V) Norm P-T Amp Site1 Site2 Delta-P (ms) Dist (cm) Vel (m/s) Norm Vel (m/s)  Left Median Acr Palm Anti Sensory (2nd Digit)   30.2C  Wrist    *3.8 <3.6 35.1 >10 Wrist Palm 1.6 0.0    Palm    *2.2 <2.0 1.7         Left Radial Anti Sensory (Base 1st Digit)  30.1C  Wrist    2.2 <3.1 26.3  Wrist Base 1st Digit 2.2 0.0    Left Ulnar Anti Sensory (5th Digit)  30.5C  Wrist    3.2 <3.7 19.8 >15.0 Wrist 5th Digit 3.2 14.0 44 >38   Motor Summary Table   Stim Site NR Onset (ms) Norm Onset (ms) O-P Amp (mV) Norm O-P Amp Site1 Site2 Delta-0 (ms) Dist (cm) Vel (m/s) Norm Vel (m/s)  Left Median Motor (Abd Poll Brev)  30.1C  Wrist    3.5 <4.2 7.7 >5 Elbow Wrist 4.0 21.0 53 >50  Elbow    7.5  7.5         Left Ulnar Motor (Abd Dig Min)  30.3C  Wrist    2.8 <4.2 9.2 >3 B Elbow Wrist 3.5 21.0 60 >53  B Elbow    6.3  7.8  A Elbow B Elbow 1.8 10.0 56 >53  A Elbow    8.1  7.6          EMG   Side Muscle Nerve Root Ins Act Fibs Psw Amp Dur Poly Recrt Int Dennie Bible Comment  Left 1stDorInt Ulnar C8-T1 Nml Nml Nml Nml Nml 0 Nml Nml   Left Abd Poll Brev Median C8-T1 Nml Nml Nml Nml Nml 0 Nml Nml   Left ExtDigCom   Nml Nml Nml Nml Nml 0 Nml Nml   Left Triceps Radial C6-7-8 Nml Nml Nml Nml Nml 0 Nml Nml   Left Deltoid Axillary C5-6 Nml Nml Nml Nml Nml 0 Nml Nml     Nerve Conduction Studies Anti Sensory Left/Right Comparison   Stim Site L Lat (ms) R Lat (ms) L-R Lat (ms) L Amp (V) R Amp (V) L-R Amp (%) Site1 Site2 L Vel (m/s) R Vel (m/s) L-R Vel (m/s)  Median Acr Palm Anti Sensory (2nd Digit)  30.2C  Wrist *3.8   35.1   Wrist Palm     Palm *2.2   1.7         Radial Anti Sensory (Base 1st Digit)  30.1C  Wrist 2.2   26.3   Wrist Base 1st Digit     Ulnar Anti Sensory (5th Digit)  30.5C  Wrist 3.2   19.8   Wrist 5th Digit 44     Motor Left/Right Comparison   Stim Site L Lat (ms) R Lat (ms) L-R Lat (ms) L Amp (mV) R Amp (mV) L-R Amp (%) Site1 Site2 L Vel (m/s) R Vel (m/s) L-R Vel (m/s)  Median Motor (Abd Poll Brev)  30.1C  Wrist 3.5   7.7   Elbow Wrist 53    Elbow 7.5   7.5         Ulnar Motor (Abd Dig Min)  30.3C  Wrist  2.8   9.2   B Elbow Wrist 60    B Elbow 6.3   7.8   A Elbow B Elbow 56    A Elbow 8.1   7.6            Waveforms:  Clinical History: MRI CERVICAL SPINE WITHOUT CONTRAST  TECHNIQUE: Multiplanar, multisequence MR imaging of the cervical spine was performed. No intravenous contrast was administered.  COMPARISON:  None.  FINDINGS: The study is degraded by motion.  Alignment: Straightening of the cervical curvature.  Vertebrae: No fracture, evidence of discitis, or bone lesion.  Cord: Flattening of the anterior aspect of the cord at C4-5 and C5-6 without cord signal abnormality.  Posterior Fossa, vertebral arteries, paraspinal tissues: Negative.  Disc levels:  C2-3: No spinal canal stenosis. A left vertebral artery loop within the left neural foramen (series 3, images 2-5) which may cause impingement on the exiting C3 nerve root.  C3-4: Small posterior disc protrusion without significant spinal canal stenosis. Uncovertebral and facet degenerative changes resulting in mild bilateral neural foraminal narrowing.  C4-5: Posterior disc osteophyte complex causing flattening of the anterior aspect of the cord and resulting in mild to moderate spinal canal stenosis. Uncovertebral and facet degenerative changes resulting in moderate bilateral neural foraminal narrowing.  C5-6: Posterior disc osteophyte complex causing mild flattening of the anterior aspect of the cord and resulting in mild spinal canal stenosis. Facet degenerative changes resulting in left neural foraminal narrowing.  C6-7: Small posterior disc protrusion without significant spinal canal stenosis. Uncovertebral and facet degenerative changes resulting in mild left neural foraminal narrowing.  C7-T1: No spinal canal or neural foraminal stenosis.  IMPRESSION: 1. Multilevel degenerative changes of the cervical spine as described above, with mild-to-moderate spinal canal stenosis  at C4-5 and mild spinal canal stenosis at C5-6. 2. Moderate bilateral neural foraminal narrowing at C4-5. 3. 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   Electronically Signed   By: Baldemar Lenis M.D.   On: 08/01/2020 12:31     Objective:  VS:  HT:    WT:   BMI:     BP:   HR: bpm  TEMP: ( )  RESP:  Physical Exam Musculoskeletal:        General: No swelling, tenderness or deformity.     Comments: Inspection reveals no atrophy of the bilateral APB or FDI or hand intrinsics. There is no swelling, color changes, allodynia or dystrophic changes. There is 5 out of 5 strength in the bilateral wrist extension, finger abduction and long finger flexion. There is intact sensation to light touch in all dermatomal and peripheral nerve distributions.  There is a negative Phalen's test bilaterally. There is a negative Hoffmann's test bilaterally.  Skin:    General: Skin is warm and dry.     Findings: No erythema or rash.  Neurological:     General: No focal deficit present.     Mental Status: She is alert and oriented to person, place, and time.     Motor: No weakness or abnormal muscle tone.     Coordination: Coordination normal.  Psychiatric:        Mood and Affect: Mood normal.        Behavior: Behavior normal.      Imaging: No results found.

## 2021-04-24 NOTE — Progress Notes (Signed)
Patient Judy Leonard A. Kot, female DOB:22-Dec-1966, 55 y.o. [id number]  Chief Complaint  Patient presents with  . Arm Pain    Left upper extremity. Patient repots she was told the neck could be causing her numbness also.    HPI  Judy Leonard is a 55 y.o. female who has hand numbness on the left consistent with carpal tunnel syndrome.  She saw Dr. Alvester Morin and had EMG showing left carpal tunnel syndrome, mild.  She would like to have surgery.  I have given splint and will have her see Dr. Romeo Apple or Dr. Dallas Schimke for surgery.   Body mass index is 32.77 kg/m.  ROS  Review of Systems  Constitutional: Positive for activity change.  Musculoskeletal: Positive for arthralgias, myalgias and neck pain.  All other systems reviewed and are negative.   All other systems reviewed and are negative.  The following is a summary of the past history medically, past history surgically, known current medicines, social history and family history.  This information is gathered electronically by the computer from prior information and documentation.  I review this each visit and have found including this information at this point in the chart is beneficial and informative.    No past medical history on file.  Past Surgical History:  Procedure Laterality Date  . APPENDECTOMY  age 31    Family History  Problem Relation Age of Onset  . Diabetes Mother   . Kidney disease Mother   . Hypertension Mother   . Heart failure Mother     Social History Social History   Tobacco Use  . Smoking status: Former Smoker    Packs/day: 0.25    Years: 31.00    Pack years: 7.75    Types: Cigarettes    Quit date: 04/29/2020    Years since quitting: 0.9  . Smokeless tobacco: Never Used  . Tobacco comment: 1-2 cig months  Vaping Use  . Vaping Use: Never used  Substance Use Topics  . Alcohol use: Yes    Comment: occasionally  . Drug use: Not Currently    No Known Allergies  Current Outpatient  Medications  Medication Sig Dispense Refill  . amLODipine (NORVASC) 10 MG tablet Take 1 tablet (10 mg total) by mouth daily. 30 tablet 1  . benzonatate (TESSALON) 100 MG capsule Take by mouth 3 (three) times daily as needed for cough.    . diphenhydrAMINE (BENADRYL) 25 MG tablet Take 25 mg by mouth every 6 (six) hours as needed.    . Fluticasone-Salmeterol (ADVAIR) 100-50 MCG/DOSE AEPB INHALE 1 PUFF BY MOUTH TWICE DAILY (EVERY 12 HOURS). RINSE MOUTH AFTER USE. - USE IN THE MORNING AND AT BEDTIME 120 each 1  . HYDROcodone-acetaminophen (NORCO/VICODIN) 5-325 MG tablet One tablet every four hours for pain. 26 tablet 0  . losartan (COZAAR) 100 MG tablet Take 1 tablet (100 mg total) by mouth daily. 30 tablet 1  . naproxen (NAPROSYN) 500 MG tablet Take 1 tablet (500 mg total) by mouth 2 (two) times daily with a meal. 60 tablet 5  . PROVENTIL HFA 108 (90 Base) MCG/ACT inhaler INHALE 2 PUFFS BY MOUTH EVERY 6 HOURS AS NEEDED FOR COUGHING, WHEEZING, OR SHORTNESS OF BREATH 13.4 g 1  . tiZANidine (ZANAFLEX) 4 MG tablet One by mouth every night before bed as needed for spasm 30 tablet 1  . potassium chloride (KLOR-CON) 10 MEQ tablet Take 1 tablet (10 mEq total) by mouth daily for 14 days. 14 tablet 0   No current  facility-administered medications for this visit.     Physical Exam  Height 5\' 6"  (1.676 m), weight 203 lb (92.1 kg), last menstrual period 04/28/2015.  Constitutional: overall normal hygiene, normal nutrition, well developed, normal grooming, normal body habitus. Assistive device:none  Musculoskeletal: gait and station Limp none, muscle tone and strength are normal, no tremors or atrophy is present.  .  Neurological: coordination overall normal.  Deep tendon reflex/nerve stretch intact.  Sensation normal.  Cranial nerves II-XII intact.   Skin:   Normal overall no scars, lesions, ulcers or rashes. No psoriasis.  Psychiatric: Alert and oriented x 3.  Recent memory intact, remote memory  unclear.  Normal mood and affect. Well groomed.  Good eye contact.  Cardiovascular: overall no swelling, no varicosities, no edema bilaterally, normal temperatures of the legs and arms, no clubbing, cyanosis and good capillary refill.  Lymphatic: palpation is normal.  All other systems reviewed and are negative   The patient has been educated about the nature of the problem(s) and counseled on treatment options.  The patient appeared to understand what I have discussed and is in agreement with it. Encounter Diagnosis  Name Primary?  . Carpal tunnel syndrome, left upper limb Yes    PLAN Call if any problems.  Precautions discussed.  Continue current medications.   Cockup splint given.  Return to clinic to see Dr. 04/30/2015 or Dr. Romeo Apple for sugery.   Electronically Signed Dallas Schimke, MD 4/26/20222:43 PM

## 2021-04-30 ENCOUNTER — Ambulatory Visit: Payer: Medicaid Other | Admitting: Physician Assistant

## 2021-05-02 ENCOUNTER — Encounter: Payer: Self-pay | Admitting: Orthopedic Surgery

## 2021-05-02 ENCOUNTER — Ambulatory Visit (INDEPENDENT_AMBULATORY_CARE_PROVIDER_SITE_OTHER): Payer: Medicare Other | Admitting: Orthopedic Surgery

## 2021-05-02 ENCOUNTER — Telehealth: Payer: Self-pay

## 2021-05-02 ENCOUNTER — Other Ambulatory Visit: Payer: Self-pay

## 2021-05-02 VITALS — BP 152/94 | HR 100 | Ht 66.0 in | Wt 200.0 lb

## 2021-05-02 DIAGNOSIS — G5602 Carpal tunnel syndrome, left upper limb: Secondary | ICD-10-CM

## 2021-05-02 DIAGNOSIS — M542 Cervicalgia: Secondary | ICD-10-CM | POA: Diagnosis not present

## 2021-05-02 MED ORDER — TIZANIDINE HCL 4 MG PO TABS
ORAL_TABLET | ORAL | 1 refills | Status: DC
Start: 2021-05-02 — End: 2021-07-24

## 2021-05-02 NOTE — Progress Notes (Signed)
New Patient Visit  Assessment: Judy Leonard is a 55 y.o. female with the following: Mild carpal tunnel syndrome, left wrist Cervical stenosis; radicular pain  Plan: Patient referred to clinic today for carpal tunnel syndrome in her left wrist.  She does continue to have symptoms, although they are mild overall.  In addition, her symptoms have improved over the last week while she uses a brace.  More concerning at this time, is the description of her pain starting in her neck, radiating distally into her shoulder with symptoms radiating into the hand as well.  She has a recent MRI which demonstrates some stenosis.  She also endorses issues with her balance and poor handwriting.  I am not convinced that addressing her mild carpal tunnel with surgery on the left, will alleviate her constellation of symptoms.  As a result, I would like her to be evaluated by a neurosurgeon, with concerns that a lot of her symptoms could be related to nerve compression within her cervical spine.  She stated her understanding.  I have encouraged her to continue using the brace, stressing the use at nighttime preferably.  She stated understanding.  No follow-up is needed at this time.  However, if neurosurgery is not concerned about her neck, we could revisit the possibility of surgery on her left wrist at a later date.   Follow-up: Return if symptoms worsen or fail to improve.  Subjective:  Chief Complaint  Patient presents with  . Wrist Pain    Pt had a positive EMG by Dr. Alvester Morin. Has numbness in left hand off and on for 7-8 months.     History of Present Illness: Judy Leonard is a 55 y.o. RHD female who presents for evaluation of left hand pain, numbness and tingling.  She has had the symptoms in her hand for the last 7-8 months.  She has been evaluated by Dr. Alvester Morin, and an EMG demonstrated mild carpal tunnel.  Approximately 1 week ago, she started using a cock up wrist splint, notes improvement in  her symptoms.  In addition, she has some pain in her neck, with radiating pains into her left shoulder and occasionally into her left hand.  She has had an MRI in the past.  This was positive for some central stenosis.  Nothing further has been done with the MRI findings.  She has been taking Norco for pain.  She notes that she has had issues with her balance in the past.  She also states that her handwriting is horrible.   Review of Systems: No fevers or chills + numbness or tingling No chest pain No shortness of breath No bowel or bladder dysfunction No GI distress No headaches   Medical History:  History reviewed. No pertinent past medical history.  Past Surgical History:  Procedure Laterality Date  . APPENDECTOMY  age 58    Family History  Problem Relation Age of Onset  . Diabetes Mother   . Kidney disease Mother   . Hypertension Mother   . Heart failure Mother    Social History   Tobacco Use  . Smoking status: Former Smoker    Packs/day: 0.25    Years: 31.00    Pack years: 7.75    Types: Cigarettes    Quit date: 04/29/2020    Years since quitting: 1.0  . Smokeless tobacco: Never Used  . Tobacco comment: 1-2 cig months  Vaping Use  . Vaping Use: Never used  Substance Use Topics  . Alcohol  use: Yes    Comment: occasionally  . Drug use: Not Currently    No Known Allergies  Current Meds  Medication Sig  . amLODipine (NORVASC) 10 MG tablet Take 1 tablet (10 mg total) by mouth daily.  . benzonatate (TESSALON) 100 MG capsule Take by mouth 3 (three) times daily as needed for cough.  . diphenhydrAMINE (BENADRYL) 25 MG tablet Take 25 mg by mouth every 6 (six) hours as needed.  . Fluticasone-Salmeterol (ADVAIR) 100-50 MCG/DOSE AEPB INHALE 1 PUFF BY MOUTH TWICE DAILY (EVERY 12 HOURS). RINSE MOUTH AFTER USE. - USE IN THE MORNING AND AT BEDTIME  . HYDROcodone-acetaminophen (NORCO/VICODIN) 5-325 MG tablet One tablet every four hours for pain.  Marland Kitchen losartan (COZAAR) 100 MG  tablet Take 1 tablet (100 mg total) by mouth daily.  . naproxen (NAPROSYN) 500 MG tablet Take 1 tablet (500 mg total) by mouth 2 (two) times daily with a meal.  . PROVENTIL HFA 108 (90 Base) MCG/ACT inhaler INHALE 2 PUFFS BY MOUTH EVERY 6 HOURS AS NEEDED FOR COUGHING, WHEEZING, OR SHORTNESS OF BREATH  . tiZANidine (ZANAFLEX) 4 MG tablet One by mouth every night before bed as needed for spasm    Objective: BP (!) 152/94   Pulse 100   Ht 5\' 6"  (1.676 m)   Wt 200 lb (90.7 kg)   LMP 04/28/2015   BMI 32.28 kg/m   Physical Exam:  General: Alert and oriented.  No acute distress. Gait: Normal  Evaluation of left hand demonstrates no deformity.  No atrophy is appreciated.  Mildly positive carpal tunnel compression test.  Mildly positive Tinel's at the carpal tunnel.  Full range of motion about her wrist.  Fingers are warm and well-perfused.  Negative Hoffmann's.  Slightly restricted cervical spine range of motion.   IMAGING: No new imaging obtained today   EMG findings suggest mild sensory latency.  Mild carpal tunnel overall.  Review of MRI of her cervical spine demonstrates central stenosis at the C4-5 and C5-6 levels.  Some flattening of the anterior aspect of the cord.  Left-sided facet degenerative changes at C5-6.  Overall, some mild to moderate central stenosis at these levels.   New Medications:  No orders of the defined types were placed in this encounter.     04/30/2015, MD  05/02/2021 12:35 PM

## 2021-05-02 NOTE — Patient Instructions (Signed)
Referral to neurosurgery.

## 2021-05-02 NOTE — Telephone Encounter (Signed)
Zanaflex 4 mg Qty 30 Tablets   PATIENT USES [facility] ON [address]

## 2021-05-11 ENCOUNTER — Other Ambulatory Visit: Payer: Self-pay | Admitting: Neurosurgery

## 2021-05-17 ENCOUNTER — Telehealth: Payer: Self-pay | Admitting: Orthopaedic Surgery

## 2021-05-17 NOTE — Telephone Encounter (Signed)
Patient called to ask if Dr Hilda Lias may refill her pain medication; states she is scheduled for surgery with Dr Jordan Likes,  July 23, 2021.  HYDROcodone-acetaminophen (NORCO/VICODIN) 5-325 MG tablet 26 tablet  [facility], [address], [place]

## 2021-05-18 MED ORDER — HYDROCODONE-ACETAMINOPHEN 5-325 MG PO TABS: ORAL_TABLET | ORAL | 0 refills | Status: DC

## 2021-05-18 NOTE — Telephone Encounter (Signed)
Please advise.  If she is already schedule for surgery with another doctor then she is under his care and should get medication from that doctor, right?

## 2021-05-18 NOTE — Telephone Encounter (Signed)
P 

## 2021-07-18 NOTE — Pre-Procedure Instructions (Signed)
Surgical Instructions    Your procedure is scheduled on 07/23/21.  Report to [facility] Main Entrance "A" at 0600 A.M., then check in with the Admitting office.  Call this number if you have problems the morning of surgery:  [phone]   If you have any questions prior to your surgery date call [phone]: Open Monday-Friday 8am-4pm    Remember:  Do not eat or drink after midnight the night before your surgery   Take these medicines the morning of surgery with A SIP OF WATER  amLODipine (NORVASC) Fluticasone-Salmeterol (ADVAIR)  HYDROcodone-acetaminophen (NORCO/VICODIN) if needed PROVENTIL HFA 108 (90 Base) if needed  Please bring all inhalers with you the day of surgery.   As of today, STOP taking any Aspirin (unless otherwise instructed by your surgeon) Aleve, Naproxen, Ibuprofen, Motrin, Advil, Goody's, BC's, all herbal medications, fish oil, and all vitamins.          Do not wear jewelry or makeup Do not wear lotions, powders, perfumes/colognes, or deodorant. Do not shave 48 hours prior to surgery.  Men may shave face and neck. Do not bring valuables to the hospital. DO Not wear nail polish, gel polish, artificial nails, or any other type of covering on  natural nails including finger and toenails. If patients have artificial nails, gel coating, etc. that need to be removed by a nail salon please have this removed prior to surgery or surgery may need to be canceled/delayed if the surgeon/ anesthesia feels like the patient is unable to be adequately monitored.             [facility] is not responsible for any belongings or valuables.  Do NOT Smoke (Tobacco/Vaping) or drink Alcohol 24 hours prior to your procedure If you use a CPAP at night, you may bring all equipment for your overnight stay.   Contacts, glasses, dentures or bridgework may not be worn into surgery, please bring cases for these belongings   For patients admitted to the hospital, discharge time will be  determined by your treatment team.   Patients discharged the day of surgery will not be allowed to drive home, and someone needs to stay with them for 24 hours.  ONLY 1 SUPPORT PERSON MAY BE PRESENT WHILE YOU ARE IN SURGERY. IF YOU ARE TO BE ADMITTED ONCE YOU ARE IN YOUR ROOM YOU WILL BE ALLOWED TWO (2) VISITORS.  Minor children may have two parents present. Special consideration for safety and communication needs will be reviewed on a case by case basis.  Special instructions:    Oral Hygiene is also important to reduce your risk of infection.  Remember - BRUSH YOUR TEETH THE MORNING OF SURGERY WITH YOUR REGULAR TOOTHPASTE   [facility]- Preparing For Surgery  Before surgery, you can play an important role. Because skin is not sterile, your skin needs to be as free of germs as possible. You can reduce the number of germs on your skin by washing with CHG (chlorahexidine gluconate) Soap before surgery.  CHG is an antiseptic cleaner which kills germs and bonds with the skin to continue killing germs even after washing.     Please do not use if you have an allergy to CHG or antibacterial soaps. If your skin becomes reddened/irritated stop using the CHG.  Do not shave (including legs and underarms) for at least 48 hours prior to first CHG shower. It is OK to shave your face.  Please follow these instructions carefully.     Shower the [employer]  BEFORE SURGERY and the MORNING OF SURGERY with CHG Soap.   If you chose to wash your hair, wash your hair first as usual with your normal shampoo. After you shampoo, rinse your hair and body thoroughly to remove the shampoo.  Then [employer] and genitals (private parts) with your normal soap and rinse thoroughly to remove soap.  After that Use CHG Soap as you would any other liquid soap. You can apply CHG directly to the skin and wash gently with a scrungie or a clean washcloth.   Apply the CHG Soap to your body ONLY FROM THE NECK DOWN.  Do not use on open  wounds or open sores. Avoid contact with your eyes, ears, mouth and genitals (private parts). Wash Face and genitals (private parts)  with your normal soap.   Wash thoroughly, paying special attention to the area where your surgery will be performed.  Thoroughly rinse your body with warm water from the neck down.  DO NOT shower/wash with your normal soap after using and rinsing off the CHG Soap.  Pat yourself dry with a CLEAN TOWEL.  Wear CLEAN PAJAMAS to bed the night before surgery  Place CLEAN SHEETS on your bed the night before your surgery  DO NOT SLEEP WITH PETS.   Day of Surgery:  Take a shower with CHG soap. Wear Clean/Comfortable clothing the morning of surgery Do not apply any deodorants/lotions.   Remember to brush your teeth WITH YOUR REGULAR TOOTHPASTE.   Please read over the following fact sheets that you were given.

## 2021-07-19 ENCOUNTER — Encounter (HOSPITAL_COMMUNITY): Payer: Self-pay

## 2021-07-19 ENCOUNTER — Encounter (HOSPITAL_COMMUNITY)
Admission: RE | Admit: 2021-07-19 | Discharge: 2021-07-19 | Disposition: A | Discharge status: Home / Self Care | Payer: Medicare Other | Source: Ambulatory Visit | Attending: Neurosurgery | Admitting: Neurosurgery

## 2021-07-19 ENCOUNTER — Other Ambulatory Visit: Payer: Self-pay

## 2021-07-19 DIAGNOSIS — Z20822 Contact with and (suspected) exposure to covid-19: Secondary | ICD-10-CM | POA: Insufficient documentation

## 2021-07-19 DIAGNOSIS — Z01818 Encounter for other preprocedural examination: Secondary | ICD-10-CM | POA: Insufficient documentation

## 2021-07-19 HISTORY — DX: Essential (primary) hypertension: I10

## 2021-07-19 HISTORY — DX: Unspecified asthma, uncomplicated: J45.909

## 2021-07-19 HISTORY — DX: Unspecified osteoarthritis, unspecified site: M19.90

## 2021-07-19 LAB — CBC WITH DIFFERENTIAL/PLATELET
Abs Immature Granulocytes: 0.04 10*3/uL (ref 0.00–0.07)
Basophils Absolute: 0.1 10*3/uL (ref 0.0–0.1)
Basophils Relative: 1 %
Eosinophils Absolute: 0.1 10*3/uL (ref 0.0–0.5)
Eosinophils Relative: 1 %
HCT: 48.7 % — ABNORMAL HIGH (ref 36.0–46.0)
Hemoglobin: 16.1 g/dL — ABNORMAL HIGH (ref 12.0–15.0)
Immature Granulocytes: 0 %
Lymphocytes Relative: 22 %
Lymphs Abs: 2.1 10*3/uL (ref 0.7–4.0)
MCH: 32.3 pg (ref 26.0–34.0)
MCHC: 33.1 g/dL (ref 30.0–36.0)
MCV: 97.8 fL (ref 80.0–100.0)
Monocytes Absolute: 0.8 10*3/uL (ref 0.1–1.0)
Monocytes Relative: 8 %
Neutro Abs: 6.6 10*3/uL (ref 1.7–7.7)
Neutrophils Relative %: 68 %
Platelets: 301 10*3/uL (ref 150–400)
RBC: 4.98 MIL/uL (ref 3.87–5.11)
RDW: 12.8 % (ref 11.5–15.5)
WBC: 9.8 10*3/uL (ref 4.0–10.5)
nRBC: 0 % (ref 0.0–0.2)

## 2021-07-19 LAB — BASIC METABOLIC PANEL
Anion gap: 9 (ref 5–15)
BUN: 11 mg/dL (ref 6–20)
CO2: 27 mmol/L (ref 22–32)
Calcium: 9.1 mg/dL (ref 8.9–10.3)
Chloride: 105 mmol/L (ref 98–111)
Creatinine, Ser: 1.07 mg/dL — ABNORMAL HIGH (ref 0.44–1.00)
GFR, Estimated: >60 mL/min (ref >60)
Glucose, Bld: 104 mg/dL — ABNORMAL HIGH (ref 70–99)
Potassium: 3.8 mmol/L (ref 3.5–5.1)
Sodium: 141 mmol/L (ref 135–145)

## 2021-07-19 LAB — SURGICAL PCR SCREEN
MRSA, PCR: POSITIVE — AB
Staphylococcus aureus: POSITIVE — AB

## 2021-07-19 LAB — SARS CORONAVIRUS 2 (TAT 6-24 HRS): SARS Coronavirus 2: NEGATIVE

## 2021-07-19 NOTE — Progress Notes (Signed)
PCP: [name], in [place] Cardiologist: denies  EKG: 07/19/21 CXR: na ECHO: denies Stress Test: denies Cardiac Cath: denies  Fasting Blood Sugar- na Checks Blood Sugar_na__ times a day  OSA/CPAP: No  ASA/Blood Thinner: No  Covid test 07/19/21 at PAT  Anesthesia Review: No  Patient denies shortness of breath, fever, cough, and chest pain at PAT appointment.  Patient verbalized understanding of instructions provided today at the PAT appointment.  Patient asked to review instructions at home and day of surgery.

## 2021-07-19 NOTE — Pre-Procedure Instructions (Signed)
Surgical Instructions    Your procedure is scheduled on 07/23/21.  Report to [facility] Main Entrance "A" at 0600 A.M., then check in with the Admitting office.  Call this number if you have problems the morning of surgery:  [phone]   If you have any questions prior to your surgery date call [phone]: Open Monday-Friday 8am-4pm    Remember:  Do not eat or drink after midnight the night before your surgery   Take these medicines the morning of surgery with A SIP OF WATER:   amLODipine (NORVASC) buPROPion (ZYBAN)  hydrOXYzine (VISTARIL) - if needed Fluticasone-Salmeterol (ADVAIR)  HYDROcodone-acetaminophen (NORCO/VICODIN) if needed PROVENTIL HFA 108 (90 Base) if needed  Please bring all inhalers with you the day of surgery.   As of today, STOP taking any Aspirin (unless otherwise instructed by your surgeon) Aleve, Naproxen, Ibuprofen, Motrin, Advil, Goody's, BC's, all herbal medications, fish oil, and all vitamins.          Do not wear jewelry or makeup Do not wear lotions, powders, perfumes/colognes, or deodorant. Do not shave 48 hours prior to surgery.  Men may shave face and neck. Do not bring valuables to the hospital. DO Not wear nail polish, gel polish, artificial nails, or any other type of covering on  natural nails including finger and toenails. If patients have artificial nails, gel coating, etc. that need to be removed by a nail salon please have this removed prior to surgery or surgery may need to be canceled/delayed if the surgeon/ anesthesia feels like the patient is unable to be adequately monitored.             [facility] is not responsible for any belongings or valuables.  Do NOT Smoke (Tobacco/Vaping) or drink Alcohol 24 hours prior to your procedure If you use a CPAP at night, you may bring all equipment for your overnight stay.   Contacts, glasses, dentures or bridgework may not be worn into surgery, please bring cases for these belongings   For  patients admitted to the hospital, discharge time will be determined by your treatment team.   Patients discharged the day of surgery will not be allowed to drive home, and someone needs to stay with them for 24 hours.  ONLY 1 SUPPORT PERSON MAY BE PRESENT WHILE YOU ARE IN SURGERY. IF YOU ARE TO BE ADMITTED ONCE YOU ARE IN YOUR ROOM YOU WILL BE ALLOWED TWO (2) VISITORS.  Minor children may have two parents present. Special consideration for safety and communication needs will be reviewed on a case by case basis.  Special instructions:    Oral Hygiene is also important to reduce your risk of infection.  Remember - BRUSH YOUR TEETH THE MORNING OF SURGERY WITH YOUR REGULAR TOOTHPASTE   [facility]- Preparing For Surgery  Before surgery, you can play an important role. Because skin is not sterile, your skin needs to be as free of germs as possible. You can reduce the number of germs on your skin by washing with CHG (chlorahexidine gluconate) Soap before surgery.  CHG is an antiseptic cleaner which kills germs and bonds with the skin to continue killing germs even after washing.     Please do not use if you have an allergy to CHG or antibacterial soaps. If your skin becomes reddened/irritated stop using the CHG.  Do not shave (including legs and underarms) for at least 48 hours prior to first CHG shower. It is OK to shave your face.  Please follow these  instructions carefully.     Shower the NIGHT BEFORE SURGERY and the MORNING OF SURGERY with CHG Soap.   If you chose to wash your hair, wash your hair first as usual with your normal shampoo. After you shampoo, rinse your hair and body thoroughly to remove the shampoo.  Then [employer] and genitals (private parts) with your normal soap and rinse thoroughly to remove soap.  After that Use CHG Soap as you would any other liquid soap. You can apply CHG directly to the skin and wash gently with a scrungie or a clean washcloth.   Apply the CHG Soap to  your body ONLY FROM THE NECK DOWN.  Do not use on open wounds or open sores. Avoid contact with your eyes, ears, mouth and genitals (private parts). Wash Face and genitals (private parts)  with your normal soap.   Wash thoroughly, paying special attention to the area where your surgery will be performed.  Thoroughly rinse your body with warm water from the neck down.  DO NOT shower/wash with your normal soap after using and rinsing off the CHG Soap.  Pat yourself dry with a CLEAN TOWEL.  Wear CLEAN PAJAMAS to bed the night before surgery  Place CLEAN SHEETS on your bed the night before your surgery  DO NOT SLEEP WITH PETS.   Day of Surgery:  Take a shower with CHG soap. Wear Clean/Comfortable clothing the morning of surgery Do not apply any deodorants/lotions.   Remember to brush your teeth WITH YOUR REGULAR TOOTHPASTE.   Please read over the following fact sheets that you were given.

## 2021-07-22 NOTE — Anesthesia Preprocedure Evaluation (Addendum)
Anesthesia Evaluation  Patient identified by MRN, date of birth, ID band Patient awake    Reviewed: Allergy & Precautions, NPO status , Patient's Chart, lab work & pertinent test results  Airway Mallampati: III  TM Distance: >3 FB Neck ROM: Limited    Dental no notable dental hx. (+) Poor Dentition, Dental Advisory Given, Partial Upper   Pulmonary asthma , COPD,  COPD inhaler, Current Smoker and Patient abstained from smoking.,    Pulmonary exam normal breath sounds clear to auscultation       Cardiovascular hypertension, Pt. on medications Normal cardiovascular exam Rhythm:Regular Rate:Normal  ECG: NSR, rate 96   Neuro/Psych negative psych ROS   GI/Hepatic negative GI ROS, Neg liver ROS,   Endo/Other  negative endocrine ROS  Renal/GU negative Renal ROS     Musculoskeletal  (+) Arthritis ,   Abdominal (+) + obese,   Peds  Hematology negative hematology ROS (+)   Anesthesia Other Findings Cervical Stenosis - myelopathy  Reproductive/Obstetrics                           Anesthesia Physical Anesthesia Plan  ASA: 2  Anesthesia Plan: General   Post-op Pain Management:    Induction: Intravenous  PONV Risk Score and Plan: 2 and Ondansetron, Dexamethasone, Midazolam and Treatment may vary due to age or medical condition  Airway Management Planned: Oral ETT and Video Laryngoscope Planned  Additional Equipment:   Intra-op Plan:   Post-operative Plan: Extubation in OR  Informed Consent: I have reviewed the patients History and Physical, chart, labs and discussed the procedure including the risks, benefits and alternatives for the proposed anesthesia with the patient or authorized representative who has indicated his/her understanding and acceptance.     Dental advisory given  Plan Discussed with: CRNA  Anesthesia Plan Comments:        Anesthesia Quick Evaluation

## 2021-07-23 ENCOUNTER — Ambulatory Visit (HOSPITAL_COMMUNITY): Payer: Medicare Other | Admitting: Anesthesiology

## 2021-07-23 ENCOUNTER — Encounter (HOSPITAL_COMMUNITY): Payer: Self-pay | Admitting: Neurosurgery

## 2021-07-23 ENCOUNTER — Encounter (HOSPITAL_COMMUNITY)
Admission: RE | Disposition: A | Discharge status: Home / Self Care | Payer: Self-pay | Source: Home / Self Care | Attending: Neurosurgery

## 2021-07-23 ENCOUNTER — Other Ambulatory Visit: Payer: Self-pay

## 2021-07-23 ENCOUNTER — Ambulatory Visit (HOSPITAL_COMMUNITY): Payer: Medicare Other

## 2021-07-23 ENCOUNTER — Observation Stay (HOSPITAL_COMMUNITY)
Admission: RE | Admit: 2021-07-23 | Discharge: 2021-07-24 | Disposition: A | Discharge status: Home / Self Care | Payer: Medicare Other | Attending: Neurosurgery | Admitting: Neurosurgery

## 2021-07-23 DIAGNOSIS — M4802 Spinal stenosis, cervical region: Secondary | ICD-10-CM | POA: Diagnosis not present

## 2021-07-23 DIAGNOSIS — M4722 Other spondylosis with radiculopathy, cervical region: Secondary | ICD-10-CM | POA: Diagnosis not present

## 2021-07-23 DIAGNOSIS — J45909 Unspecified asthma, uncomplicated: Secondary | ICD-10-CM | POA: Diagnosis not present

## 2021-07-23 DIAGNOSIS — F17210 Nicotine dependence, cigarettes, uncomplicated: Secondary | ICD-10-CM | POA: Diagnosis not present

## 2021-07-23 DIAGNOSIS — M4712 Other spondylosis with myelopathy, cervical region: Secondary | ICD-10-CM | POA: Diagnosis not present

## 2021-07-23 DIAGNOSIS — Z79899 Other long term (current) drug therapy: Secondary | ICD-10-CM | POA: Diagnosis not present

## 2021-07-23 DIAGNOSIS — Z419 Encounter for procedure for purposes other than remedying health state, unspecified: Secondary | ICD-10-CM

## 2021-07-23 DIAGNOSIS — I10 Essential (primary) hypertension: Secondary | ICD-10-CM | POA: Insufficient documentation

## 2021-07-23 DIAGNOSIS — Z7982 Long term (current) use of aspirin: Secondary | ICD-10-CM | POA: Insufficient documentation

## 2021-07-23 HISTORY — PX: ANTERIOR CERVICAL DECOMP/DISCECTOMY FUSION: SHX1161

## 2021-07-23 LAB — HEPATITIS C ANTIBODY: HCV Ab: REACTIVE — AB

## 2021-07-23 SURGERY — ANTERIOR CERVICAL DECOMPRESSION/DISCECTOMY FUSION 2 LEVELS
Anesthesia: General

## 2021-07-23 MED ORDER — CEFAZOLIN SODIUM-DEXTROSE 1-4 GM/50ML-% IV SOLN
1.0000 g | Freq: Three times a day (TID) | INTRAVENOUS | Status: AC
Start: 2021-07-23 — End: 2021-07-24
  Administered 2021-07-23 (×2): 1 g via INTRAVENOUS
  Filled 2021-07-23 (×2): qty 50

## 2021-07-23 MED ORDER — FENTANYL CITRATE (PF) 250 MCG/5ML IJ SOLN
INTRAMUSCULAR | Status: DC | PRN
  Administered 2021-07-23: 150 ug via INTRAVENOUS
  Administered 2021-07-23: 100 ug via INTRAVENOUS

## 2021-07-23 MED ORDER — 0.9 % SODIUM CHLORIDE (POUR BTL) OPTIME
TOPICAL | Status: DC | PRN
  Administered 2021-07-23: 1000 mL

## 2021-07-23 MED ORDER — MIDAZOLAM HCL 5 MG/5ML IJ SOLN
INTRAMUSCULAR | Status: DC | PRN
  Administered 2021-07-23: 2 mg via INTRAVENOUS

## 2021-07-23 MED ORDER — MIDAZOLAM HCL 2 MG/2ML IJ SOLN
INTRAMUSCULAR | Status: AC
  Filled 2021-07-23: qty 2

## 2021-07-23 MED ORDER — ONDANSETRON HCL 4 MG/2ML IJ SOLN: 4.0000 mg | Freq: Four times a day (QID) | INTRAMUSCULAR | Status: DC | PRN

## 2021-07-23 MED ORDER — AMLODIPINE BESYLATE 5 MG PO TABS
10.0000 mg | ORAL_TABLET | Freq: Every day | ORAL | Status: DC
  Administered 2021-07-24: 10 mg via ORAL
  Filled 2021-07-23: qty 2

## 2021-07-23 MED ORDER — SUGAMMADEX SODIUM 200 MG/2ML IV SOLN
INTRAVENOUS | Status: DC | PRN
  Administered 2021-07-23: 200 mg via INTRAVENOUS

## 2021-07-23 MED ORDER — ACETAMINOPHEN 500 MG PO TABS
1000.0000 mg | ORAL_TABLET | Freq: Once | ORAL | Status: AC
  Administered 2021-07-23: 1000 mg via ORAL
  Filled 2021-07-23: qty 2

## 2021-07-23 MED ORDER — FENTANYL CITRATE (PF) 100 MCG/2ML IJ SOLN
INTRAMUSCULAR | Status: AC
  Filled 2021-07-23: qty 2

## 2021-07-23 MED ORDER — MOMETASONE FURO-FORMOTEROL FUM 100-5 MCG/ACT IN AERO
2.0000 | INHALATION_SPRAY | Freq: Two times a day (BID) | RESPIRATORY_TRACT | Status: DC
  Administered 2021-07-23: 2 via RESPIRATORY_TRACT
  Filled 2021-07-23: qty 8.8

## 2021-07-23 MED ORDER — CHLORHEXIDINE GLUCONATE CLOTH 2 % EX PADS: 6.0000 | MEDICATED_PAD | Freq: Once | CUTANEOUS | Status: DC

## 2021-07-23 MED ORDER — FENTANYL CITRATE (PF) 250 MCG/5ML IJ SOLN
INTRAMUSCULAR | Status: AC
  Filled 2021-07-23: qty 5

## 2021-07-23 MED ORDER — ROCURONIUM BROMIDE 10 MG/ML (PF) SYRINGE
PREFILLED_SYRINGE | INTRAVENOUS | Status: AC
  Filled 2021-07-23: qty 10

## 2021-07-23 MED ORDER — SODIUM CHLORIDE 0.9% FLUSH: 3.0000 mL | INTRAVENOUS | Status: DC | PRN

## 2021-07-23 MED ORDER — TIZANIDINE HCL 4 MG PO TABS: 4.0000 mg | ORAL_TABLET | Freq: Every evening | ORAL | Status: DC | PRN

## 2021-07-23 MED ORDER — ALBUTEROL SULFATE HFA 108 (90 BASE) MCG/ACT IN AERS: 2.0000 | INHALATION_SPRAY | Freq: Four times a day (QID) | RESPIRATORY_TRACT | Status: DC | PRN

## 2021-07-23 MED ORDER — SODIUM CHLORIDE 0.9 % IV SOLN: 250.0000 mL | INTRAVENOUS | Status: DC

## 2021-07-23 MED ORDER — PHENYLEPHRINE 40 MCG/ML (10ML) SYRINGE FOR IV PUSH (FOR BLOOD PRESSURE SUPPORT)
PREFILLED_SYRINGE | INTRAVENOUS | Status: AC
  Filled 2021-07-23: qty 10

## 2021-07-23 MED ORDER — ROCURONIUM BROMIDE 10 MG/ML (PF) SYRINGE
PREFILLED_SYRINGE | INTRAVENOUS | Status: DC | PRN
  Administered 2021-07-23: 60 mg via INTRAVENOUS

## 2021-07-23 MED ORDER — DEXAMETHASONE SODIUM PHOSPHATE 10 MG/ML IJ SOLN
10.0000 mg | Freq: Once | INTRAMUSCULAR | Status: AC
  Administered 2021-07-23: 10 mg via INTRAVENOUS
  Filled 2021-07-23: qty 1

## 2021-07-23 MED ORDER — LIDOCAINE 2% (20 MG/ML) 5 ML SYRINGE
INTRAMUSCULAR | Status: DC | PRN
  Administered 2021-07-23: 60 mg via INTRAVENOUS

## 2021-07-23 MED ORDER — FENTANYL CITRATE (PF) 100 MCG/2ML IJ SOLN
25.0000 ug | INTRAMUSCULAR | Status: DC | PRN
  Administered 2021-07-23 (×3): 25 ug via INTRAVENOUS
  Administered 2021-07-23: 50 ug via INTRAVENOUS
  Administered 2021-07-23: 25 ug via INTRAVENOUS

## 2021-07-23 MED ORDER — IPRATROPIUM BROMIDE 0.02 % IN SOLN
0.5000 mg | Freq: Once | RESPIRATORY_TRACT | Status: AC
  Administered 2021-07-23: 0.5 mg via RESPIRATORY_TRACT

## 2021-07-23 MED ORDER — ONDANSETRON HCL 4 MG/2ML IJ SOLN
INTRAMUSCULAR | Status: DC | PRN
  Administered 2021-07-23: 4 mg via INTRAVENOUS

## 2021-07-23 MED ORDER — THROMBIN 5000 UNITS EX SOLR
CUTANEOUS | Status: AC
  Filled 2021-07-23: qty 15000

## 2021-07-23 MED ORDER — KETOROLAC TROMETHAMINE 30 MG/ML IJ SOLN
INTRAMUSCULAR | Status: AC
  Filled 2021-07-23: qty 1

## 2021-07-23 MED ORDER — LIDOCAINE 2% (20 MG/ML) 5 ML SYRINGE
INTRAMUSCULAR | Status: AC
  Filled 2021-07-23: qty 5

## 2021-07-23 MED ORDER — OXYCODONE HCL 5 MG/5ML PO SOLN
5.0000 mg | Freq: Once | ORAL | Status: DC | PRN
Start: 2021-07-23 — End: 2021-07-23

## 2021-07-23 MED ORDER — BUPROPION HCL ER (SR) 150 MG PO TB12
150.0000 mg | ORAL_TABLET | Freq: Two times a day (BID) | ORAL | Status: DC
  Administered 2021-07-23 – 2021-07-24 (×2): 150 mg via ORAL
  Filled 2021-07-23 (×5): qty 1

## 2021-07-23 MED ORDER — PROMETHAZINE HCL 25 MG/ML IJ SOLN: 6.2500 mg | INTRAMUSCULAR | Status: DC | PRN

## 2021-07-23 MED ORDER — DEXMEDETOMIDINE (PRECEDEX) IN NS 20 MCG/5ML (4 MCG/ML) IV SYRINGE
PREFILLED_SYRINGE | INTRAVENOUS | Status: DC | PRN
  Administered 2021-07-23 (×2): 8 ug via INTRAVENOUS

## 2021-07-23 MED ORDER — MENTHOL 3 MG MT LOZG: 1.0000 | LOZENGE | OROMUCOSAL | Status: DC | PRN

## 2021-07-23 MED ORDER — HYDROCODONE-ACETAMINOPHEN 5-325 MG PO TABS
1.0000 | ORAL_TABLET | ORAL | Status: DC | PRN
  Administered 2021-07-24 (×2): 1 via ORAL
  Filled 2021-07-23 (×2): qty 1

## 2021-07-23 MED ORDER — VANCOMYCIN HCL IN DEXTROSE 1-5 GM/200ML-% IV SOLN
1000.0000 mg | Freq: Once | INTRAVENOUS | Status: AC
  Administered 2021-07-23: 1000 mg via INTRAVENOUS
  Filled 2021-07-23: qty 200

## 2021-07-23 MED ORDER — PROPOFOL 10 MG/ML IV BOLUS
INTRAVENOUS | Status: AC
  Filled 2021-07-23: qty 20

## 2021-07-23 MED ORDER — ONDANSETRON HCL 4 MG/2ML IJ SOLN
INTRAMUSCULAR | Status: AC
  Filled 2021-07-23: qty 2

## 2021-07-23 MED ORDER — ONDANSETRON HCL 4 MG PO TABS: 4.0000 mg | ORAL_TABLET | Freq: Four times a day (QID) | ORAL | Status: DC | PRN

## 2021-07-23 MED ORDER — ACETAMINOPHEN 650 MG RE SUPP: 650.0000 mg | RECTAL | Status: DC | PRN

## 2021-07-23 MED ORDER — LACTATED RINGERS IV SOLN: INTRAVENOUS | Status: DC

## 2021-07-23 MED ORDER — SODIUM CHLORIDE 0.9% FLUSH
3.0000 mL | Freq: Two times a day (BID) | INTRAVENOUS | Status: DC
  Administered 2021-07-23: 3 mL via INTRAVENOUS

## 2021-07-23 MED ORDER — PROPOFOL 10 MG/ML IV BOLUS
INTRAVENOUS | Status: DC | PRN
  Administered 2021-07-23: 30 mg via INTRAVENOUS
  Administered 2021-07-23: 200 mg via INTRAVENOUS
  Administered 2021-07-23: 30 mg via INTRAVENOUS
  Administered 2021-07-23 (×2): 20 mg via INTRAVENOUS

## 2021-07-23 MED ORDER — HYDROXYZINE PAMOATE 25 MG PO CAPS
25.0000 mg | ORAL_CAPSULE | Freq: Four times a day (QID) | ORAL | Status: DC | PRN
  Filled 2021-07-23 (×3): qty 2

## 2021-07-23 MED ORDER — HYDROMORPHONE HCL 1 MG/ML IJ SOLN
1.0000 mg | INTRAMUSCULAR | Status: DC | PRN
  Administered 2021-07-23: 1 mg via INTRAVENOUS
  Filled 2021-07-23: qty 1

## 2021-07-23 MED ORDER — ACETAMINOPHEN 325 MG PO TABS: 650.0000 mg | ORAL_TABLET | ORAL | Status: DC | PRN

## 2021-07-23 MED ORDER — HYDROCODONE-ACETAMINOPHEN 10-325 MG PO TABS
1.0000 | ORAL_TABLET | ORAL | Status: DC | PRN
  Administered 2021-07-23 (×2): 2 via ORAL
  Filled 2021-07-23 (×2): qty 2

## 2021-07-23 MED ORDER — CYCLOBENZAPRINE HCL 10 MG PO TABS
10.0000 mg | ORAL_TABLET | Freq: Three times a day (TID) | ORAL | Status: DC | PRN
  Administered 2021-07-23 – 2021-07-24 (×4): 10 mg via ORAL
  Filled 2021-07-23 (×4): qty 1

## 2021-07-23 MED ORDER — DEXMEDETOMIDINE (PRECEDEX) IN NS 20 MCG/5ML (4 MCG/ML) IV SYRINGE
PREFILLED_SYRINGE | INTRAVENOUS | Status: AC
  Filled 2021-07-23: qty 5

## 2021-07-23 MED ORDER — POTASSIUM CHLORIDE ER 10 MEQ PO TBCR: 10.0000 meq | EXTENDED_RELEASE_TABLET | Freq: Every day | ORAL | Status: DC

## 2021-07-23 MED ORDER — LOSARTAN POTASSIUM 50 MG PO TABS
100.0000 mg | ORAL_TABLET | Freq: Every day | ORAL | Status: DC
  Administered 2021-07-23 – 2021-07-24 (×2): 100 mg via ORAL
  Filled 2021-07-23 (×2): qty 2

## 2021-07-23 MED ORDER — ORAL CARE MOUTH RINSE: 15.0000 mL | Freq: Once | OROMUCOSAL | Status: AC

## 2021-07-23 MED ORDER — THROMBIN 5000 UNITS EX SOLR
CUTANEOUS | Status: DC | PRN
  Administered 2021-07-23 (×2): 5000 [IU] via TOPICAL

## 2021-07-23 MED ORDER — OXYCODONE HCL 5 MG PO TABS: 5.0000 mg | ORAL_TABLET | Freq: Once | ORAL | Status: DC | PRN

## 2021-07-23 MED ORDER — KETOROLAC TROMETHAMINE 30 MG/ML IJ SOLN
30.0000 mg | Freq: Once | INTRAMUSCULAR | Status: AC | PRN
  Administered 2021-07-23: 30 mg via INTRAVENOUS

## 2021-07-23 MED ORDER — AMISULPRIDE (ANTIEMETIC) 5 MG/2ML IV SOLN: 10.0000 mg | Freq: Once | INTRAVENOUS | Status: DC | PRN

## 2021-07-23 MED ORDER — PHENYLEPHRINE 40 MCG/ML (10ML) SYRINGE FOR IV PUSH (FOR BLOOD PRESSURE SUPPORT)
PREFILLED_SYRINGE | INTRAVENOUS | Status: DC | PRN
  Administered 2021-07-23 (×2): 80 ug via INTRAVENOUS

## 2021-07-23 MED ORDER — CEFAZOLIN SODIUM-DEXTROSE 2-4 GM/100ML-% IV SOLN
2.0000 g | INTRAVENOUS | Status: AC
  Administered 2021-07-23: 2 g via INTRAVENOUS
  Filled 2021-07-23: qty 100

## 2021-07-23 MED ORDER — IPRATROPIUM BROMIDE 0.02 % IN SOLN
RESPIRATORY_TRACT | Status: AC
  Filled 2021-07-23: qty 2.5

## 2021-07-23 MED ORDER — PHENOL 1.4 % MT LIQD: 1.0000 | OROMUCOSAL | Status: DC | PRN

## 2021-07-23 MED ORDER — HEMOSTATIC AGENTS (NO CHARGE) OPTIME
TOPICAL | Status: DC | PRN
  Administered 2021-07-23: 1 via TOPICAL

## 2021-07-23 MED ORDER — CHLORHEXIDINE GLUCONATE 0.12 % MT SOLN
15.0000 mL | Freq: Once | OROMUCOSAL | Status: AC
  Administered 2021-07-23: 15 mL via OROMUCOSAL
  Filled 2021-07-23: qty 15

## 2021-07-23 SURGICAL SUPPLY — 54 items
BAG COUNTER SPONGE SURGICOUNT (BAG) ×2 IMPLANT
BAG DECANTER FOR FLEXI CONT (MISCELLANEOUS) ×2 IMPLANT
BAND RUBBER #18 3X1/16 STRL (MISCELLANEOUS) ×4 IMPLANT
BENZOIN TINCTURE PRP APPL 2/3 (GAUZE/BANDAGES/DRESSINGS) ×2 IMPLANT
BIT DRILL 13 (BIT) ×2 IMPLANT
BUR MATCHSTICK NEURO 3.0 LAGG (BURR) ×2 IMPLANT
CAGE PEEK 7X14X11 (Cage) ×2 IMPLANT
CANISTER SUCT 3000ML PPV (MISCELLANEOUS) ×2 IMPLANT
CARTRIDGE OIL MAESTRO DRILL (MISCELLANEOUS) ×1 IMPLANT
DIFFUSER DRILL AIR PNEUMATIC (MISCELLANEOUS) ×2 IMPLANT
DRAPE C-ARM 42X72 X-RAY (DRAPES) ×4 IMPLANT
DRAPE LAPAROTOMY 100X72 PEDS (DRAPES) ×2 IMPLANT
DRAPE MICROSCOPE LEICA (MISCELLANEOUS) ×2 IMPLANT
DRSG OPSITE POSTOP 3X4 (GAUZE/BANDAGES/DRESSINGS) ×2 IMPLANT
DURAPREP 6ML APPLICATOR 50/CS (WOUND CARE) ×2 IMPLANT
ELECT COATED BLADE 2.86 ST (ELECTRODE) ×2 IMPLANT
ELECT REM PT RETURN 9FT ADLT (ELECTROSURGICAL) ×2
ELECTRODE REM PT RTRN 9FT ADLT (ELECTROSURGICAL) ×1 IMPLANT
GAUZE 4X4 16PLY ~~LOC~~+RFID DBL (SPONGE) IMPLANT
GAUZE SPONGE 4X4 12PLY STRL (GAUZE/BANDAGES/DRESSINGS) ×2 IMPLANT
GLOVE EXAM NITRILE XL STR (GLOVE) IMPLANT
GLOVE SURG LTX SZ9 (GLOVE) ×2 IMPLANT
GLOVE SURG UNDER POLY LF SZ6.5 (GLOVE) ×8 IMPLANT
GOWN STRL REUS W/ TWL LRG LVL3 (GOWN DISPOSABLE) ×2 IMPLANT
GOWN STRL REUS W/ TWL XL LVL3 (GOWN DISPOSABLE) ×1 IMPLANT
GOWN STRL REUS W/TWL 2XL LVL3 (GOWN DISPOSABLE) IMPLANT
GOWN STRL REUS W/TWL LRG LVL3 (GOWN DISPOSABLE) ×2
GOWN STRL REUS W/TWL XL LVL3 (GOWN DISPOSABLE) ×1
HALTER HD/CHIN CERV TRACTION D (MISCELLANEOUS) ×2 IMPLANT
HEMOSTAT POWDER KIT SURGIFOAM (HEMOSTASIS) IMPLANT
HEMOSTAT SURGICEL 2X14 (HEMOSTASIS) IMPLANT
KIT BASIN OR (CUSTOM PROCEDURE TRAY) ×2 IMPLANT
KIT TURNOVER KIT B (KITS) ×2 IMPLANT
NEEDLE SPNL 20GX3.5 QUINCKE YW (NEEDLE) ×2 IMPLANT
NS IRRIG 1000ML POUR BTL (IV SOLUTION) ×2 IMPLANT
OIL CARTRIDGE MAESTRO DRILL (MISCELLANEOUS) ×2
PACK LAMINECTOMY NEURO (CUSTOM PROCEDURE TRAY) ×2 IMPLANT
PAD ARMBOARD 7.5X6 YLW CONV (MISCELLANEOUS) ×6 IMPLANT
PLATE ELITE 42MM (Plate) ×2 IMPLANT
SCREW ST 13X4XST VA NS SPNE (Screw) ×6 IMPLANT
SCREW ST VAR 4 ATL (Screw) ×7 IMPLANT
SPACER SPNL 11X14X7XPEEK CVD (Cage) ×2 IMPLANT
SPCR SPNL 11X14X7XPEEK CVD (Cage) ×2 IMPLANT
SPONGE INTESTINAL PEANUT (DISPOSABLE) ×2 IMPLANT
SPONGE SURGIFOAM ABS GEL SZ50 (HEMOSTASIS) ×2 IMPLANT
STRIP CLOSURE SKIN 1/2X4 (GAUZE/BANDAGES/DRESSINGS) ×2 IMPLANT
SUT VIC AB 3-0 SH 8-18 (SUTURE) ×2 IMPLANT
SUT VIC AB 4-0 RB1 18 (SUTURE) ×2 IMPLANT
TAPE CLOTH 4X10 WHT NS (GAUZE/BANDAGES/DRESSINGS) ×2 IMPLANT
TAPE CLOTH SURG 4X10 WHT LF (GAUZE/BANDAGES/DRESSINGS) ×2 IMPLANT
TOWEL GREEN STERILE (TOWEL DISPOSABLE) ×2 IMPLANT
TOWEL GREEN STERILE FF (TOWEL DISPOSABLE) ×2 IMPLANT
TRAP SPECIMEN MUCUS 40CC (MISCELLANEOUS) ×2 IMPLANT
WATER STERILE IRR 1000ML POUR (IV SOLUTION) ×2 IMPLANT

## 2021-07-23 NOTE — Transfer of Care (Signed)
Immediate Anesthesia Transfer of Care Note  Patient: Judy Leonard  Procedure(s) Performed: Anterior Cervical Discectomy and Fusion Cervical Four-Five/Five-Six  Patient Location: PACU  Anesthesia Type:General  Level of Consciousness: awake, oriented and patient cooperative  Airway & Oxygen Therapy: Patient Spontanous Breathing and Patient connected to face mask oxygen  Post-op Assessment: Report given to RN and Post -op Vital signs reviewed and stable  Post vital signs: Reviewed  Last Vitals:  Vitals Value Taken Time  BP 129/88 07/23/21 1008  Temp    Pulse 113 07/23/21 1014  Resp 22 07/23/21 1014  SpO2 93 % 07/23/21 1014  Vitals shown include unvalidated device data.  Last Pain:  Vitals:   07/23/21 7253  TempSrc:   PainSc: 2       Patients Stated Pain Goal: 2 (07/23/21 6644)  Complications: No notable events documented.

## 2021-07-23 NOTE — Brief Op Note (Signed)
07/23/2021  9:53 AM  PATIENT:  Judy Leonard  55 y.o. female  PRE-OPERATIVE DIAGNOSIS:  Stenosis - myelopathy  POST-OPERATIVE DIAGNOSIS:  Stenosis - myelopathy  PROCEDURE:  Procedure(s) with comments: Anterior Cervical Discectomy and Fusion Cervical Four-Five/Five-Six (N/A) - 3C  SURGEON:  Surgeon(s) and Role:    * Julio Sicks, MD - Primary  PHYSICIAN ASSISTANT:   ASSISTANTSMarland Mcalpine   ANESTHESIA:   general  EBL:  200 mL   BLOOD ADMINISTERED:none  DRAINS: none   LOCAL MEDICATIONS USED:  NONE  SPECIMEN:  No Specimen  DISPOSITION OF SPECIMEN:  N/A  COUNTS:  YES  TOURNIQUET:  * No tourniquets in log *  DICTATION: .Dragon Dictation  PLAN OF CARE: Admit for overnight observation  PATIENT DISPOSITION:  PACU - hemodynamically stable.   Delay start of Pharmacological VTE agent (>24hrs) due to surgical blood loss or risk of bleeding: yes

## 2021-07-23 NOTE — Op Note (Signed)
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  Asst. Surgeon: Doran Durand, NP  Anesthesia: General  Indication: 55 year old female with neck and left upper extremity pain numbness and some intermittent weakness.  Work-up demonstrates evidence of marked spondylosis with stenosis both centrally and neuroforaminal and left greater than right at C4-5 and C5-6.  Patient is failed conservative management presents now for two-level anterior cervical decompression and fusion in hopes improving her symptoms.  Operative note: After induction anesthesia, patient positioned supine with neck slightly extended held placed halter traction.  Patient's anterior cervical region prepped draped sterilely.  Incision made overlying C5.  Dissection performed on the right.  Retractor placed.  Fluoroscopy used.  Level confirmed.  Displaces C4-5 and C5-6 in size.  Discectomy was then performed using various instruments down to level of posterior annulus.  Microscope then brought to field used throughout remainder of the discectomies.  Remaining aspects of annulus and osteophytes removed using high-speed drill down to level of posterior logical ligament.  Posterior logical was elevated and resected piecemeal fashion underlying thecal sac was identified.  A wide central decompression then performed undercutting the bodies of C4 and 5.  Decompression proceeded each neural foramina.  Wide anterior foraminotomies performed on the course exiting C5 nerve roots bilaterally.  At this point a very thorough decompression been achieved.  There was no evidence of injury to thecal sac and nerve roots.   Procedure then repeated at C5-6 again without complication.  Wound was then irrigated.  7 mm [employer] anatomic peek cage was then packed with locally harvested autograft.  Each cage was then impacted in place and recessed slightly from the anterior cortical margin.  Atlantis anterior cervical plate was then placed over the C4, C5 and C6 levels.  This then attached with fluoroscopic guidance using 13 mm variable angle screws to each at all 3 levels.  All screws given final tightening found to be solidly within the bone.  Locking screws engaged at each level.  Final images reveal good position of the cages and the hardware at the proper operative level with normal alignment of spine.  Wound is then inspected for hemostasis.  Wound is then closed in layers with Vicryl sutures.  Steri-Strips and sterile dressing were applied.  No apparent complications.  Patient tolerated the procedure well and she returns to recovery room postop.

## 2021-07-23 NOTE — Progress Notes (Signed)
Orthopedic Tech Progress Note Patient Details:  Judy Leonard 30-Nov-1966 [id number]  Patient has COLLAR   Patient ID: Judy Leonard, female   DOB: [birth year], 55 y.o.   MRN: [id number]  [name] 07/23/2021, 10:29 AM

## 2021-07-23 NOTE — Progress Notes (Signed)
Patient's surgical PCR result positive for MRSA and Staphylococcus Aureus. Notified Dr. Jordan Likes. Verbal order received for Vancomycin 1 gram IV in addition to Ancef that is already ordered.

## 2021-07-23 NOTE — Anesthesia Procedure Notes (Signed)
Procedure Name: Intubation Date/Time: 07/23/2021 8:17 AM Performed by: Lovie Chol, CRNA Pre-anesthesia Checklist: Patient identified, Emergency Drugs available, Suction available and Patient being monitored Patient Re-evaluated:Patient Re-evaluated prior to induction Oxygen Delivery Method: Circle System Utilized Preoxygenation: Pre-oxygenation with 100% oxygen Induction Type: IV induction Ventilation: Mask ventilation without difficulty Laryngoscope Size: Glidescope and 3 Grade View: Grade I Tube type: Oral Tube size: 7.5 mm Number of attempts: 1 Airway Equipment and Method: Stylet, Oral airway and Video-laryngoscopy Placement Confirmation: ETT inserted through vocal cords under direct vision, positive ETCO2 and breath sounds checked- equal and bilateral Tube secured with: Tape Dental Injury: Teeth and Oropharynx as per pre-operative assessment  Comments: Elective video-glidescope for intubation.

## 2021-07-23 NOTE — Anesthesia Postprocedure Evaluation (Signed)
Anesthesia Post Note  Patient: Lenzi A. Schar  Procedure(s) Performed: Anterior Cervical Discectomy and Fusion Cervical Four-Five/Five-Six     Patient location during evaluation: PACU Anesthesia Type: General Level of consciousness: awake Pain management: pain level controlled Vital Signs Assessment: post-procedure vital signs reviewed and stable Respiratory status: spontaneous breathing, nonlabored ventilation, respiratory function stable and patient connected to nasal cannula oxygen Cardiovascular status: blood pressure returned to baseline and stable Postop Assessment: no apparent nausea or vomiting Anesthetic complications: no   No notable events documented.  Last Vitals:  Vitals:   07/23/21 1125 07/23/21 1147  BP: 115/84 (!) 128/91  Pulse: (!) 103 89  Resp: 20 18  Temp: 36.5 C 36.6 C  SpO2: 93% 93%    Last Pain:  Vitals:   07/23/21 1317  TempSrc:   PainSc: 8                  Woodroe Vogan P Bonny Vanleeuwen

## 2021-07-23 NOTE — H&P (Signed)
Judy Leonard is an 55 y.o. female.   Chief Complaint: Neck pain HPI: 55 year old female with neck and left upper extremity pain numbness and some paresthesias failing conservative management her work-up demonstrates evidence of significant cervical spondylosis with stenosis and cord compression at C4-5 and C5-6.  Patient presents now for two-level anterior cervical decompression and fusion in hopes of improving her symptoms.  Past Medical History:  Diagnosis Date   Arthritis    Asthma    Hypertension     Past Surgical History:  Procedure Laterality Date   APPENDECTOMY  age 46    Family History  Problem Relation Age of Onset   Diabetes Mother    Kidney disease Mother    Hypertension Mother    Heart failure Mother    Social History:  reports that she has been smoking cigarettes. She has a 7.75 pack-year smoking history. She has never used smokeless tobacco. She reports current alcohol use. She reports previous drug use.  Allergies: Not on File  Medications Prior to Admission  Medication Sig Dispense Refill   amLODipine (NORVASC) 10 MG tablet Take 1 tablet (10 mg total) by mouth daily. 30 tablet 1   aspirin-sod bicarb-citric acid (ALKA-SELTZER) 325 MG TBEF tablet Take 325 mg by mouth every 6 (six) hours as needed (heartburn).     benzonatate (TESSALON) 100 MG capsule Take by mouth 3 (three) times daily as needed for cough.     buPROPion (ZYBAN) 150 MG 12 hr tablet Take 150 mg by mouth 2 (two) times daily.     Fluticasone-Salmeterol (ADVAIR) 100-50 MCG/DOSE AEPB INHALE 1 PUFF BY MOUTH TWICE DAILY (EVERY 12 HOURS). RINSE MOUTH AFTER USE. - USE IN THE MORNING AND AT BEDTIME (Patient taking differently: Inhale 1 puff into the lungs in the morning and at bedtime.) 120 each 1   hydrOXYzine (VISTARIL) 25 MG capsule Take 25-50 mg by mouth every 6 (six) hours as needed for anxiety.     losartan (COZAAR) 100 MG tablet Take 1 tablet (100 mg total) by mouth daily. 30 tablet 1   PROVENTIL  HFA 108 (90 Base) MCG/ACT inhaler INHALE 2 PUFFS BY MOUTH EVERY 6 HOURS AS NEEDED FOR COUGHING, WHEEZING, OR SHORTNESS OF BREATH (Patient taking differently: Inhale 2 puffs into the lungs every 6 (six) hours as needed for wheezing or shortness of breath.) 13.4 g 1   tiZANidine (ZANAFLEX) 4 MG tablet One by mouth every night before bed as needed for spasm (Patient taking differently: Take 4 mg by mouth at bedtime as needed for muscle spasms.) 30 tablet 1   potassium chloride (KLOR-CON) 10 MEQ tablet Take 1 tablet (10 mEq total) by mouth daily for 14 days. 14 tablet 0    No results found for this or any previous visit (from the past 48 hour(s)). No results found.  Pertinent items noted in HPI and remainder of comprehensive ROS otherwise negative.  Blood pressure 135/87, pulse 99, temperature 98.3 F (36.8 C), temperature source Oral, resp. rate 18, height 5\' 6"  (1.676 m), weight 91.6 kg, last menstrual period 04/28/2015, SpO2 98 %.  Patient is awake and alert.  She is oriented and appropriate.  Speech is fluent.  Judgment insight are intact.  Cranial nerve function normal bilateral motor examination reveals some mild weakness of wrist extension and brachioradialis on the left side otherwise motor strength intact.  Sensory examination with decrease sensation pinprick and light touch on her left C6 dermatome.  Deep tender if is normal active.  No evidence  of long track signs.  Gait and posture normal.  Examination head ears eyes nose throat is unremarkable her chest and abdomen are benign.  Extremities are free from injury or deformity. Assessment/Plan C4-5, C5-6 spondylosis with stenosis and radiculopathy.  Plan C4-5, C5-6 anterior cervical discectomy with interbody fusion utilizing interbody cages, local harvested autograft, and anterior plate instrumentation.  Risks and benefits been explained.  Patient wishes to proceed.  Kathaleen Maser Carleta Woodrow 07/23/2021, 7:54 AM

## 2021-07-24 DIAGNOSIS — M4722 Other spondylosis with radiculopathy, cervical region: Secondary | ICD-10-CM | POA: Diagnosis not present

## 2021-07-24 MED ORDER — ALBUTEROL SULFATE HFA 108 (90 BASE) MCG/ACT IN AERS: 2.0000 | INHALATION_SPRAY | Freq: Four times a day (QID) | RESPIRATORY_TRACT | Status: DC | PRN

## 2021-07-24 MED ORDER — CYCLOBENZAPRINE HCL 10 MG PO TABS: 10.0000 mg | ORAL_TABLET | Freq: Three times a day (TID) | ORAL | 0 refills | Status: DC | PRN

## 2021-07-24 MED ORDER — TIZANIDINE HCL 4 MG PO TABS: 4.0000 mg | ORAL_TABLET | Freq: Every evening | ORAL | Status: AC | PRN

## 2021-07-24 MED ORDER — HYDROCODONE-ACETAMINOPHEN 5-325 MG PO TABS: 1.0000 | ORAL_TABLET | ORAL | 0 refills | Status: DC | PRN

## 2021-07-24 MED ORDER — FLUTICASONE-SALMETEROL 100-50 MCG/DOSE IN AEPB: 1.0000 | INHALATION_SPRAY | Freq: Two times a day (BID) | RESPIRATORY_TRACT | Status: DC

## 2021-07-24 NOTE — Discharge Instructions (Signed)
Wound Care °Keep incision covered and dry for two days.    °Do not put any creams, lotions, or ointments on incision. °Leave steri-strips on back.  They will fall off by themselves. °Activity °Walk each and every day, increasing distance each day. °No lifting greater than 5 lbs.  Avoid excessive neck motion. °No driving for 2 weeks; may ride as a passenger locally. °Diet °Resume your normal diet.  °Return to Work °Will be discussed at your follow up appointment. °Call Your Doctor If Any of These Occur °Redness, drainage, or swelling at the wound.  °Temperature greater than 101 degrees. °Severe pain not relieved by pain medication. °Incision starts to come apart. °Follow Up Appt °Call today for appointment in 1-2 weeks ([phone]) or for problems.  °

## 2021-07-24 NOTE — Progress Notes (Signed)
Patient was transported via wheelchair by NT for discharge home; in no acute distress nor complaints of pain nor discomfort; room was checked and accounted for all her belongings; discharge instructions given to patient by assigned RN and patient verbalized understanding on the instructions given.

## 2021-07-24 NOTE — Discharge Summary (Signed)
Physician Discharge Summary     Providing Compassionate, Quality Care - Together   Patient ID: Judy Leonard MRN: [id number] DOB/AGE: 56-Aug-1967 56 y.o.  Admit date: 07/23/2021 Discharge date: 07/24/2021  Admission Diagnoses: Cervical spondylosis with myelopathy and radiculopathy  Discharge Diagnoses:  Active Problems:   Cervical spondylosis with myelopathy and radiculopathy   Discharged Condition: good  Hospital Course: Patient underwent a C4-5, C5-6 ACDF by Dr. Jordan Likes on 07/23/2021. She was admitted to 3C07 following recovery from anesthesia in the PACU. Her postoperative course has been uncomplicated. She has worked with both physical and occupational therapies who feel the patient is ready for discharge home. She is ambulating independently and without difficulty. She is tolerating a normal diet. She is having no issues with swallowing or complaints of shortness of breath. Her incision is covered with a gauze dressing, which is clean, dry, and intact. She is not having any bowel or bladder dysfunction. Her pain is well-controlled with oral pain medication. She is ready for discharge home.    Consults: None  Significant Diagnostic Studies: radiology: DG Cervical Spine 2 or 3 views  Result Date: 07/23/2021 CLINICAL DATA:  Surgery, elective. Additional history provided by technologist: Anterior cervical discectomy and fusion cervical 4-5/5-6. Provided fluoroscopy time: 4.5 seconds (0.38 mGy). EXAM: CERVICAL SPINE - 2-3 VIEW; DG C-ARM 1-60 MIN COMPARISON:  Cervical spine MRI 08/01/2020. FINDINGS: Two lateral view intraoperative fluoroscopic images of the cervical spine are submitted. On the most recent provided image, ACDF hardware is present at the C4-C6 levels (ventral plate and screws, as well as interbody devices). Partially visualized ET tube. IMPRESSION: Two lateral view intraoperative fluoroscopic images of the cervical spine from C4-C6 ACDF, as described. Electronically Signed    By: Jackey Loge DO   On: 07/23/2021 10:51   DG C-Arm 1-60 Min  Result Date: 07/23/2021 CLINICAL DATA:  Surgery, elective. Additional history provided by technologist: Anterior cervical discectomy and fusion cervical 4-5/5-6. Provided fluoroscopy time: 4.5 seconds (0.38 mGy). EXAM: CERVICAL SPINE - 2-3 VIEW; DG C-ARM 1-60 MIN COMPARISON:  Cervical spine MRI 08/01/2020. FINDINGS: Two lateral view intraoperative fluoroscopic images of the cervical spine are submitted. On the most recent provided image, ACDF hardware is present at the C4-C6 levels (ventral plate and screws, as well as interbody devices). Partially visualized ET tube. IMPRESSION: Two lateral view intraoperative fluoroscopic images of the cervical spine from C4-C6 ACDF, as described. Electronically Signed   By: Jackey Loge DO   On: 07/23/2021 10:51     Treatments: surgery:  C4-5, C5-6 anterior cervical discectomy with interbody fusion utilizing interbody cages, local harvested autograft, and anterior plate instrumentation  Operative note: After induction anesthesia, patient positioned supine with neck slightly extended held placed halter traction.  Patient's anterior cervical region prepped draped sterilely.  Incision made overlying C5.  Dissection performed on the right.  Retractor placed.  Fluoroscopy used.  Level confirmed.  Displaces C4-5 and C5-6 in size.  Discectomy was then performed using various instruments down to level of posterior annulus.  Microscope then brought to field used throughout remainder of the discectomies.  Remaining aspects of annulus and osteophytes removed using high-speed drill down to level of posterior logical ligament.  Posterior logical was elevated and resected piecemeal fashion underlying thecal sac was identified.  A wide central decompression then performed undercutting the bodies of C4 and 5.  Decompression proceeded each neural foramina.  Wide anterior foraminotomies performed on the course exiting C5  nerve roots bilaterally.  At this point a  very thorough decompression been achieved.  There was no evidence of injury to thecal sac and nerve roots.  Procedure then repeated at C5-6 again without complication.  Wound was then irrigated.  7 mm [employer] anatomic peek cage was then packed with locally harvested autograft.  Each cage was then impacted in place and recessed slightly from the anterior cortical margin.  Atlantis anterior cervical plate was then placed over the C4, C5 and C6 levels.  This then attached with fluoroscopic guidance using 13 mm variable angle screws to each at all 3 levels.  All screws given final tightening found to be solidly within the bone.  Locking screws engaged at each level.  Final images reveal good position of the cages and the hardware at the proper operative level with normal alignment of spine.  Wound is then inspected for hemostasis.  Wound is then closed in layers with Vicryl sutures.  Steri-Strips and sterile dressing were applied.  No apparent complications.  Patient tolerated the procedure well and she returns to recovery room postop.  Discharge Exam: Blood pressure 130/79, pulse 97, temperature 97.6 F (36.4 C), temperature source Oral, resp. rate 18, height 5\' 6"  (1.676 m), weight 91.6 kg, last menstrual period 04/28/2015, SpO2 94 %.  Alert and oriented x 4 PERRLA CN II-XII grossly intact MAE, Strength and sensation intact Incision is covered with Honeycomb dressing and Steri Strips; Dressing is clean, dry, and intact   Disposition: Discharge disposition: 01-Home or Self Care        Allergies as of 07/24/2021   Not on File      Medication List     TAKE these medications    albuterol 108 (90 Base) MCG/ACT inhaler Commonly known as: Proventil HFA Inhale 2 puffs into the lungs every 6 (six) hours as needed for wheezing or shortness of breath.   amLODipine 10 MG tablet Commonly known as: NORVASC Take 1 tablet (10 mg total) by mouth daily.    aspirin-sod bicarb-citric acid 325 MG Tbef tablet Commonly known as: ALKA-SELTZER Take 325 mg by mouth every 6 (six) hours as needed (heartburn).   benzonatate 100 MG capsule Commonly known as: TESSALON Take by mouth 3 (three) times daily as needed for cough.   buPROPion 150 MG 12 hr tablet Commonly known as: ZYBAN Take 150 mg by mouth 2 (two) times daily.   cyclobenzaprine 10 MG tablet Commonly known as: FLEXERIL Take 1 tablet (10 mg total) by mouth 3 (three) times daily as needed for muscle spasms.   Fluticasone-Salmeterol 100-50 MCG/DOSE Aepb Commonly known as: ADVAIR Inhale 1 puff into the lungs in the morning and at bedtime.   HYDROcodone-acetaminophen 5-325 MG tablet Commonly known as: NORCO/VICODIN Take 1-2 tablets by mouth every 4 (four) hours as needed for moderate pain ((score 4 to 6)).   hydrOXYzine 25 MG capsule Commonly known as: VISTARIL Take 25-50 mg by mouth every 6 (six) hours as needed for anxiety.   losartan 100 MG tablet Commonly known as: COZAAR Take 1 tablet (100 mg total) by mouth daily.   potassium chloride 10 MEQ tablet Commonly known as: KLOR-CON Take 1 tablet (10 mEq total) by mouth daily for 14 days.   tiZANidine 4 MG tablet Commonly known as: Zanaflex Take 1 tablet (4 mg total) by mouth at bedtime as needed for muscle spasms.        Follow-up Information     07/26/2021, MD. Go on 08/02/2021.   Specialty: Neurosurgery Why: First post op appointment is 08/02/2021 at  3:15 pm. Contact information: [address] [place] [address] [phone]                 Signed: Val Eagle, DNP, AGNP-C Nurse Practitioner  [facility] [address], [place], [place] [address] P: [phone]    F: [phone]  07/24/2021, 10:32 AM

## 2021-07-24 NOTE — Evaluation (Signed)
Occupational Therapy Evaluation Patient Details Name: Judy Leonard MRN: [id number] DOB: 08/27/1966 Today's Date: 07/24/2021    History of Present Illness 55 yo female s/p C4-6 ACDF. PMH including HTN, asthma, and arthritis.   Clinical Impression   PTA, pt was living with her son (3 yo) and was independent; not currently working. Sister-in-law reports that she can assist as needed. Currently, pt requires Supervision for ADLs and functional mobility. Provided education on cervical precautions, collar management, bed mobility, grooming, UB ADLs, LB ADLs, toileting, and shower transfer with shower seat; pt demonstrated understanding. Answered all pt questions. Recommend dc home once medically stable per physician. All acute OT needs met and will sign off. Thank you.    Follow Up Recommendations  No OT follow up    Equipment Recommendations  None recommended by OT    Recommendations for Other Services       Precautions / Restrictions Precautions Precautions: Cervical Precaution Booklet Issued: Yes (comment) Precaution Comments: Providing handout and education on cervical precautions and compensatory techniques Required Braces or Orthoses: Cervical Brace Cervical Brace: Soft collar      Mobility Bed Mobility Overal bed mobility: Needs Assistance Bed Mobility: Sidelying to Sit   Sidelying to sit: Supervision       General bed mobility comments: supervision for safety. education on log roll technique    Transfers Overall transfer level: Needs assistance   Transfers: Sit to/from Stand Sit to Stand: Supervision         General transfer comment: Supervision for safety    Balance Overall balance assessment: Mild deficits observed, not formally tested                                         ADL either performed or assessed with clinical judgement   ADL Overall ADL's : Needs assistance/impaired                                        General ADL Comments: Pt performing ADLs and functional mobility at SUpervision level. Providing education and handout on cervical precautions, collar management, UB ADLs, LB ADLs, grooming, toileting, stair management, and shower transfer     Vision         Perception     Praxis      Pertinent Vitals/Pain Pain Assessment: Faces Faces Pain Scale: Hurts little more Pain Location: neck Pain Descriptors / Indicators: Grimacing;Discomfort Pain Intervention(s): Monitored during session;Repositioned     Hand Dominance Right   Extremity/Trunk Assessment Upper Extremity Assessment Upper Extremity Assessment: LUE deficits/detail LUE Deficits / Details: Reports numbness at L hand. WFL for ADLs   Lower Extremity Assessment Lower Extremity Assessment: Overall WFL for tasks assessed   Cervical / Trunk Assessment Cervical / Trunk Assessment: Normal   Communication Communication Communication: No difficulties   Cognition Arousal/Alertness: Awake/alert Behavior During Therapy: WFL for tasks assessed/performed Overall Cognitive Status: Within Functional Limits for tasks assessed                                     General Comments  Sister-in-law present throughout    Exercises     Shoulder Instructions      Home Living Family/patient expects to be discharged to:: Private residence  Living Arrangements: Children Available Help at Discharge: Family;Available PRN/intermittently Type of Home: House Home Access: Stairs to enter [employer] of Steps: 5 Entrance Stairs-Rails:  (Middle) Home Layout: One level     Bathroom Shower/Tub: Occupational psychologist: Standard     Home Equipment: Shower seat          Prior Functioning/Environment Level of Independence: Independent        Comments: ADLs, IADLs, driving. hasnt been working for last two years due to significant pain        OT Problem List: Decreased activity  tolerance;Decreased knowledge of precautions;Decreased knowledge of use of DME or AE;Pain      OT Treatment/Interventions:      OT Goals(Current goals can be found in the care plan section) Acute Rehab OT Goals Patient Stated Goal: Go home OT Goal Formulation: All assessment and education complete, DC therapy  OT Frequency:     Barriers to D/C:            Co-evaluation              AM-PAC OT "6 Clicks" Daily Activity     Outcome Measure Help from another person eating meals?: None Help from another person taking care of personal grooming?: None Help from another person toileting, which includes using toliet, bedpan, or urinal?: A Little Help from another person bathing (including washing, rinsing, drying)?: A Little Help from another person to put on and taking off regular upper body clothing?: A Little Help from another person to put on and taking off regular lower body clothing?: A Little 6 Click Score: 20   End of Session Equipment Utilized During Treatment: Cervical collar Nurse Communication: Mobility status  Activity Tolerance: Patient tolerated treatment well Patient left: in bed;with call bell/phone within reach;with family/visitor present  OT Visit Diagnosis: Other abnormalities of gait and mobility (R26.89);Muscle weakness (generalized) (M62.81);Pain Pain - part of body:  (neck)                Time: 0335-3317 OT Time Calculation (min): 17 min Charges:  OT General Charges $OT Visit: 1 Visit OT Evaluation $OT Eval Low Complexity: [place], OTR/L Acute Rehab Pager: [phone] Office: [place] 07/24/2021, 9:13 AM

## 2021-07-30 ENCOUNTER — Encounter (HOSPITAL_COMMUNITY): Payer: Self-pay | Admitting: Neurosurgery

## 2021-07-31 ENCOUNTER — Encounter (HOSPITAL_COMMUNITY): Payer: Self-pay | Admitting: Neurosurgery

## 2021-09-12 ENCOUNTER — Other Ambulatory Visit (HOSPITAL_COMMUNITY): Payer: Self-pay

## 2021-09-12 ENCOUNTER — Telehealth: Payer: Self-pay

## 2021-09-12 NOTE — Telephone Encounter (Signed)
[facility] Patient Product/process development scientist completed.    The patient is insured through [employer].  Medication will need a PA. [facility] is not contracted to fill specialty medications.  We will continue to follow to see if copay assistance is needed.  Clearance Coots, CPhT Specialty Pharmacy Patient [facility] for Infectious Disease Phone: [phone] Fax:  [phone]

## 2021-09-13 ENCOUNTER — Other Ambulatory Visit: Payer: Self-pay

## 2021-09-13 ENCOUNTER — Encounter: Payer: Self-pay | Admitting: Family

## 2021-09-13 ENCOUNTER — Ambulatory Visit (INDEPENDENT_AMBULATORY_CARE_PROVIDER_SITE_OTHER): Payer: Medicare Other | Admitting: Family

## 2021-09-13 VITALS — BP 138/97 | HR 101 | Temp 98.0°F | Wt 199.6 lb

## 2021-09-13 DIAGNOSIS — B182 Chronic viral hepatitis C: Secondary | ICD-10-CM

## 2021-09-13 NOTE — Progress Notes (Signed)
Subjective:    Patient ID: Judy Leonard, female    DOB: [birth year], 55 y.o.   MRN: [id number]  Chief Complaint  Patient presents with   New Patient (Initial Visit)    HPI:  Judy Leonard is a 55 y.o. female with previous medical history of asthma and hypertension presents today for evaluation of Hepatitis C.   Judy Leonard recently underwent cervical discectomy and fusion for spondylosis and stenosis and was incidentally found to have a positive Hepatitis C antibody and RNA level of 3.51 million. HIV testing was negative. This was the first she was informed of this diagnosis.  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  No personal history of liver disease.  Mom was diagnosed with hepatitis C and also cirrhosis, however she also have problems with alcohol.  No current symptoms and denies abdominal pain, nausea, vomiting, diarrhea, scleral icterus, fatigue, or jaundice.  Has not received treatment to date.  Drinks alcohol on occasion and smokes approximately 1 to 2 cigarettes/day with no recreational or illicit drug use. First diagnosed during surgery; Mom had hepatitis C and cirrhosis.  cocaine usage in the 80s; Not treated; no current symptoms;  No Known Allergies    Outpatient Medications Prior to Visit  Medication Sig Dispense Refill   albuterol (PROVENTIL HFA) 108 (90 Base) MCG/ACT inhaler Inhale 2 puffs into the lungs every 6 (six) hours as needed for wheezing or shortness of breath.     amLODipine (NORVASC) 10 MG tablet Take 1 tablet (10 mg total) by mouth daily. 30 tablet 1   aspirin-sod bicarb-citric acid (ALKA-SELTZER) 325 MG TBEF tablet Take 325 mg by mouth every 6 (six) hours as needed (heartburn).     benzonatate (TESSALON) 100 MG capsule Take by mouth 3 (three) times daily as needed for cough.     buPROPion (ZYBAN) 150 MG 12 hr tablet Take 150 mg by mouth 2 (two) times daily.     cyclobenzaprine (FLEXERIL) 10  MG tablet Take 1 tablet (10 mg total) by mouth 3 (three) times daily as needed for muscle spasms. 30 tablet 0   Fluticasone-Salmeterol (ADVAIR) 100-50 MCG/DOSE AEPB Inhale 1 puff into the lungs in the morning and at bedtime.     hydrOXYzine (VISTARIL) 25 MG capsule Take 25-50 mg by mouth every 6 (six) hours as needed for anxiety.     losartan (COZAAR) 100 MG tablet Take 1 tablet (100 mg total) by mouth daily. 30 tablet 1   tiZANidine (ZANAFLEX) 4 MG tablet Take 1 tablet (4 mg total) by mouth at bedtime as needed for muscle spasms.     traMADol (ULTRAM) 50 MG tablet Take 50 mg by mouth every 6 (six) hours as needed.     potassium chloride (KLOR-CON) 10 MEQ tablet Take 1 tablet (10 mEq total) by mouth daily for 14 days. 14 tablet 0   HYDROcodone-acetaminophen (NORCO/VICODIN) 5-325 MG tablet Take 1-2 tablets by mouth every 4 (four) hours as needed for moderate pain ((score 4 to 6)). (Patient not taking: Reported on 09/13/2021) 30 tablet 0   No facility-administered medications prior to visit.     Past Medical History:  Diagnosis Date   Arthritis    Asthma    Hypertension       Past Surgical History:  Procedure Laterality Date   ANTERIOR CERVICAL DECOMP/DISCECTOMY FUSION N/A 07/23/2021   Procedure: Anterior Cervical Discectomy and Fusion Cervical Four-Five/Five-Six;  Surgeon: Julio Sicks, MD;  Location: [facility] OR;  Service: Neurosurgery;  Laterality: N/A;  3C   APPENDECTOMY  age 9      Family History  Problem Relation Age of Onset   Diabetes Mother    Kidney disease Mother    Hypertension Mother    Heart failure Mother       Social History   Socioeconomic History   Marital status: Single    Spouse name: Not on file   Number of children: Not on file   Years of education: Not on file   Highest education level: Not on file  Occupational History   Not on file  Tobacco Use   Smoking status: Every Day    Packs/day: 0.25    Years: 31.00    Pack years: 7.75    Types: Cigarettes     Last attempt to quit: 04/29/2020    Years since quitting: 1.3   Smokeless tobacco: Never   Tobacco comments:    1-2 cig days  Vaping Use   Vaping Use: Never used  Substance and Sexual Activity   Alcohol use: Yes    Comment: occasionally   Drug use: Not Currently   Sexual activity: Not on file  Other Topics Concern   Not on file  Social History Narrative   Not on file   Social Determinants of Health   Financial Resource Strain: Not on file  Food Insecurity: Not on file  Transportation Needs: Not on file  Physical Activity: Not on file  Stress: Not on file  Social Connections: Not on file  Intimate Partner Violence: Not on file      Review of Systems  Constitutional:  Negative for chills, fatigue, fever and unexpected weight change.  Respiratory:  Negative for cough, chest tightness, shortness of breath and wheezing.   Cardiovascular:  Negative for chest pain and leg swelling.  Gastrointestinal:  Negative for abdominal distention, constipation, diarrhea, nausea and vomiting.  Neurological:  Negative for dizziness, weakness, light-headedness and headaches.  Hematological:  Does not bruise/bleed easily.      Objective:    BP (!) 138/97   Pulse (!) 101   Temp 98 F (36.7 C) (Oral)   Wt 199 lb 9.6 oz (90.5 kg)   LMP 04/28/2015   SpO2 97%   BMI 32.22 kg/m  Nursing note and vital signs reviewed.  Physical Exam Constitutional:      General: She is not in acute distress.    Appearance: She is well-developed.  Cardiovascular:     Rate and Rhythm: Normal rate and regular rhythm.     Heart sounds: Normal heart sounds. No murmur heard.   No friction rub. No gallop.  Pulmonary:     Effort: Pulmonary effort is normal. No respiratory distress.     Breath sounds: Normal breath sounds. No wheezing or rales.  Chest:     Chest wall: No tenderness.  Abdominal:     General: Bowel sounds are normal. There is no distension.     Palpations: Abdomen is soft. There is no  mass.     Tenderness: There is no abdominal tenderness. There is no guarding or rebound.  Skin:    General: Skin is warm and dry.  Neurological:     Mental Status: She is alert and oriented to person, place, and time.  Psychiatric:        Behavior: Behavior normal.        Thought Content: Thought content normal.        Judgment: Judgment  normal.        Assessment & Plan:   Patient Active Problem List   Diagnosis Date Noted   Chronic hepatitis C without hepatic coma (HCC) 09/14/2021   Cervical spondylosis with myelopathy and radiculopathy 07/23/2021   Chronic obstructive pulmonary disease (HCC) 03/01/2021   Essential hypertension 01/03/2021   Tobacco use disorder 04/20/2019     Problem List Items Addressed This Visit       Digestive   Chronic hepatitis C without hepatic coma (HCC) - Primary    Judy Leonard is a 55 year old African-American female with chronic hepatitis C with risk factors including history of cocaine use in the 1980s and mom positive for hepatitis C. initial viral load of 3.51 million.  Currently asymptomatic and treatment nave.  We reviewed the basics of hepatitis C including transmission, risks of left untreated, treatment options, financial resources, and plan of care.  Check lab work today including hepatitis C genotype, fibrosis score, and hepatitis B status.  Previous lab work negative for HIV.  Medication choice pending blood work results.  Plan for follow-up 1 month after starting medication.      Relevant Orders   Hepatitis C genotype   Hepatic function panel (Completed)   Liver Fibrosis, FibroTest-ActiTest   Protime-INR (Completed)   Hepatitis B surface antibody,qualitative   Hepatitis B surface antigen     I have discontinued Judy Leonard's HYDROcodone-acetaminophen. I am also having her maintain her benzonatate, potassium chloride, amLODipine, losartan, buPROPion, hydrOXYzine, aspirin-sod bicarb-citric acid, Fluticasone-Salmeterol,  albuterol, tiZANidine, cyclobenzaprine, and traMADol.     Follow-up: 1 month after starting medication or sooner if needed.    Marcos Eke, MSN, FNP-C Nurse Practitioner [facility] for Infectious Disease [facility] Main number: [phone]

## 2021-09-13 NOTE — Patient Instructions (Signed)
Nice to meet you.  We will check your lab work today and let you know the results.  Plan for follow up 1 month after starting medication.  Let us know if you have any questions.  Have a great day and stay safe!  Limit acetaminophen (Tylenol) usage to no more than 2 grams (2,000 mg) per day.  Avoid alcohol.  Do not share toothbrushes or razors.  Practice safe sex to protect against transmission as well as sexually transmitted disease.    Hepatitis C Hepatitis C is a viral infection of the liver. It can lead to scarring of the liver (cirrhosis), liver failure, or liver cancer. Hepatitis C may go undetected for months or years because people with the infection may not have symptoms, or they may have only mild symptoms. What are the causes? This condition is caused by the hepatitis C virus (HCV). The virus can spread from person to person (is contagious) through: Blood. Childbirth. A woman who has hepatitis C can pass it to her baby during birth. Bodily fluids, such as breast milk, tears, semen, vaginal fluids, and saliva. Blood transfusions or organ transplants done in the [place] before 1992.  What increases the risk? The following factors may make you more likely to develop this condition: Having contact with unclean (contaminated) needles or syringes. This may result from: Acupuncture. Tattoing. Body piercing. Injecting drugs. Having unprotected sex with someone who is infected. Needing treatment to filter your blood (kidney dialysis). Having HIV (human immunodeficiency virus) or AIDS (acquired immunodeficiency syndrome). Working in a job that involves contact with blood or bodily fluids, such as health care.  What are the signs or symptoms? Symptoms of this condition include: Fatigue. Loss of appetite. Nausea. Vomiting. Abdominal pain. Dark yellow urine. Yellowish skin and eyes (jaundice). Itchy skin. Clay-colored bowel movements. Joint pain. Bleeding and  bruising easily. Fluid building up in your stomach (ascites).  In some cases, you may not have any symptoms. How is this diagnosed? This condition is diagnosed with: Blood tests. Other tests to check how well your liver is functioning. They may include: Magnetic resonance elastography (MRE). This imaging test uses MRIs and sound waves to measure liver stiffness. Transient elastography. This imaging test uses ultrasounds to measure liver stiffness. Liver biopsy. This test requires taking a small tissue sample from your liver to examine it under a microscope.  How is this treated? Your health care provider may perform noninvasive tests or a liver biopsy to help decide the best course of treatment. Treatment may include: Antiviral medicines and other medicines. Follow-up treatments every 6-12 months for infections or other liver conditions. Receiving a donated liver (liver transplant).  Follow these instructions at home: Medicines Take over-the-counter and prescription medicines only as told by your health care provider. Take your antiviral medicine as told by your health care provider. Do not stop taking the antiviral even if you start to feel better. Do not take any medicines unless approved by your health care provider, including over-the-counter medicines and birth control pills. Activity Rest as needed. Do not have sex unless approved by your health care provider. Ask your health care provider when you may return to school or work. Eating and drinking Eat a balanced diet with plenty of fruits and vegetables, whole grains, and lowfat (lean) meats or non-meat proteins (such as beans or tofu). Drink enough fluids to keep your urine clear or pale yellow. Do not drink alcohol. General instructions Do not share toothbrushes, nail clippers, or razors. Wash  your hands frequently with soap and water. If soap and water are not available, use hand sanitizer. Cover any cuts or open sores on  your skin to prevent spreading the virus. Keep all follow-up visits as told by your health care provider. This is important. You may need follow-up visits every 6-12 months. How is this prevented? There is no vaccine for hepatitis C. The only way to prevent the disease is to reduce the risk of exposure to the virus. Make sure you: Wash your hands frequently with soap and water. If soap and water are not available, use hand sanitizer. Do not share needles or syringes. Practice safe sex and use condoms. Avoid handling blood or bodily fluids without gloves or other protection. Avoid getting tattoos or piercings in shops or other locations that are not clean.  Contact a health care provider if: You have a fever. You develop abdominal pain. You pass dark urine. You pass clay-colored stools. You develop joint pain. Get help right away if: You have increasing fatigue or weakness. You lose your appetite. You cannot eat or drink without vomiting. You develop jaundice or your jaundice gets worse. You bruise or bleed easily. Summary Hepatitis C is a viral infection of the liver. It can lead to scarring of the liver (cirrhosis), liver failure, or liver cancer. The hepatitis C virus (HCV) causes this condition. The virus can pass from person to person (is contagious). You should not take any medicines unless approved by your health care provider. This includes over-the-counter medicines and birth control pills. This information is not intended to replace advice given to you by your health care provider. Make sure you discuss any questions you have with your health care provider. Document Released: 12/13/2000 Document Revised: 01/21/2017 Document Reviewed: 01/21/2017 Elsevier Interactive Patient Education  [employer].

## 2021-09-14 ENCOUNTER — Encounter: Payer: Self-pay | Admitting: Family

## 2021-09-14 DIAGNOSIS — B182 Chronic viral hepatitis C: Secondary | ICD-10-CM | POA: Insufficient documentation

## 2021-09-14 NOTE — Assessment & Plan Note (Addendum)
Judy Leonard is a 55 year old African-American female with chronic hepatitis C with risk factors including history of cocaine use in the 1980s and mom positive for hepatitis C. initial viral load of 3.51 million.  Currently asymptomatic and treatment nave.  We reviewed the basics of hepatitis C including transmission, risks of left untreated, treatment options, financial resources, and plan of care.  Check lab work today including hepatitis C genotype, fibrosis score, and hepatitis B status.  Previous lab work negative for HIV.  Medication choice pending blood work results.  Plan for follow-up 1 month after starting medication.

## 2021-09-21 LAB — LIVER FIBROSIS, FIBROTEST-ACTITEST
ALT: 25 U/L (ref 6–29)
Alpha-2-Macroglobulin: 221 mg/dL (ref 106–279)
Apolipoprotein A1: 158 mg/dL (ref 101–198)
Bilirubin: 0.6 mg/dL (ref 0.2–1.2)
Fibrosis Score: 0.4
GGT: 148 U/L — ABNORMAL HIGH (ref 3–70)
Haptoglobin: 110 mg/dL (ref 43–212)
Necroinflammat ACT Score: 0.13
Reference ID: 4035480

## 2021-09-21 LAB — HEPATIC FUNCTION PANEL
AG Ratio: 1.3 (calc) (ref 1.0–2.5)
ALT: 24 U/L (ref 6–29)
AST: 30 U/L (ref 10–35)
Albumin: 4.3 g/dL (ref 3.6–5.1)
Alkaline phosphatase (APISO): 128 U/L (ref 37–153)
Bilirubin, Direct: 0.1 mg/dL (ref 0.0–0.2)
Globulin: 3.2 g/dL (calc) (ref 1.9–3.7)
Indirect Bilirubin: 0.6 mg/dL (calc) (ref 0.2–1.2)
Total Bilirubin: 0.7 mg/dL (ref 0.2–1.2)
Total Protein: 7.5 g/dL (ref 6.1–8.1)

## 2021-09-21 LAB — HEPATITIS C GENOTYPE

## 2021-09-21 LAB — PROTIME-INR
INR: 1
Prothrombin Time: 9.8 s (ref 9.0–11.5)

## 2021-09-21 LAB — HEPATITIS B SURFACE ANTIBODY,QUALITATIVE: Hep B S Ab: NONREACTIVE

## 2021-09-21 LAB — HEPATITIS B SURFACE ANTIGEN: Hepatitis B Surface Ag: NONREACTIVE

## 2021-09-24 ENCOUNTER — Other Ambulatory Visit (HOSPITAL_COMMUNITY): Payer: Self-pay

## 2021-09-24 ENCOUNTER — Telehealth: Payer: Self-pay

## 2021-09-24 NOTE — Telephone Encounter (Signed)
[facility] Patient Advocate Encounter   Received notification from [employer] that prior authorization for Mavyret is required.   PA submitted on 09/24/21 Key [id number] Status is pending    [facility] will continue to follow.   Clearance Coots, CPhT Specialty Pharmacy Patient [facility] Phone: [phone] Fax:  [phone]

## 2021-09-24 NOTE — Progress Notes (Signed)
I will start a PA for Mavyret 8 weeks

## 2021-09-25 ENCOUNTER — Telehealth: Payer: Self-pay

## 2021-09-25 ENCOUNTER — Other Ambulatory Visit (HOSPITAL_COMMUNITY): Payer: Self-pay

## 2021-09-25 NOTE — Telephone Encounter (Signed)
[facility] Patient Advocate Encounter  Prior Authorization for Mavyret has been approved.    PA# [id number] Effective dates: 08/22/21 through 09/24/22  Prescription will need to be filled through [facility] # [phone]. Once script is sent I will contact pharmacy for a copay.  [facility] will continue to follow.  Clearance Coots, CPhT Specialty Pharmacy Patient [facility] Phone: [phone] Fax:  [phone]

## 2021-09-25 NOTE — Telephone Encounter (Signed)
Patient is approved to receive Mavyret x 8 weeks for chronic Hepatitis C infection. Counseled patient to take all three tablets of Mavyret daily with food.  Counseled patient the need to take all three tablets together and to not separate them out during the day. Encouraged patient not to miss any doses and explained how their chance of cure could go down with each dose missed. Counseled patient on what to do if dose is missed - if it is closer to the missed dose take immediately; if closer to next dose then skip dose and take the next dose at the usual time. Counseled patient on common side effects such as headache, fatigue, and nausea and that these normally decrease with time. I reviewed patient medications and found no drug interactions. Discussed with patient that there are several drug interactions with Mavyret and instructed patient to call the clinic if she wishes to start a new medication during course of therapy. Also advised patient to call if she experiences any side effects. Patient will follow-up with me in the pharmacy clinic. Lupita Leash to schedule appointments in the next 1-2 weeks.   Shirlee More, PharmD PGY2 Infectious Diseases Pharmacy Resident

## 2021-10-02 ENCOUNTER — Other Ambulatory Visit: Payer: Self-pay | Admitting: Pharmacist

## 2021-10-02 DIAGNOSIS — B182 Chronic viral hepatitis C: Secondary | ICD-10-CM

## 2021-10-02 MED ORDER — MAVYRET 100-40 MG PO TABS: 3.0000 | ORAL_TABLET | Freq: Every day | ORAL | 1 refills | Status: DC

## 2021-11-16 ENCOUNTER — Ambulatory Visit (INDEPENDENT_AMBULATORY_CARE_PROVIDER_SITE_OTHER): Payer: Medicare Other | Admitting: Pharmacist

## 2021-11-16 ENCOUNTER — Other Ambulatory Visit: Payer: Self-pay

## 2021-11-16 DIAGNOSIS — B182 Chronic viral hepatitis C: Secondary | ICD-10-CM

## 2021-11-16 NOTE — Progress Notes (Signed)
11/16/2021  HPI: Judy Leonard is a 55 y.o. female who presents to the [facility] pharmacy clinic for Hepatitis C follow-up.  Medication: Mavyret x8 weeks  Start Date: 10/16/21  Hepatitis C Genotype: 1a  Fibrosis Score: F1-F2  Hepatitis C RNA: 3,510,000 on 08/08/21  Patient Active Problem List   Diagnosis Date Noted   Chronic hepatitis C without hepatic coma (HCC) 09/14/2021   Cervical spondylosis with myelopathy and radiculopathy 07/23/2021   Chronic obstructive pulmonary disease (HCC) 03/01/2021   Essential hypertension 01/03/2021   Tobacco use disorder 04/20/2019    Patient's Medications  New Prescriptions   No medications on file  Previous Medications   ALBUTEROL (PROVENTIL HFA) 108 (90 BASE) MCG/ACT INHALER    Inhale 2 puffs into the lungs every 6 (six) hours as needed for wheezing or shortness of breath.   AMLODIPINE (NORVASC) 10 MG TABLET    Take 1 tablet (10 mg total) by mouth daily.   ASPIRIN-SOD BICARB-CITRIC ACID (ALKA-SELTZER) 325 MG TBEF TABLET    Take 325 mg by mouth every 6 (six) hours as needed (heartburn).   BENZONATATE (TESSALON) 100 MG CAPSULE    Take by mouth 3 (three) times daily as needed for cough.   BUPROPION (ZYBAN) 150 MG 12 HR TABLET    Take 150 mg by mouth 2 (two) times daily.   CYCLOBENZAPRINE (FLEXERIL) 10 MG TABLET    Take 1 tablet (10 mg total) by mouth 3 (three) times daily as needed for muscle spasms.   FLUTICASONE-SALMETEROL (ADVAIR) 100-50 MCG/DOSE AEPB    Inhale 1 puff into the lungs in the morning and at bedtime.   GLECAPREVIR-PIBRENTASVIR (MAVYRET) 100-40 MG TABS    Take 3 tablets by mouth daily with breakfast.   HYDROXYZINE (VISTARIL) 25 MG CAPSULE    Take 25-50 mg by mouth every 6 (six) hours as needed for anxiety.   LOSARTAN (COZAAR) 100 MG TABLET    Take 1 tablet (100 mg total) by mouth daily.   POTASSIUM CHLORIDE (KLOR-CON) 10 MEQ TABLET    Take 1 tablet (10 mEq total) by mouth daily for 14 days.   TIZANIDINE (ZANAFLEX) 4 MG TABLET     Take 1 tablet (4 mg total) by mouth at bedtime as needed for muscle spasms.   TRAMADOL (ULTRAM) 50 MG TABLET    Take 50 mg by mouth every 6 (six) hours as needed.  Modified Medications   No medications on file  Discontinued Medications   No medications on file    Allergies: No Known Allergies  Past Medical History: Past Medical History:  Diagnosis Date   Arthritis    Asthma    Hypertension     Social History: Social History   Socioeconomic History   Marital status: Single    Spouse name: Not on file   Number of children: Not on file   Years of education: Not on file   Highest education level: Not on file  Occupational History   Not on file  Tobacco Use   Smoking status: Every Day    Packs/day: 0.25    Years: 31.00    Pack years: 7.75    Types: Cigarettes    Last attempt to quit: 04/29/2020    Years since quitting: 1.5   Smokeless tobacco: Never   Tobacco comments:    1-2 cig days  Vaping Use   Vaping Use: Never used  Substance and Sexual Activity   Alcohol use: Yes    Comment: occasionally   Drug use:  Not Currently   Sexual activity: Not on file  Other Topics Concern   Not on file  Social History Narrative   Not on file   Social Determinants of Health   Financial Resource Strain: Not on file  Food Insecurity: Not on file  Transportation Needs: Not on file  Physical Activity: Not on file  Stress: Not on file  Social Connections: Not on file    Labs: Hepatitis C Lab Results  Component Value Date   HCVGENOTYPE 1a 09/13/2021   FIBROSTAGE F1-F2 09/13/2021   Hepatitis B Lab Results  Component Value Date   HEPBSAB NON-REACTIVE 09/13/2021   HEPBSAG NON-REACTIVE 09/13/2021   Hepatitis A No results found for: HAV HIV No results found for: HIV Lab Results  Component Value Date   CREATININE 1.07 (H) 07/19/2021   CREATININE 0.98 02/26/2021   CREATININE 0.62 10/02/2020   CREATININE 0.62 11/10/2019   CREATININE 0.73 06/23/2019   Lab Results   Component Value Date   AST 30 09/13/2021   AST 74 (H) 10/02/2020   AST 58 (H) 11/10/2019   ALT 24 09/13/2021   ALT 25 09/13/2021   ALT 56 (H) 10/02/2020   INR 1.0 09/13/2021    Assessment: Judy Leonard presents to clinic today for her 14-month Hepatitis C follow-up visit. She is taking Mavyret x8 weeks and started on 10/16/21. She has not had any missed doses since beginning treatment. I congratulated her on her perfect adherence and counseled to keep up the compliance throughout the next month for best outcomes. She does report some headaches and upset stomach . She tries to take with food, but does not always have an appetite. She had trouble sleeping in the beginning but that has now subsided. She received he last refill last week. She has not started taking any new medications. All questions and concerns addressed at her visit today.   Plan: Check HCV RNA Follow-up on 12/18/21 @ 10:00 with Marchelle Folks for EOT visit  Shirlee More, PharmD PGY2 Infectious Diseases Pharmacy Resident

## 2021-11-19 LAB — HEPATITIS C RNA QUANTITATIVE
HCV Quantitative Log: <1.18 log IU/mL
HCV RNA, PCR, QN: <15 IU/mL

## 2021-12-18 ENCOUNTER — Ambulatory Visit (INDEPENDENT_AMBULATORY_CARE_PROVIDER_SITE_OTHER): Payer: Medicare Other | Admitting: Pharmacist

## 2021-12-18 ENCOUNTER — Other Ambulatory Visit: Payer: Self-pay

## 2021-12-18 DIAGNOSIS — B182 Chronic viral hepatitis C: Secondary | ICD-10-CM

## 2021-12-18 NOTE — Progress Notes (Signed)
HPI: Judy Leonard is a 55 y.o. female who presents to the [facility] pharmacy clinic for Hepatitis C follow-up.  Medication: Mavyret x 8 weeks  Start Date: 10/16/21  Hepatitis C Genotype: 1a  Fibrosis Score: F1/F2  Hepatitis C RNA: (9/15) 3.51 million > (11/18) undetectable   Patient Active Problem List   Diagnosis Date Noted   Chronic hepatitis C without hepatic coma (HCC) 09/14/2021   Cervical spondylosis with myelopathy and radiculopathy 07/23/2021   Chronic obstructive pulmonary disease (HCC) 03/01/2021   Essential hypertension 01/03/2021   Tobacco use disorder 04/20/2019    Patient's Medications  New Prescriptions   No medications on file  Previous Medications   ALBUTEROL (PROVENTIL HFA) 108 (90 BASE) MCG/ACT INHALER    Inhale 2 puffs into the lungs every 6 (six) hours as needed for wheezing or shortness of breath.   AMLODIPINE (NORVASC) 10 MG TABLET    Take 1 tablet (10 mg total) by mouth daily.   ASPIRIN-SOD BICARB-CITRIC ACID (ALKA-SELTZER) 325 MG TBEF TABLET    Take 325 mg by mouth every 6 (six) hours as needed (heartburn).   BENZONATATE (TESSALON) 100 MG CAPSULE    Take by mouth 3 (three) times daily as needed for cough.   BUPROPION (ZYBAN) 150 MG 12 HR TABLET    Take 150 mg by mouth 2 (two) times daily.   CYCLOBENZAPRINE (FLEXERIL) 10 MG TABLET    Take 1 tablet (10 mg total) by mouth 3 (three) times daily as needed for muscle spasms.   FLUTICASONE-SALMETEROL (ADVAIR) 100-50 MCG/DOSE AEPB    Inhale 1 puff into the lungs in the morning and at bedtime.   GLECAPREVIR-PIBRENTASVIR (MAVYRET) 100-40 MG TABS    Take 3 tablets by mouth daily with breakfast.   HYDROXYZINE (VISTARIL) 25 MG CAPSULE    Take 25-50 mg by mouth every 6 (six) hours as needed for anxiety.   LOSARTAN (COZAAR) 100 MG TABLET    Take 1 tablet (100 mg total) by mouth daily.   POTASSIUM CHLORIDE (KLOR-CON) 10 MEQ TABLET    Take 1 tablet (10 mEq total) by mouth daily for 14 days.   TIZANIDINE (ZANAFLEX) 4  MG TABLET    Take 1 tablet (4 mg total) by mouth at bedtime as needed for muscle spasms.   TRAMADOL (ULTRAM) 50 MG TABLET    Take 50 mg by mouth every 6 (six) hours as needed.  Modified Medications   No medications on file  Discontinued Medications   No medications on file    Allergies: No Known Allergies  Past Medical History: Past Medical History:  Diagnosis Date   Arthritis    Asthma    Hypertension     Social History: Social History   Socioeconomic History   Marital status: Single    Spouse name: Not on file   Number of children: Not on file   Years of education: Not on file   Highest education level: Not on file  Occupational History   Not on file  Tobacco Use   Smoking status: Every Day    Packs/day: 0.25    Years: 31.00    Pack years: 7.75    Types: Cigarettes    Last attempt to quit: 04/29/2020    Years since quitting: 1.6   Smokeless tobacco: Never   Tobacco comments:    1-2 cig days  Vaping Use   Vaping Use: Never used  Substance and Sexual Activity   Alcohol use: Yes    Comment: occasionally  Drug use: Not Currently   Sexual activity: Not on file  Other Topics Concern   Not on file  Social History Narrative   Not on file   Social Determinants of Health   Financial Resource Strain: Not on file  Food Insecurity: Not on file  Transportation Needs: Not on file  Physical Activity: Not on file  Stress: Not on file  Social Connections: Not on file    Labs: Hepatitis C Lab Results  Component Value Date   HCVGENOTYPE 1a 09/13/2021   HCVRNAPCRQN <15 11/16/2021   FIBROSTAGE F1-F2 09/13/2021   Hepatitis B Lab Results  Component Value Date   HEPBSAB NON-REACTIVE 09/13/2021   HEPBSAG NON-REACTIVE 09/13/2021   Hepatitis A No results found for: HAV HIV No results found for: HIV Lab Results  Component Value Date   CREATININE 1.07 (H) 07/19/2021   CREATININE 0.98 02/26/2021   CREATININE 0.62 10/02/2020   CREATININE 0.62 11/10/2019    CREATININE 0.73 06/23/2019   Lab Results  Component Value Date   AST 30 09/13/2021   AST 74 (H) 10/02/2020   AST 58 (H) 11/10/2019   ALT 24 09/13/2021   ALT 25 09/13/2021   ALT 56 (H) 10/02/2020   INR 1.0 09/13/2021    Assessment: Judy Leonard presents to clinic today for HCV follow-up. She completed 8 weeks of Mavyret last Tuesday and did not miss any doses. She stated she started experiencing nausea, vomiting, and loss of appetite in the last 2 weeks of her treatment. States she always took Building surveyor with food around 8pm. She stated these symptoms have improved slightly since last week. Counseled for her to call if these symptoms worsen over the next several weeks but that they should hopefully continue to improve as she remains off the medicine. Will check HCV RNA today and follow-up with Tammy Sours in 3 months.   Plan: Check HCV RNA Follow-up with Tammy Sours on 03/18/22 for her cure visit   Margarite Gouge, PharmD, CPP Clinical Pharmacist Practitioner Infectious Diseases Clinical Pharmacist [facility] 12/18/2021, 10:32 AM

## 2021-12-21 LAB — HEPATITIS C RNA QUANTITATIVE
HCV Quantitative Log: <1.18 log IU/mL
HCV RNA, PCR, QN: <15 IU/mL

## 2022-03-18 ENCOUNTER — Other Ambulatory Visit: Payer: Self-pay

## 2022-03-18 ENCOUNTER — Encounter: Payer: Self-pay | Admitting: Family

## 2022-03-18 ENCOUNTER — Ambulatory Visit (INDEPENDENT_AMBULATORY_CARE_PROVIDER_SITE_OTHER): Payer: Medicare Other | Admitting: Family

## 2022-03-18 VITALS — BP 108/75 | HR 92 | Temp 97.9°F | Ht 66.0 in | Wt 193.0 lb

## 2022-03-18 DIAGNOSIS — B182 Chronic viral hepatitis C: Secondary | ICD-10-CM | POA: Diagnosis not present

## 2022-03-18 NOTE — Progress Notes (Signed)
? ?Subjective:  ? ? Patient ID: Judy Leonard, female    DOB: Dec 07, 1966, 56 y.o.   MRN: [id number] ? ?Chief Complaint  ?Patient presents with  ? Follow-up  ? ? ?HPI: ? ?Judy Leonard is a 56 y.o. female with chronic hepatitis C was last seen on 12/18/21 by Margarite Gouge, PharmD, CPP for end of treatment of 8 weeks of Mavyret. Hepatitis C RNA level was undetectable. Here today for follow up and SVR 12.  ? ?Judy Leonard has been doing well since finishing her medication with occasional abdominal pain recently and is scheduled for a colonoscopy in April. Otherwise no new nausea, vomiting, diarrhea, scleral icterus or jaundice. Has questions about the best way she can keep her liver healthy.  ? ? ?No Known Allergies ? ? ? ?Outpatient Medications Prior to Visit  ?Medication Sig Dispense Refill  ? albuterol (PROVENTIL HFA) 108 (90 Base) MCG/ACT inhaler Inhale 2 puffs into the lungs every 6 (six) hours as needed for wheezing or shortness of breath.    ? amLODipine (NORVASC) 10 MG tablet Take 1 tablet (10 mg total) by mouth daily. 30 tablet 1  ? aspirin-sod bicarb-citric acid (ALKA-SELTZER) 325 MG TBEF tablet Take 325 mg by mouth every 6 (six) hours as needed (heartburn).    ? buPROPion (ZYBAN) 150 MG 12 hr tablet Take 150 mg by mouth 2 (two) times daily.    ? celecoxib (CELEBREX) 200 MG capsule 1 capsule with food    ? cyclobenzaprine (FLEXERIL) 10 MG tablet Take 1 tablet (10 mg total) by mouth 3 (three) times daily as needed for muscle spasms. 30 tablet 0  ? Fluticasone-Salmeterol (ADVAIR) 100-50 MCG/DOSE AEPB Inhale 1 puff into the lungs in the morning and at bedtime.    ? hydrOXYzine (VISTARIL) 25 MG capsule Take 25-50 mg by mouth every 6 (six) hours as needed for anxiety.    ? losartan (COZAAR) 100 MG tablet Take 1 tablet (100 mg total) by mouth daily. 30 tablet 1  ? mometasone-formoterol (DULERA) 100-5 MCG/ACT AERO Inhale into the lungs.    ? pantoprazole (PROTONIX) 40 MG tablet 1 tablet    ? PARoxetine  (PAXIL) 20 MG tablet 1 tablet in the morning    ? tiZANidine (ZANAFLEX) 4 MG tablet Take 1 tablet (4 mg total) by mouth at bedtime as needed for muscle spasms.    ? Vitamin D, Ergocalciferol, (DRISDOL) 1.25 MG (50000 UNIT) CAPS capsule Take 50,000 Units by mouth once a week.    ? benzonatate (TESSALON) 100 MG capsule Take by mouth 3 (three) times daily as needed for cough. (Patient not taking: Reported on 03/18/2022)    ? potassium chloride (KLOR-CON) 10 MEQ tablet Take 1 tablet (10 mEq total) by mouth daily for 14 days. 14 tablet 0  ? traMADol (ULTRAM) 50 MG tablet Take 50 mg by mouth every 6 (six) hours as needed. (Patient not taking: Reported on 03/18/2022)    ? ?No facility-administered medications prior to visit.  ? ? ? ?Past Medical History:  ?Diagnosis Date  ? Arthritis   ? Asthma   ? Hypertension   ? ? ? ?Past Surgical History:  ?Procedure Laterality Date  ? ANTERIOR CERVICAL DECOMP/DISCECTOMY FUSION N/A 07/23/2021  ? Procedure: Anterior Cervical Discectomy and Fusion Cervical Four-Five/Five-Six;  Surgeon: Julio Sicks, MD;  Location: [facility] OR;  Service: Neurosurgery;  Laterality: N/A;  3C  ? APPENDECTOMY  age 1  ? ? ? ?Review of Systems  ?Constitutional:  Negative for chills, fatigue, fever  and unexpected weight change.  ?Respiratory:  Negative for cough, chest tightness, shortness of breath and wheezing.   ?Cardiovascular:  Negative for chest pain and leg swelling.  ?Gastrointestinal:  Negative for abdominal distention, constipation, diarrhea, nausea and vomiting.  ?Neurological:  Negative for dizziness, weakness, light-headedness and headaches.  ?Hematological:  Does not bruise/bleed easily.  ?   ?Objective:  ?  ?BP 108/75   Pulse 92   Temp 97.9 ?F (36.6 ?C) (Oral)   Ht 5\' 6"  (1.676 m)   Wt 193 lb (87.5 kg)   LMP 04/28/2015   SpO2 99%   BMI 31.15 kg/m?  ?Nursing note and vital signs reviewed. ? ?Physical Exam ?Constitutional:   ?   General: She is not in acute distress. ?   Appearance: She is  well-developed.  ?Cardiovascular:  ?   Rate and Rhythm: Normal rate and regular rhythm.  ?   Heart sounds: Normal heart sounds. No murmur heard. ?  No friction rub. No gallop.  ?Pulmonary:  ?   Effort: Pulmonary effort is normal. No respiratory distress.  ?   Breath sounds: Normal breath sounds. No wheezing or rales.  ?Chest:  ?   Chest wall: No tenderness.  ?Abdominal:  ?   General: Bowel sounds are normal. There is no distension.  ?   Palpations: Abdomen is soft. There is no mass.  ?   Tenderness: There is no abdominal tenderness. There is no guarding or rebound.  ?Skin: ?   General: Skin is warm and dry.  ?Neurological:  ?   Mental Status: She is alert and oriented to person, place, and time.  ?Psychiatric:     ?   Behavior: Behavior normal.     ?   Thought Content: Thought content normal.     ?   Judgment: Judgment normal.  ? ? ? ?Depression screen [facility] 2/9 03/18/2022 09/13/2021  ?Decreased Interest 0 0  ?Down, Depressed, Hopeless 1 0  ?PHQ - 2 Score 1 0  ?  ?   ?Assessment & Plan:  ? ? ?Patient Active Problem List  ? Diagnosis Date Noted  ? Chronic hepatitis C without hepatic coma (HCC) 09/14/2021  ? Cervical spondylosis with myelopathy and radiculopathy 07/23/2021  ? Chronic obstructive pulmonary disease (HCC) 03/01/2021  ? Essential hypertension 01/03/2021  ? Tobacco use disorder 04/20/2019  ? ? ? ?Problem List Items Addressed This Visit   ? ?  ? Digestive  ? Chronic hepatitis C without hepatic coma (HCC) - Primary  ?  Judy Leonard completed Hepatitis C treatment with 8 weeks of Mavyret on 12/18/21 with no adverse side effects. Hepatitis C RNA level has remained undetectable. Recheck Hepatitis C RNA level today for SVR12. At low risk for fibrosis with no routine screening necessary for [facility].  Discussed that if future Hepatitis screening is needed will need to check Hepatitis C RNA level as Hepatitis C antibody will always be positive. Follow up with ID as needed pending lab work results.  ?  ?  ? Relevant Orders  ?  Hepatitis C RNA quantitative  ? ? ? ?I am having Judy Leonard maintain her benzonatate, potassium chloride, amLODipine, losartan, buPROPion, hydrOXYzine, aspirin-sod bicarb-citric acid, Fluticasone-Salmeterol, albuterol, tiZANidine, cyclobenzaprine, traMADol, pantoprazole, PARoxetine, celecoxib, Vitamin D (Ergocalciferol), and Dulera. ? ? ?Follow-up: As needed pending lab work results.  ? ? ?[facility], MSN, FNP-C ?Nurse Practitioner ?[facility] ?[facility] ?[facility] Main number: [phone] ? ? ?

## 2022-03-18 NOTE — Patient Instructions (Signed)
Nice to see you. ? ?We will check your lab work today. ? ?If screening is needed in the future will need to check Hepatitis C RNA level or viral load as Hepatitis C antibody will always be positive.  ? ?Follow up as needed. ? ?Have a great day and stay safe! ? ?

## 2022-03-18 NOTE — Assessment & Plan Note (Signed)
Judy Leonard completed Hepatitis C treatment with 8 weeks of Mavyret on 12/18/21 with no adverse side effects. Hepatitis C RNA level has remained undetectable. Recheck Hepatitis C RNA level today for SVR12. At low risk for fibrosis with no routine screening necessary for [facility].  Discussed that if future Hepatitis screening is needed will need to check Hepatitis C RNA level as Hepatitis C antibody will always be positive. Follow up with ID as needed pending lab work results.  ?

## 2022-03-19 LAB — HEPATITIS C RNA QUANTITATIVE
HCV Quantitative Log: <1.18 log IU/mL
HCV RNA, PCR, QN: <15 IU/mL

## 2022-11-14 ENCOUNTER — Encounter: Payer: Self-pay | Admitting: Orthopaedic Surgery

## 2022-11-14 ENCOUNTER — Encounter: Payer: Self-pay | Admitting: Internal Medicine

## 2022-11-14 ENCOUNTER — Ambulatory Visit (INDEPENDENT_AMBULATORY_CARE_PROVIDER_SITE_OTHER): Payer: Medicare Other | Admitting: Internal Medicine

## 2022-11-14 ENCOUNTER — Telehealth: Payer: Self-pay | Admitting: Internal Medicine

## 2022-11-14 ENCOUNTER — Ambulatory Visit (INDEPENDENT_AMBULATORY_CARE_PROVIDER_SITE_OTHER): Payer: Medicare Other | Admitting: Orthopaedic Surgery

## 2022-11-14 VITALS — BP 132/84 | HR 64 | Temp 98.2°F | Ht 66.0 in | Wt 207.4 lb

## 2022-11-14 DIAGNOSIS — F17210 Nicotine dependence, cigarettes, uncomplicated: Secondary | ICD-10-CM

## 2022-11-14 DIAGNOSIS — J449 Chronic obstructive pulmonary disease, unspecified: Secondary | ICD-10-CM | POA: Insufficient documentation

## 2022-11-14 DIAGNOSIS — S62522A Displaced fracture of distal phalanx of left thumb, initial encounter for closed fracture: Secondary | ICD-10-CM

## 2022-11-14 DIAGNOSIS — J4489 Other specified chronic obstructive pulmonary disease: Secondary | ICD-10-CM

## 2022-11-14 MED ORDER — DULERA 100-5 MCG/ACT IN AERO: INHALATION_SPRAY | RESPIRATORY_TRACT | 11 refills | Status: DC

## 2022-11-14 MED ORDER — HYDROCODONE-ACETAMINOPHEN 5-325 MG PO TABS: ORAL_TABLET | ORAL | 0 refills | Status: DC

## 2022-11-14 NOTE — Assessment & Plan Note (Addendum)
Active smoker  -  Spirometry 10/17/22 digital readout blurred,  f/v curve not concave   - 11/14/2022   Walked on RA   x  3  lap(s) =  approx 450  ft  @ mod pace, stopped due to end of study  with lowest 02 sats 94% with sob p one lap but never stopped - 11/14/2022  After extensive coaching inhaler device,  effectiveness =    75% short Ti late trigger > dulera 100 Take 2 puffs first thing in am and then another 2 puffs about 12 hours later.   DDX of  difficult airways management almost all start with A and  include Adherence, Ace Inhibitors, Acid Reflux, Active Sinus Disease, Alpha 1 Antitripsin deficiency, Anxiety masquerading as Airways dz,  ABPA,  Allergy(esp in young), Aspiration (esp in elderly), Adverse effects of meds,  Active smoking or vaping, A bunch of PE's (a small clot burden can't cause this syndrome unless there is already severe underlying pulm or vascular dz with poor reserve) plus two Bs  = Bronchiectasis and Beta blocker use..and one C= CHF  Adherence is always the initial "prime suspect" and is a multilayered concern that requires a "trust but verify" approach in every patient - starting with knowing how to use medications, especially inhalers, correctly, keeping up with refills and understanding the fundamental difference between maintenance and prns vs those medications only taken for a very short course and then stopped and not refilled.  - see hfa teaching - return  with all meds in hand using a trust but verify approach to confirm accurate Medication  Reconciliation The principal here is that until we are certain that the  patients are doing what we've asked, it makes no sense to ask them to do more.   Active smoking also at top of list (see separate a/p)   ? Adverse effects of dpi > d/c advair and try dulera 100 2bid  ? Allergy > doubt, should be covered by now with dulera 100   ? Acid (or non-acid) GERD > always difficult to exclude as up to 75% of pts in some series report  no assoc GI/ Heartburn symptoms> rec continue max (24h)  acid suppression and diet restrictions/ reviewed

## 2022-11-14 NOTE — Patient Instructions (Addendum)
Plan A = Automatic = Always=    Dulera 100 Take 2 puffs first thing in am and then another 2 puffs about 12 hours later.    Plan B = Backup (to supplement plan A, not to replace it) Only use your albuterol inhaler as a rescue medication to be used if you can't catch your breath by resting or doing a relaxed purse lip breathing pattern.  - The less you use it, the better it will work when you need it. - Ok to use the inhaler up to 2 puffs  every 4 hours if you must but call for appointment if use goes up over your usual need - Don't leave home without it !!  (think of it like the starter fluid spare tire for your car)   Also  Ok to try albuterol 15 min before an activity (on alternating days)  that you know would usually make you short of breath and see if it makes any difference and if makes none then don't take albuterol after activity unless you can't catch your breath as this means it's the resting that helps, not the albuterol.   Stop fluticasone inhaler  Please schedule a follow up office visit in 6 weeks, call sooner if needed - bring inhalers

## 2022-11-14 NOTE — Patient Instructions (Signed)
 KEEP YOUR BRACE ON EXCEPT FOR HYGIENE. YOUR BREAK WITH TAKE 6 WEEKS OR A LITTLE MORE TO HEAL.  FOLLOW UP 2 WEEKS WITH FOLLOW UP XRAY  As the weather changes and gets cooler, you may notice you are affected more. You may have more pain in your joints. This is normal. Dress warmly and make sure that area is covered well.   Dr.Keeling is here all day on Tuesdays, Wednesday mornings, and Thursday mornings. If you need anything such as a medication refill, please either call BEFORE the end of the day on [facility] or send a message through [place]. Your pharmacy can send a refill request for you. Calling by the end of the day on [facility] allows [place] time to send Dr.Keeling the request and for him to respond before he leaves on Thursdays.  If Dr. Hilda Lias is out of the office, we may send it to one of the other providers and they may not refill it for the same amount that your original prescription is for.   MY NAME IS  AND I AM DR.KEELING'S ASSISTANT. IF YOU NEED ANYTHING, PLEASE DO NOT HESITATE TO EITHER SEND ME A MESSAGE VIA MYCHART OR CALL THE OFFICE [phone] AND LEAVE A MESSAGE FOR ME. I WILL RESPOND WITHIN 24-48 BUSINESS HOURS.

## 2022-11-14 NOTE — Progress Notes (Signed)
Judy Leonard, female    DOB: [birth year]    MRN: [id number]   Brief patient profile:  56   yobf active smoker   referred to pulmonary clinic in [place]  11/14/2022 by [name]  NP for copd eval with onset of symptoms 2019 with abn pfts .     History of Present Illness  11/14/2022  Pulmonary/ 1st office eval/ Judy Leonard / [place] [facility] advair 100 one daily  / dulera 100 each pm with poor hfa technique Chief Complaint  Patient presents with   Consult    Consult for SOB with exertion   Dyspnea:  pushing basket basket at [employer]  Cough: smoker's rattle, worse  in am clears with 1-2 coughs /mucoid Sleep: once a week, waking up choking x sev years  SABA use: avg 3 x daily / never prechallenges  02: none  Lung cancer screen: rec per PCP   No obvious day to day or daytime pattern/variability or assoc  purulent sputum or mucus plugs or hemoptysis or cp or chest tightness, subjective wheeze or overt sinus or hb symptoms.    . Also denies any obvious fluctuation of symptoms with weather or environmental changes or other aggravating or alleviating factors except as outlined above   No unusual exposure hx or h/o childhood pna/ asthma or knowledge of premature birth.  Current Allergies, Complete Past Medical History, Past Surgical History, Family History, and Social History were reviewed in [employer] record.  ROS  The following are not active complaints unless bolded Hoarseness, sore throat, dysphagia, dental problems, itching, sneezing,  nasal congestion or discharge of excess mucus or purulent secretions, ear ache,   fever, chills, sweats, unintended wt loss or wt gain, classically pleuritic or exertional cp,  orthopnea pnd or arm/hand swelling  or leg swelling, presyncope, palpitations, abdominal pain, anorexia, nausea, vomiting, diarrhea  or change in bowel habits or change in bladder habits, change in stools or change in urine, dysuria, hematuria,  rash,  arthralgias, visual complaints, headache, numbness, weakness or ataxia or problems with walking or coordination,  change in mood or  memory.             Past Medical History:  Diagnosis Date   Arthritis    Asthma    Hypertension     Outpatient Medications Prior to Visit  Medication Sig Dispense Refill   albuterol (PROVENTIL HFA) 108 (90 Base) MCG/ACT inhaler Inhale 2 puffs into the lungs every 6 (six) hours as needed for wheezing or shortness of breath.     ALPRAZolam (XANAX) 0.5 MG tablet Take 0.5 mg by mouth at bedtime as needed for anxiety.     amLODipine (NORVASC) 10 MG tablet Take 1 tablet (10 mg total) by mouth daily. 30 tablet 1   aspirin-sod bicarb-citric acid (ALKA-SELTZER) 325 MG TBEF tablet Take 325 mg by mouth every 6 (six) hours as needed (heartburn).     benzonatate (TESSALON) 100 MG capsule Take by mouth 3 (three) times daily as needed for cough.     buPROPion (ZYBAN) 150 MG 12 hr tablet Take 150 mg by mouth 2 (two) times daily.     celecoxib (CELEBREX) 200 MG capsule 1 capsule with food     DULoxetine (CYMBALTA) 20 MG capsule Take 20 mg by mouth daily.     Fluticasone-Salmeterol (ADVAIR) 100-50 MCG/DOSE AEPB Inhale 1 puff into the lungs in the morning and at bedtime.     HYDROcodone-acetaminophen (NORCO/VICODIN) 5-325 MG tablet One tablet every  four hours for pain. 30 tablet 0   losartan (COZAAR) 100 MG tablet Take 1 tablet (100 mg total) by mouth daily. 30 tablet 1   mometasone-formoterol (DULERA) 100-5 MCG/ACT AERO Inhale into the lungs.     pantoprazole (PROTONIX) 40 MG tablet 1 tablet     tiZANidine (ZANAFLEX) 4 MG tablet Take 1 tablet (4 mg total) by mouth at bedtime as needed for muscle spasms.     Vitamin D, Ergocalciferol, (DRISDOL) 1.25 MG (50000 UNIT) CAPS capsule Take 50,000 Units by mouth once a week.     potassium chloride (KLOR-CON) 10 MEQ tablet Take 1 tablet (10 mEq total) by mouth daily for 14 days. 14 tablet 0   No facility-administered medications  prior to visit.     Objective:     BP 132/84   Pulse 64   Temp 98.2 F (36.8 C)   Ht 5\' 6"  (1.676 m)   Wt 207 lb 6.4 oz (94.1 kg)   LMP 04/28/2015   SpO2 97% Comment: ra  BMI 33.48 kg/m   SpO2: 97 % (ra)  Pleasant amb bf nad     HEENT : Oropharynx  clear   Nasal turbinates nl    NECK :  without  apparent JVD/ palpable Nodes/TM    LUNGS: no acc muscle use,  Min barrel  contour chest wall with bilateral  slightly decreased bs s audible wheeze and  without cough on insp or exp maneuvers and min  Hyperresonant  to  percussion bilaterally    CV:  RRR  no s3 or murmur or increase in P2, and no edema   ABD:  soft and nontender with pos end  insp Hoover's  in the supine position.  No bruits or organomegaly appreciated   MS:  Nl gait/ ext warm without deformities Or obvious joint restrictions  calf tenderness, cyanosis or clubbing     SKIN: warm and dry without lesions    NEURO:  alert, approp, nl sensorium with  no motor or cerebellar deficits apparent.         I personally reviewed    radiology impression as follows:  CXR:   03/04/22  Subsegmental atelectasis in the lower lungs.       Assessment   Asthmatic bronchitis , chronic Active smoker  -  Spirometry 10/17/22 digital readout blurred,  f/v curve not concave   - 11/14/2022   Walked on RA   x  3  lap(s) =  approx 450  ft  @ mod pace, stopped due to end of study  with lowest 02 sats 94% with sob p one lap but never stopped - 11/14/2022  After extensive coaching inhaler device,  effectiveness =    75% short Ti late trigger > dulera 100 Take 2 puffs first thing in am and then another 2 puffs about 12 hours later.   DDX of  difficult airways management almost all start with A and  include Adherence, Ace Inhibitors, Acid Reflux, Active Sinus Disease, Alpha 1 Antitripsin deficiency, Anxiety masquerading as Airways dz,  ABPA,  Allergy(esp in young), Aspiration (esp in elderly), Adverse effects of meds,  Active smoking or  vaping, A bunch of PE's (a small clot burden can't cause this syndrome unless there is already severe underlying pulm or vascular dz with poor reserve) plus two Bs  = Bronchiectasis and Beta blocker use..and one C= CHF  Adherence is always the initial "prime suspect" and is a multilayered concern that requires a "trust but verify"  approach in every patient - starting with knowing how to use medications, especially inhalers, correctly, keeping up with refills and understanding the fundamental difference between maintenance and prns vs those medications only taken for a very short course and then stopped and not refilled.  - see hfa teaching - return  with all meds in hand using a trust but verify approach to confirm accurate Medication  Reconciliation The principal here is that until we are certain that the  patients are doing what we've asked, it makes no sense to ask them to do more.   Active smoking also at top of list (see separate a/p)   ? Adverse effects of dpi > d/c advair and try dulera 100 2bid  ? Allergy > doubt, should be covered by now with dulera 100   ? Acid (or non-acid) GERD > always difficult to exclude as up to 75% of pts in some series report no assoc GI/ Heartburn symptoms> rec continue max (24h)  acid suppression and diet restrictions/ reviewed     Cigarette smoker Counseled re importance of smoking cessation but did not meet time criteria for separate billing   Low-dose CT lung cancer screening is recommended for patients who are 62-66 years of age with a 20+ pack-year history of smoking and who are currently smoking or quit <=15 years ago. No coughing up blood  No unintentional weight loss of > 15 pounds in the last 6 months - pt is eligible for scanning yearly until 15 y p last smokes> deferred to PCP unless otherwise requested (can be done as part of yearly re-eval)     Each maintenance medication was reviewed in detail including emphasizing most importantly the  difference between maintenance and prns and under what circumstances the prns are to be triggered using an action plan format where appropriate.  Total time for H and P, chart review, counseling, reviewing hfa device(s) , directly observing portions of ambulatory 02 saturation study/ and generating customized AVS unique to this office visit / same day charting > 45 min new pt eval                   Sandrea Hughs, MD 11/14/2022

## 2022-11-14 NOTE — Assessment & Plan Note (Addendum)
Counseled re importance of smoking cessation but did not meet time criteria for separate billing   Low-dose CT lung cancer screening is recommended for patients who are 75-57 years of age with a 20+ pack-year history of smoking and who are currently smoking or quit <=15 years ago. No coughing up blood  No unintentional weight loss of > 15 pounds in the last 6 months - pt is eligible for scanning yearly until 15 y p last smokes> deferred to PCP unless otherwise requested (can be done as part of yearly re-eval)     Each maintenance medication was reviewed in detail including emphasizing most importantly the difference between maintenance and prns and under what circumstances the prns are to be triggered using an action plan format where appropriate.  Total time for H and P, chart review, counseling, reviewing hfa device(s) , directly observing portions of ambulatory 02 saturation study/ and generating customized AVS unique to this office visit / same day charting > 45 min new pt eval

## 2022-11-14 NOTE — Telephone Encounter (Signed)
Be sure to get another fax on her spirometry from 10/17/22 that is legible

## 2022-11-14 NOTE — Progress Notes (Signed)
Subjective:    Patient ID: Judy Leonard, female    DOB: 1966/11/21, 56 y.o.   MRN: [id number]  HPI She fell and hurt her left nondominant thumb on 11-01-22.  She went to the ER at [facility] on 11-04-22.  X-rays showed a comminuted fracture of the distal phalanx of the left thumb.  She was given a splint.  She has done well with the splint but it keeps coming off and is uncomfortable.  She has no other injury.  I have reviewed the notes and the X-rays.  I have independently reviewed and interpreted x-rays of this patient done at another site by another physician or qualified health professional.  I will give another type of thumb splint today.   Review of Systems  Constitutional:  Positive for activity change.  Musculoskeletal:  Positive for arthralgias and joint swelling.  All other systems reviewed and are negative. For Review of Systems, all other systems reviewed and are negative.  The following is a summary of the past history medically, past history surgically, known current medicines, social history and family history.  This information is gathered electronically by the computer from prior information and documentation.  I review this each visit and have found including this information at this point in the chart is beneficial and informative.   Past Medical History:  Diagnosis Date   Arthritis    Asthma    Hypertension     Past Surgical History:  Procedure Laterality Date   ANTERIOR CERVICAL DECOMP/DISCECTOMY FUSION N/A 07/23/2021   Procedure: Anterior Cervical Discectomy and Fusion Cervical Four-Five/Five-Six;  Surgeon: Julio Sicks, MD;  Location: [facility] OR;  Service: Neurosurgery;  Laterality: N/A;  3C   APPENDECTOMY  age 36    Current Outpatient Medications on File Prior to Visit  Medication Sig Dispense Refill   albuterol (PROVENTIL HFA) 108 (90 Base) MCG/ACT inhaler Inhale 2 puffs into the lungs every 6 (six) hours as needed for wheezing or shortness of  breath.     ALPRAZolam (XANAX) 0.5 MG tablet Take 0.5 mg by mouth at bedtime as needed for anxiety.     amLODipine (NORVASC) 10 MG tablet Take 1 tablet (10 mg total) by mouth daily. 30 tablet 1   aspirin-sod bicarb-citric acid (ALKA-SELTZER) 325 MG TBEF tablet Take 325 mg by mouth every 6 (six) hours as needed (heartburn).     benzonatate (TESSALON) 100 MG capsule Take by mouth 3 (three) times daily as needed for cough.     buPROPion (ZYBAN) 150 MG 12 hr tablet Take 150 mg by mouth 2 (two) times daily.     celecoxib (CELEBREX) 200 MG capsule 1 capsule with food     DULoxetine (CYMBALTA) 20 MG capsule Take 20 mg by mouth daily.     Fluticasone-Salmeterol (ADVAIR) 100-50 MCG/DOSE AEPB Inhale 1 puff into the lungs in the morning and at bedtime.     losartan (COZAAR) 100 MG tablet Take 1 tablet (100 mg total) by mouth daily. 30 tablet 1   mometasone-formoterol (DULERA) 100-5 MCG/ACT AERO Inhale into the lungs.     pantoprazole (PROTONIX) 40 MG tablet 1 tablet     tiZANidine (ZANAFLEX) 4 MG tablet Take 1 tablet (4 mg total) by mouth at bedtime as needed for muscle spasms.     Vitamin D, Ergocalciferol, (DRISDOL) 1.25 MG (50000 UNIT) CAPS capsule Take 50,000 Units by mouth once a week.     potassium chloride (KLOR-CON) 10 MEQ tablet Take 1 tablet (10 mEq total) by  mouth daily for 14 days. 14 tablet 0   No current facility-administered medications on file prior to visit.    Social History   Socioeconomic History   Marital status: Single    Spouse name: Not on file   Number of children: Not on file   Years of education: Not on file   Highest education level: Not on file  Occupational History   Not on file  Tobacco Use   Smoking status: Every Day    Packs/day: 0.25    Years: 31.00    Total pack years: 7.75    Types: Cigarettes    Last attempt to quit: 04/29/2020    Years since quitting: 2.5   Smokeless tobacco: Never   Tobacco comments:    1-2 cig days; working on quitting (taking  medication prescribed by primary)  Vaping Use   Vaping Use: Never used  Substance and Sexual Activity   Alcohol use: Yes    Comment: occasionally   Drug use: Not Currently   Sexual activity: Not on file  Other Topics Concern   Not on file  Social History Narrative   Not on file   Social Determinants of Health   Financial Resource Strain: Not on file  Food Insecurity: Not on file  Transportation Needs: Not on file  Physical Activity: Not on file  Stress: Not on file  Social Connections: Not on file  Intimate Partner Violence: Not on file    Family History  Problem Relation Age of Onset   Diabetes Mother    Kidney disease Mother    Hypertension Mother    Heart failure Mother     LMP 04/28/2015   There is no height or weight on file to calculate BMI.      Objective:   Physical Exam Vitals and nursing note reviewed. Exam conducted with a chaperone present.  Constitutional:      Appearance: She is well-developed.  HENT:     Head: Normocephalic and atraumatic.  Eyes:     Conjunctiva/sclera: Conjunctivae normal.     Pupils: Pupils are equal, round, and reactive to light.  Cardiovascular:     Rate and Rhythm: Normal rate and regular rhythm.  Pulmonary:     Effort: Pulmonary effort is normal.  Abdominal:     Palpations: Abdomen is soft.  Musculoskeletal:       Hands:     Cervical back: Normal range of motion and neck supple.  Skin:    General: Skin is warm and dry.  Neurological:     Mental Status: She is alert and oriented to person, place, and time.     Cranial Nerves: No cranial nerve deficit.     Motor: No abnormal muscle tone.     Coordination: Coordination normal.     Deep Tendon Reflexes: Reflexes are normal and symmetric. Reflexes normal.  Psychiatric:        Behavior: Behavior normal.        Thought Content: Thought content normal.        Judgment: Judgment normal.           Assessment & Plan:   Encounter Diagnosis  Name Primary?    Closed displaced fracture of distal phalanx of left thumb, initial encounter Yes   I have given new splint.  Return in two weeks.  X-rays then.  I have reviewed the [place] Controlled Substance Reporting System web site prior to prescribing narcotic medicine for this patient.  Call if any problem.  Precautions discussed.  Electronically Signed Darreld Mclean, MD 11/16/202310:54 AM

## 2022-11-15 NOTE — Telephone Encounter (Signed)
Faxed request to patients PCP yesterday.

## 2022-11-20 ENCOUNTER — Encounter: Payer: Self-pay | Admitting: Internal Medicine

## 2022-11-28 ENCOUNTER — Encounter: Payer: Self-pay | Admitting: Orthopaedic Surgery

## 2022-11-28 ENCOUNTER — Ambulatory Visit (INDEPENDENT_AMBULATORY_CARE_PROVIDER_SITE_OTHER): Payer: Medicare Other

## 2022-11-28 ENCOUNTER — Ambulatory Visit (INDEPENDENT_AMBULATORY_CARE_PROVIDER_SITE_OTHER): Payer: Medicare Other | Admitting: Orthopaedic Surgery

## 2022-11-28 DIAGNOSIS — S62522A Displaced fracture of distal phalanx of left thumb, initial encounter for closed fracture: Secondary | ICD-10-CM | POA: Diagnosis not present

## 2022-11-28 DIAGNOSIS — S62522D Displaced fracture of distal phalanx of left thumb, subsequent encounter for fracture with routine healing: Secondary | ICD-10-CM

## 2022-11-28 NOTE — Progress Notes (Signed)
I am better.  She is wearing her splint for the left thumb fracture.  She has no new trauma.  Her pain is less.  NV intact of the left thumb, slight swelling is present.  Encounter Diagnosis  Name Primary?   Closed displaced fracture of distal phalanx of left thumb with routine healing, subsequent encounter Yes   Continue the splint.  X-rays were done of the left thumb, reported separately.  Call if any problem.  Precautions discussed.  Electronically Signed Darreld Mclean, MD 11/30/20239:35 AM

## 2022-12-12 ENCOUNTER — Ambulatory Visit (INDEPENDENT_AMBULATORY_CARE_PROVIDER_SITE_OTHER): Payer: Medicare Other | Admitting: Orthopaedic Surgery

## 2022-12-12 ENCOUNTER — Encounter: Payer: Self-pay | Admitting: Orthopaedic Surgery

## 2022-12-12 ENCOUNTER — Telehealth: Payer: Self-pay

## 2022-12-12 ENCOUNTER — Ambulatory Visit (INDEPENDENT_AMBULATORY_CARE_PROVIDER_SITE_OTHER): Payer: Medicare Other

## 2022-12-12 VITALS — BP 130/86 | HR 82 | Ht 66.0 in

## 2022-12-12 DIAGNOSIS — S62522D Displaced fracture of distal phalanx of left thumb, subsequent encounter for fracture with routine healing: Secondary | ICD-10-CM

## 2022-12-12 MED ORDER — HYDROCODONE-ACETAMINOPHEN 5-325 MG PO TABS: ORAL_TABLET | ORAL | 0 refills | Status: DC

## 2022-12-12 NOTE — Patient Instructions (Signed)
Medication Instructions:  NO CHANGES    Testing/Procedures: N/A  Follow-Up: 3 WEEKS   Any Other Special Instructions Will Be Listed Below (If Applicable).  START BENDING THAT THUMB FOR EXERCISE SO IT DOESN'T GET STIFF. YOU CAN START COMING OUT OF YOUR SPLINT   If you need a refill on your  medications prescribed by Dr. Hilda Lias before your next appointment, please call your pharmacy.

## 2022-12-12 NOTE — Telephone Encounter (Signed)
Hydrocodone-Acetaminophen 5/325 MG Qty 30 Tablets  PATIENT USES [facility]  Stated she forgot to mention it

## 2022-12-12 NOTE — Progress Notes (Signed)
My thumb is not hurting.  She has been using the splint for her left thumb. She has no new problem.  NV intact of the left thumb, no redness or swelling.  X-rays were done of the left thumb, reported separately.  Encounter Diagnosis  Name Primary?   Closed displaced fracture of distal phalanx of left thumb with routine healing, subsequent encounter Yes   Return in three weeks.  Continue the splint.  X-rays on return of the left thumb.  Call if any problem.  Precautions discussed.  Electronically Signed Darreld Mclean, MD 12/14/20239:07 AM

## 2022-12-27 ENCOUNTER — Ambulatory Visit (INDEPENDENT_AMBULATORY_CARE_PROVIDER_SITE_OTHER): Payer: Medicare Other | Admitting: Internal Medicine

## 2022-12-27 ENCOUNTER — Encounter: Payer: Self-pay | Admitting: Internal Medicine

## 2022-12-27 VITALS — BP 132/80 | HR 82 | Temp 98.4°F | Ht 66.0 in | Wt 202.2 lb

## 2022-12-27 DIAGNOSIS — J449 Chronic obstructive pulmonary disease, unspecified: Secondary | ICD-10-CM

## 2022-12-27 DIAGNOSIS — F17210 Nicotine dependence, cigarettes, uncomplicated: Secondary | ICD-10-CM

## 2022-12-27 MED ORDER — PANTOPRAZOLE SODIUM 40 MG PO TBEC: 40.0000 mg | DELAYED_RELEASE_TABLET | Freq: Every day | ORAL | 2 refills | Status: AC

## 2022-12-27 MED ORDER — PREDNISONE 10 MG PO TABS: ORAL_TABLET | ORAL | 0 refills | Status: DC

## 2022-12-27 MED ORDER — FAMOTIDINE 20 MG PO TABS: ORAL_TABLET | ORAL | 11 refills | Status: DC

## 2022-12-27 NOTE — Assessment & Plan Note (Signed)
Counseled re importance of smoking cessation but did not meet time criteria for separate billing          Each maintenance medication was reviewed in detail including emphasizing most importantly the difference between maintenance and prns and under what circumstances the prns are to be triggered using an action plan format where appropriate.  Total time for H and P, chart review, counseling, reviewing hfa device(s) , directly observing portions of ambulatory 02 saturation study/ and generating customized AVS unique to this office visit / same day charting  > 30 min                    

## 2022-12-27 NOTE — Patient Instructions (Addendum)
Prednisone 10 mg take  4 each am x 2 days,   2 each am x 2 days,  1 each am x 2 days and stop   For cough > mucinex dm 1200 mg every 12 hours as needed   The key is to stop smoking completely before smoking completely stops you!     Continue dulera 100 Take 2 puffs first thing in am and then another 2 puffs about 12 hours later.    Work on inhaler technique:  relax and gently blow all the way out then take a nice smooth full deep breath back in, triggering the inhaler at same time you start breathing in.  Hold breath in for at least  5 seconds if you can. Blow out [employer] out  thru nose. Rinse and gargle with water when done.  If mouth or throat bother you at all,  try brushing teeth/gums/tongue with arm and hammer toothpaste/ make a slurry and gargle and spit out.   Only use your albuterol as a rescue medication to be used if you can't catch your breath by resting or doing a relaxed purse lip breathing pattern.  - The less you use it, the better it will work when you need it. - Ok to use up to 2 puffs  every 4 hours if you must but call for immediate appointment if use goes up over your usual need - Don't leave home without it !!  (think of it like the spare tire for your car)   Also  Ok to try albuterol 15 min before an activity (on alternating days)  that you know would usually make you short of breath and see if it makes any difference and if makes none then don't take albuterol after activity unless you can't catch your breath as this means it's the resting that helps, not the albuterol.      Pantoprazole (protonix) 40 mg   Take  30-60 min before first meal of the day and Pepcid (famotidine)  20 mg an hour before bed until return to office - this is the best way to tell whether stomach acid is contributing to your problem.    GERD (REFLUX)  is an extremely common cause of respiratory symptoms just like yours , many times with no obvious heartburn at all.    It can be treated with  medication, but also with lifestyle changes including elevation of the head of your bed (ideally with 6 -8inch blocks under the headboard of your bed),  Smoking cessation, avoidance of late meals, excessive alcohol, and avoid fatty foods, chocolate, peppermint, colas, red wine, and acidic juices such as orange juice.  NO MINT OR MENTHOL PRODUCTS SO NO COUGH DROPS  USE SUGARLESS CANDY INSTEAD (Jolley ranchers or Stover's or Life Savers) or even ice chips will also do - the key is to swallow to prevent all throat clearing. NO OIL BASED VITAMINS - use powdered substitutes.  Avoid fish oil when coughing.    Please remember to go to the  x-ray department  @  [facility] for your tests - we will call you with the results when they are available     Please schedule a follow up office visit in 6 weeks in [place] office if at all possible

## 2022-12-27 NOTE — Assessment & Plan Note (Signed)
Active smoker  -  Spirometry 10/17/22 digital readout blurred,  f/v curve not concave  FEV1 1.39 (58%)  ratio 0.55 - 11/14/2022   Walked on RA   x  3  lap(s) =  approx 450  ft  @ mod pace, stopped due to end of study  with lowest 02 sats 94% with sob p one lap but never stopped - 11/14/2022  After extensive coaching inhaler device,  effectiveness =    75% short Ti late trigger > dulera 100 Take 2 puffs first thing in am and then another 2 puffs about 12 hours later.  - 12/27/2022   Walked on RA  x  3  lap(s) =  approx 450  ft  @ mod pace, stopped due to end of study with lowest 02 sats 95% and only mild sob p lap 1   - The proper method of use, as well as anticipated side effects, of a metered-dose inhaler were discussed and demonstrated to the patient using teach back method.    Concern with higher dose of ICS = more cough so instead rec max rx for GERD and continue low dose dulera and >>> also so added 6 day taper off  Prednisone starting at 40 mg per day in case of component of Th-2 driven upper or lower airways inflammation (if cough responds short term only to relapse before return while will on full rx for uacs (as above), then  that would point to allergic rhinitis/ asthma or eos bronchitis as alternative dx)  and f/u in 6 weeks

## 2022-12-27 NOTE — Progress Notes (Signed)
Judy Leonard, female    DOB: 1966-01-30    MRN: [id number]   Brief patient profile:  36 yobf active smoker   referred to pulmonary clinic in [place]  11/14/2022 by [name]  NP for copd eval with onset of symptoms 2019 with abn pfts .  History of Present Illness  11/14/2022  Pulmonary/ 1st office eval/ Judy Leonard / [place] Office advair 100 one daily  / dulera 100 each pm with poor hfa technique Chief Complaint  Patient presents with   Consult    Consult for SOB with exertion   Dyspnea:  pushing basket basket at [employer]  Cough: smoker's rattle, worse  in am clears with 1-2 coughs /mucoid Sleep: once a week, waking up choking x sev years  SABA use: avg 3 x daily / never prechallenges  02: none  Lung cancer screen: rec per PCP  Rec Plan A = Automatic = Always=    Dulera 100 Take 2 puffs first thing in am and then another 2 puffs about 12 hours later.  Plan B = Backup (to supplement plan A, not to replace it) Only use your albuterol inhaler as a rescue medication Also  Ok to try albuterol 15 min before an activity (on alternating days)  that you know would usually make you short of breath Stop fluticasone inhaler  Please schedule a follow up office visit in 6 weeks, call sooner if needed - bring inhalers    12/27/2022  f/u ov/Judy office/Judy Leonard re: AB maint on dulera 100  2 bid   Chief Complaint  Patient presents with   Follow-up    Still coughing up phlegm (clear) during the night   Dyspnea:  pushing cart at [employer]  Cough: clear mucus/ no better on dulera  Sleeping: still waking up 2-5 am  3-4 nights a week due t cough  SABA use: 4x daily  02: none    No obvious day to day or daytime variability or assoc  purulent sputum or mucus plugs or hemoptysis or cp or chest tightness, subjective wheeze or overt sinus or hb symptoms.     Also denies any obvious fluctuation of symptoms with weather or environmental changes or other aggravating or alleviating factors  except as outlined above   No unusual exposure hx or h/o childhood pna/ asthma or knowledge of premature birth.  Current Allergies, Complete Past Medical History, Past Surgical History, Family History, and Social History were reviewed in [employer] record.  ROS  The following are not active complaints unless bolded Hoarseness, sore throat, dysphagia, dental problems, itching, sneezing,  nasal congestion or discharge of excess mucus or purulent secretions, ear ache,   fever, chills, sweats, unintended wt loss or wt gain, classically pleuritic or exertional cp,  orthopnea pnd or arm/hand swelling  or leg swelling, presyncope, palpitations, abdominal pain, anorexia, nausea, vomiting, diarrhea  or change in bowel habits or change in bladder habits, change in stools or change in urine, dysuria, hematuria,  rash, arthralgias, visual complaints, headache, numbness, weakness or ataxia or problems with walking or coordination,  change in mood or  memory.        Current Meds  Medication Sig   albuterol (PROVENTIL HFA) 108 (90 Base) MCG/ACT inhaler Inhale 2 puffs into the lungs every 6 (six) hours as needed for wheezing or shortness of breath.   ALPRAZolam (XANAX) 0.5 MG tablet Take 0.5 mg by mouth at bedtime as needed for anxiety.   aspirin-sod bicarb-citric acid (  ALKA-SELTZER) 325 MG TBEF tablet Take 325 mg by mouth every 6 (six) hours as needed (heartburn).   benzonatate (TESSALON) 100 MG capsule Take by mouth 3 (three) times daily as needed for cough.   buPROPion (ZYBAN) 150 MG 12 hr tablet Take 150 mg by mouth 2 (two) times daily.   celecoxib (CELEBREX) 200 MG capsule 1 capsule with food   DULoxetine (CYMBALTA) 20 MG capsule Take 20 mg by mouth daily.   HYDROcodone-acetaminophen (NORCO/VICODIN) 5-325 MG tablet One tablet every six hours for pain.  Limit 7 days.   losartan (COZAAR) 100 MG tablet Take 1 tablet (100 mg total) by mouth daily.   mometasone-formoterol (DULERA) 100-5  MCG/ACT AERO Take 2 puffs first thing in am and then another 2 puffs about 12 hours later.   pantoprazole (PROTONIX) 40 MG tablet 1 tablet   tiZANidine (ZANAFLEX) 4 MG tablet Take 1 tablet (4 mg total) by mouth at bedtime as needed for muscle spasms.            Past Medical History:  Diagnosis Date   Arthritis    Asthma    Hypertension        Objective:       12/27/2022     202   11/14/22 207 lb 6.4 oz (94.1 kg)  03/18/22 193 lb (87.5 kg)  09/13/21 199 lb 9.6 oz (90.5 kg)      Vital signs reviewed  12/27/2022  - Note at rest 02 sats  97% on RA   General appearance:    amb bf nad   / smoker's rattle    HEENT : Oropharynx  clear      NECK :  without  apparent JVD/ palpable Nodes/TM    LUNGS: no acc muscle use,  Min barrel  contour chest wall with bilateral  slightly decreased bs s audible wheeze and  without cough on insp or exp maneuvers and min  Hyperresonant  to  percussion bilaterally    CV:  RRR  no s3 or murmur or increase in P2, and no edema   ABD:  soft and nontender with pos end  insp Hoover's  in the supine position.  No bruits or organomegaly appreciated   MS:  Nl gait/ ext warm without deformities Or obvious joint restrictions  calf tenderness, cyanosis or clubbing     SKIN: warm and dry without lesions    NEURO:  alert, approp, nl sensorium with  no motor or cerebellar deficits apparent.        Rec cxr 12/27/2022 > did not go for cxr as rec             Assessment

## 2023-01-02 ENCOUNTER — Ambulatory Visit (HOSPITAL_COMMUNITY)
Admission: RE | Admit: 2023-01-02 | Discharge: 2023-01-02 | Disposition: A | Discharge status: Home / Self Care | Payer: Medicare Other | Source: Ambulatory Visit | Attending: Internal Medicine | Admitting: Internal Medicine

## 2023-01-02 ENCOUNTER — Encounter: Payer: Self-pay | Admitting: Orthopaedic Surgery

## 2023-01-02 ENCOUNTER — Ambulatory Visit (INDEPENDENT_AMBULATORY_CARE_PROVIDER_SITE_OTHER): Payer: Medicare Other

## 2023-01-02 ENCOUNTER — Ambulatory Visit (INDEPENDENT_AMBULATORY_CARE_PROVIDER_SITE_OTHER): Payer: Medicare Other | Admitting: Orthopaedic Surgery

## 2023-01-02 VITALS — BP 107/69 | HR 95

## 2023-01-02 DIAGNOSIS — S62522D Displaced fracture of distal phalanx of left thumb, subsequent encounter for fracture with routine healing: Secondary | ICD-10-CM | POA: Diagnosis not present

## 2023-01-02 DIAGNOSIS — J449 Chronic obstructive pulmonary disease, unspecified: Secondary | ICD-10-CM | POA: Insufficient documentation

## 2023-01-02 NOTE — Patient Instructions (Signed)
As the weather changes and gets cooler, you may notice you are affected more. You may have more pain in your joints. This is normal. Dress warmly and make sure that area is covered well.   Aspercreme, Biofreeze, Blue Emu or Voltaren Gel over the counter 2-3 times daily. Rub into area well each use for best results.  Dr.Keeling is here all day on Tuesdays. He is here half a day on Wednesday mornings, and Thursday mornings. If you need anything such as a medication refill, please either call BEFORE the end of the day on [facility] or send a message through [employer]. Your pharmacy can send a refill request for you. Calling by the end of the day on [facility] allows [place] time to send Dr.Keeling the request and for him to respond before he leaves on Thursdays.    ROV as needed

## 2023-01-02 NOTE — Progress Notes (Signed)
My thumb is fine.  She has no pain of the left thumb and good motion.  NV intact.  X-rays were done of the left thumb, reported separately.  Encounter Diagnosis  Name Primary?   Closed displaced fracture of distal phalanx of left thumb with routine healing, subsequent encounter Yes   I will see prn.  Call if any problem.  Precautions discussed.  Electronically Signed Sanjuana Kava, MD 1/4/20249:14 AM

## 2023-02-17 NOTE — Progress Notes (Unsigned)
Judy Leonard, female    DOB: 28-Jul-1966    MRN: [id number]   Brief patient profile:  17 yobf active smoker   referred to pulmonary clinic in [place]  11/14/2022 by Rubin Payor  NP for copd eval with onset of symptoms 2019 with abn pfts .  History of Present Illness  11/14/2022  Pulmonary/ 1st office eval/ Judy Leonard / [place] [facility] advair 100 one daily  / dulera 100 each pm with poor hfa technique Chief Complaint  Patient presents with   Consult    Consult for SOB with exertion   Dyspnea:  pushing basket basket at [employer]  Cough: smoker's rattle, worse  in am clears with 1-2 coughs /mucoid Sleep: once a week, waking up choking x sev years  SABA use: avg 3 x daily / never prechallenges  02: none  Lung cancer screen: rec per PCP  Rec Plan A = Automatic = Always=    Dulera 100 Take 2 puffs first thing in am and then another 2 puffs about 12 hours later.  Plan B = Backup (to supplement plan A, not to replace it) Only use your albuterol inhaler as a rescue medication Also  Ok to try albuterol 15 min before an activity (on alternating days)  that you know would usually make you short of breath Stop fluticasone inhaler  Please schedule a follow up office visit in 6 weeks, call sooner if needed - bring inhalers    12/27/2022  f/u ov/[place] office/Judy Leonard re: AB maint on dulera 100  2 bid   Chief Complaint  Patient presents with   Follow-up    Still coughing up phlegm (clear) during the night   Dyspnea:  pushing cart at [employer]  Cough: clear mucus/ no better on dulera  Sleeping: still waking up 2-5 am  3-4 nights a week due t cough  SABA use: 4x daily  02: none   Rec Prednisone 10 mg take  4 each am x 2 days,   2 each am x 2 days,  1 each am x 2 days and stop  For cough > mucinex dm 1200 mg every 12 hours as needed  The key is to stop smoking completely before smoking completely stops you! Continue dulera 100 Take 2 puffs first thing in am and then another 2 puffs  about 12 hours later.   Work on inhaler technique:  Only use your albuterol as a rescue medication to be used if you can't catch your breath Also  Ok to try albuterol 15 min before an activity (on alternating days)  that you know would usually make you short of breath and see if it makes any difference and if makes none then don't take albuterol after activity unless you can't catch your breath as this means it's the resting that helps, not the albuterol.  Pantoprazole (protonix) 40 mg   Take  30-60 min before first meal of the day and Pepcid (famotidine)  20 mg an hour before bed until return to office - this is the best way to tell whether stomach acid is contributing to your problem.   GERD diet   Please remember to go to the  x-ray department  @  [facility] for your tests - we will call you with the results when they are available     Please schedule a follow up office visit in 6 weeks in [place] office if at all possible      02/18/2023  f/u ov/Judy Leonard re: ***   maint on ***  No chief complaint on file.   Dyspnea:  *** Cough: *** Sleeping: *** SABA use: *** 02: *** Covid status:   *** Lung cancer screening :  ***    No obvious day to day or daytime variability or assoc excess/ purulent sputum or mucus plugs or hemoptysis or cp or chest tightness, subjective wheeze or overt sinus or hb symptoms.   *** without nocturnal  or early am exacerbation  of respiratory  c/o's or need for noct saba. Also denies any obvious fluctuation of symptoms with weather or environmental changes or other aggravating or alleviating factors except as outlined above   No unusual exposure hx or h/o childhood pna/ asthma or knowledge of premature birth.  Current Allergies, Complete Past Medical History, Past Surgical History, Family History, and Social History were reviewed in [employer] record.  ROS  The following are not active complaints unless bolded Hoarseness, sore  throat, dysphagia, dental problems, itching, sneezing,  nasal congestion or discharge of excess mucus or purulent secretions, ear ache,   fever, chills, sweats, unintended wt loss or wt gain, classically pleuritic or exertional cp,  orthopnea pnd or arm/hand swelling  or leg swelling, presyncope, palpitations, abdominal pain, anorexia, nausea, vomiting, diarrhea  or change in bowel habits or change in bladder habits, change in stools or change in urine, dysuria, hematuria,  rash, arthralgias, visual complaints, headache, numbness, weakness or ataxia or problems with walking or coordination,  change in mood or  memory.        No outpatient medications have been marked as taking for the 02/18/23 encounter (Appointment) with Tanda Rockers, MD.           Past Medical History:  Diagnosis Date   Arthritis    Asthma    Hypertension        Objective:    wts   02/18/2023       ***  12/27/2022     202   11/14/22 207 lb 6.4 oz (94.1 kg)  03/18/22 193 lb (87.5 kg)  09/13/21 199 lb 9.6 oz (90.5 kg)    Vital signs reviewed  02/18/2023  - Note at rest 02 sats  ***% on ***   General appearance:    ***  Min bar ***          I personally reviewed images and agree with radiology impression as follows:  CXR:   01/02/23 pa and lat Mild hyperinflation with minimal atelectasis versus scarring at lung bases.  No acute infiltrate.      Assessment

## 2023-02-18 ENCOUNTER — Encounter: Payer: Self-pay | Admitting: Internal Medicine

## 2023-02-18 ENCOUNTER — Ambulatory Visit (INDEPENDENT_AMBULATORY_CARE_PROVIDER_SITE_OTHER): Payer: 59 | Admitting: Internal Medicine

## 2023-02-18 VITALS — BP 102/72 | HR 89 | Temp 97.8°F | Ht 66.5 in | Wt 206.0 lb

## 2023-02-18 DIAGNOSIS — J449 Chronic obstructive pulmonary disease, unspecified: Secondary | ICD-10-CM

## 2023-02-18 DIAGNOSIS — F17210 Nicotine dependence, cigarettes, uncomplicated: Secondary | ICD-10-CM

## 2023-02-18 MED ORDER — PREDNISONE 10 MG PO TABS: ORAL_TABLET | ORAL | 0 refills | Status: DC

## 2023-02-18 NOTE — Patient Instructions (Addendum)
My office will be contacting you by phone for referral to lung cancer screening program   - if you don't hear back from my office within one week please call us back or notify us thru MyChart and we'll address it right away.   Prednisone 10 mg take  4 each am x 2 days,   2 each am x 2 days,  1 each am x 2 days and stop   For cough > mucinex dm 1200 mg every 12 hours as needed   The key is to stop smoking completely before smoking completely stops you!  Continue dulera 100 Take 2 puffs first thing in am and then another 2 puffs about 12 hours later.    Work on inhaler technique:  relax and gently blow all the way out then take a nice smooth full deep breath back in, triggering the inhaler at same time you start breathing in.  Hold breath in for at least  5 seconds if you can. Blow out [employer] out  thru nose. Rinse and gargle with water when done.  If mouth or throat bother you at all,  try brushing teeth/gums/tongue with arm and hammer toothpaste/ make a slurry and gargle and spit out.   Only use your albuterol as a rescue medication to be used if you can't catch your breath by resting or doing a relaxed purse lip breathing pattern.  - The less you use it, the better it will work when you need it. - Ok to use up to 2 puffs  every 4 hours if you must but call for immediate appointment if use goes up over your usual need - Don't leave home without it !!  (think of it like the spare tire for your car)   Also  Ok to try albuterol 15 min before an activity (on alternating days)  that you know would usually make you short of breath and see if it makes any difference and if makes none then don't take albuterol after activity unless you can't catch your breath as this means it's the resting that helps, not the albuterol.      Pantoprazole (protonix) 40 mg   Take  30-60 min before first meal of the day and Pepcid (famotidine)  20 mg after supper  until return to office - this is the best way to tell  whether stomach acid is contributing to your problem.    GERD (REFLUX)  is an extremely common cause of respiratory symptoms just like yours , many times with no obvious heartburn at all.    It can be treated with medication, but also with lifestyle changes including elevation of the head of your bed (ideally with 6 -8inch blocks under the headboard of your bed),  Smoking cessation, avoidance of late meals, excessive alcohol, and avoid fatty foods, chocolate, peppermint, colas, red wine, and acidic juices such as orange juice.  NO MINT OR MENTHOL PRODUCTS SO NO COUGH DROPS  USE SUGARLESS CANDY INSTEAD (Jolley ranchers or Stover's or Life Savers) or even ice chips will also do - the key is to swallow to prevent all throat clearing. NO OIL BASED VITAMINS - use powdered substitutes.  Avoid fish oil when coughing.     Please schedule a follow up office visit in 6 weeks, call sooner if needed with all medications /inhalers/ solutions in hand so we can verify exactly what you are taking. This includes all medications from all doctors and over the counters  - [place]  office

## 2023-02-18 NOTE — Assessment & Plan Note (Signed)
Counseled re importance of smoking cessation but did not meet time criteria for separate billing    Each maintenance medication was reviewed in detail including emphasizing most importantly the difference between maintenance and prns and under what circumstances the prns are to be triggered using an action plan format where appropriate.  Total time for H and P, chart review, counseling, reviewing hfa device(s) , directly observing portions of ambulatory 02 saturation study/ and generating customized AVS unique to this office visit / same day charting > 30 min for multiple  refractory respiratory  symptoms

## 2023-02-18 NOTE — Assessment & Plan Note (Addendum)
Active smoker  -  Spirometry 10/17/22 digital readout blurred,  f/v curve not concave  FEV1 1.39 (58%)  ratio 0.55 - 11/14/2022   Walked on RA   x  3  lap(s) =  approx 450  ft  @ mod pace, stopped due to end of study  with lowest 02 sats 94% with sob p one lap but never stopped - 11/14/2022  After extensive coaching inhaler device,  effectiveness =    75% short Ti late trigger > dulera 100 Take 2 puffs first thing in am and then another 2 puffs about 12 hours later.  - 12/27/2022   Walked on RA  x  3  lap(s) =  approx 450  ft  @ mod pace, stopped due to end of study with lowest 02 sats 95% and only mild sob p lap 1    - 02/18/2023  After extensive coaching inhaler device,  effectiveness =    50% (trigger late)  - 02/18/2023   Walked on RA  x  3  lap(s) =  approx 750  ft  @ mod pace, stopped due to end of study with lowest 02 sats 95% mild sob p lap 1   Main issue for now is AB component ? All related to ongoing smoking/ occult GERD or non-adherence to laba/ics rx so rec no change in rx and f/u in [place] in 6 weeks with all meds in hand using a trust but verify approach to confirm accurate Medication  Reconciliation The principal here is that until we are certain that the  patients are doing what we've asked, it makes no sense to ask them to do more.   In meantime she could have ovarian surgery if needed though she is leaning against it for now  Discussed in detail all the  indications, usual  risks and alternatives  relative to the benefits with patient who agrees to proceed with Rx as outlined.

## 2023-02-27 ENCOUNTER — Encounter: Payer: Self-pay | Admitting: Radiology

## 2023-03-01 DIAGNOSIS — N838 Other noninflammatory disorders of ovary, fallopian tube and broad ligament: Secondary | ICD-10-CM | POA: Insufficient documentation

## 2023-03-01 DIAGNOSIS — F419 Anxiety disorder, unspecified: Secondary | ICD-10-CM | POA: Insufficient documentation

## 2023-04-06 NOTE — Progress Notes (Signed)
Judy Leonard, female    DOB: 1966/06/07    MRN: [id number]  Brief patient profile:  63   yobf active smoker  referred to pulmonary clinic in [place]  11/14/2022 by Judy Caroli  NP for copd eval with onset of symptoms 2019 with abn pfts .  History of Present Illness  11/14/2022  Pulmonary/ 1st office eval/ Judy Leonard / [place] [facility] advair 100 one daily  / dulera 100 each pm with poor hfa technique Chief Complaint  Patient presents with   Consult    Consult for SOB with exertion   Dyspnea:  pushing basket basket at [employer]  Cough: smoker's rattle, worse  in am clears with 1-2 coughs /mucoid Sleep: once a week, waking up choking x sev years  SABA use: avg 3 x daily / never prechallenges  02: none  Lung cancer screen: rec per PCP  Rec Plan A = Automatic = Always=    Dulera 100 Take 2 puffs first thing in am and then another 2 puffs about 12 hours later.  Plan B = Backup (to supplement plan A, not to replace it) Only use your albuterol inhaler as a rescue medication Also  Ok to try albuterol 15 min before an activity (on alternating days)  that you know would usually make you short of breath Stop fluticasone inhaler  Please schedule a follow up office visit in 6 weeks, call sooner if needed - bring inhalers    12/27/2022  f/u ov/[place] office/Judy Leonard re: AB maint on dulera 100  2 bid   Chief Complaint  Patient presents with   Follow-up    Still coughing up phlegm (clear) during the night   Dyspnea:  pushing cart at [employer]  Cough: clear mucus/ no better on dulera  Sleeping: still waking up 2-5 am  3-4 nights a week due t cough  SABA use: 4x daily  02: none   Rec Prednisone 10 mg take  4 each am x 2 days,   2 each am x 2 days,  1 each am x 2 days and stop  For cough > mucinex dm 1200 mg every 12 hours as needed  The key is to stop smoking completely before smoking completely stops you! Continue dulera 100 Take 2 puffs first thing in am and then another 2 puffs about  12 hours later.   Work on inhaler technique:  Only use your albuterol as a rescue medication to be used if you can't catch your breath Also  Ok to try albuterol 15 min before an activity (on alternating days)  that you know would usually make you short of breath and see if it makes any difference and if makes none then don't take albuterol after activity unless you can't catch your breath as this means it's the resting that helps, not the albuterol.  Pantoprazole (protonix) 40 mg   Take  30-60 min before first meal of the day and Pepcid (famotidine)  20 mg an hour before bed until return to office - this is the best way to tell whether stomach acid is contributing to your problem.   GERD diet         02/18/2023  f/u ov/Judy Leonard re: GOLD 2/AB   maint on Dulera 100  2 bid but poor hfa / preop clearance for possible ovarian surgery needed  Chief Complaint  Patient presents with   Follow-up    Some SOB, cough and wheeze persistent.  Dyspnea:  easier to push cart  at [employer]/ [employer] / across parking lot fine  Cough: worse p supper and hs  Sleeping:  45 degrees  SABA use: no better p saba but hfa poor   02: none  Rec My office will be contacting you by phone for referral to lung cancer screening program   > not done as of 04/07/2023  Prednisone 10 mg take  4 each am x 2 days,   2 each am x 2 days,  1 each am x 2 days and stop  For cough > mucinex dm 1200 mg every 12 hours as needed  The key is to stop smoking completely before smoking completely stops you! Continue dulera 100  Only use your albuterol as a rescue medication Also  Ok to try albuterol 15 min before an activity (on alternating days)  that you know would usually make you short of breath     Pantoprazole (protonix) 40 mg   Take  30-60 min before first meal of the day and Pepcid (famotidine)  20 mg after supper  until return to office  GERD diet reviewed, bed blocks rec   Please schedule a follow up office visit in 6 weeks, call  sooner if needed with all medications /inhalers/ solutions in hand    04/07/2023  post hosp f/u ov/[facility]y [facility] office/Judy Leonard re: GOLD 2 AB   on prn saba  and not using dulera correctly as maint  Chief Complaint  Patient presents with   Follow-up    Pt f/u was admitted @ [facility] from 4/2-4/5 states that she is feeling better now.   Dyspnea:  back to baseline  Cough: rattling in am / still smoking but no purulent sputum Sleeping: flat bed/ 2 pillows  SABA use: 3-4 x per day/ one neb  in evening no maint rx  02: none    Lung cancer screening: CTa 02/2023    No obvious day to day or daytime variability or assoc purulent sputum or mucus plugs or hemoptysis or cp or chest tightness, subjective wheeze or overt sinus or hb symptoms.   Sleeping  without nocturnal  or early am exacerbation  of respiratory  c/o's or need for noct saba. Also denies any obvious fluctuation of symptoms with weather or environmental changes or other aggravating or alleviating factors except as outlined above   No unusual exposure hx or h/o childhood pna/ asthma or knowledge of premature birth.  Current Allergies, Complete Past Medical History, Past Surgical History, Family History, and Social History were reviewed in [employer] record.  ROS  The following are not active complaints unless bolded Hoarseness, sore throat, dysphagia, dental problems, itching, sneezing,  nasal congestion or discharge of excess mucus or purulent secretions, ear ache,   fever, chills, sweats, unintended wt loss or wt gain, classically pleuritic or exertional cp,  orthopnea pnd or arm/hand swelling  or leg swelling, presyncope, palpitations, abdominal pain, anorexia, nausea, vomiting, diarrhea  or change in bowel habits or change in bladder habits, change in stools or change in urine, dysuria, hematuria,  rash, arthralgias, visual complaints, headache, numbness, weakness or ataxia or problems with walking or coordination,  change in  mood or  memory.        Current Meds  Medication Sig   albuterol (PROVENTIL HFA) 108 (90 Base) MCG/ACT inhaler Inhale 2 puffs into the lungs every 6 (six) hours as needed for wheezing or shortness of breath.   ALPRAZolam (XANAX) 0.5 MG tablet Take 0.5 mg by mouth at  bedtime as needed for anxiety.   benzonatate (TESSALON) 100 MG capsule Take by mouth 3 (three) times daily as needed for cough.   celecoxib (CELEBREX) 200 MG capsule 1 capsule with food   famotidine (PEPCID) 20 MG tablet One hour before bed   losartan-hydrochlorothiazide (HYZAAR) 100-25 MG tablet Take 1 tablet by mouth daily.   mometasone-formoterol (DULERA) 100-5 MCG/ACT AERO Take 2 puffs first thing in am and then another 2 puffs about 12 hours later.   pantoprazole (PROTONIX) 40 MG tablet Take 1 tablet (40 mg total) by mouth daily. Take 30-60 min before first meal of the day   tiZANidine (ZANAFLEX) 4 MG tablet Take 1 tablet (4 mg total) by mouth at bedtime as needed for muscle spasms.   [DISCONTINUED] predniSONE (DELTASONE) 10 MG tablet Take  4 each am x 2 days,   2 each am x 2 days,  1 each am x 2 days and stop                Past Medical History:  Diagnosis Date   Arthritis    Asthma    Hypertension        Objective:    wts   04/07/2023         208  02/18/2023       206  12/27/2022     202   11/14/22 207 lb 6.4 oz (94.1 kg)  03/18/22 193 lb (87.5 kg)  09/13/21 199 lb 9.6 oz (90.5 kg)    Vital signs reviewed  04/07/2023  - Note at rest 02 sats  93% on RA   General appearance:    amb bf nad     HEENT : Oropharynx  clear  Nasal turbinates nl    NECK :  without  apparent JVD/ palpable Nodes/TM   LUNGS: no acc muscle use,  Min barrel  contour chest wall with bilateral  slightly decreased bs s audible wheeze and  without cough on insp or exp maneuvers and min  Hyperresonant  to  percussion bilaterally    CV:  RRR  no s3 or murmur or increase in P2, and no edema   ABD:  soft and nontender    MS:  Nl  gait/ ext warm without deformities Or obvious joint restrictions  calf tenderness, cyanosis or clubbing     SKIN: warm and dry without lesions    NEURO:  alert, approp, nl sensorium with  no motor or cerebellar deficits apparent.             I personally reviewed images and agree with radiology impression as follows:   Chest CTa   03/01/23   Moderate centrilobular changes prominent in the upper lobes. No  evidence of pneumonia or pulmonary edema.       Assessment

## 2023-04-07 ENCOUNTER — Encounter: Payer: Self-pay | Admitting: Internal Medicine

## 2023-04-07 ENCOUNTER — Ambulatory Visit (INDEPENDENT_AMBULATORY_CARE_PROVIDER_SITE_OTHER): Payer: 59 | Admitting: Internal Medicine

## 2023-04-07 VITALS — BP 115/75 | HR 81 | Ht 66.0 in | Wt 208.6 lb

## 2023-04-07 DIAGNOSIS — J449 Chronic obstructive pulmonary disease, unspecified: Secondary | ICD-10-CM

## 2023-04-07 DIAGNOSIS — F17210 Nicotine dependence, cigarettes, uncomplicated: Secondary | ICD-10-CM | POA: Diagnosis not present

## 2023-04-07 MED ORDER — BREZTRI AEROSPHERE 160-9-4.8 MCG/ACT IN AERO: 2.0000 | INHALATION_SPRAY | Freq: Two times a day (BID) | RESPIRATORY_TRACT | 6 refills | Status: DC

## 2023-04-07 MED ORDER — BREZTRI AEROSPHERE 160-9-4.8 MCG/ACT IN AERO
INHALATION_SPRAY | RESPIRATORY_TRACT | 11 refills | Status: DC
Start: 2023-04-07 — End: 2024-05-17

## 2023-04-07 NOTE — Patient Instructions (Signed)
Plan A = Automatic = Always=    Breztri Take 2 puffs (or Symbicort 160) first thing in am and then another 2 puffs about 12 hours later.   Work on inhaler technique:  relax and gently blow all the way out then take a nice smooth full deep breath back in, triggering the inhaler at same time you start breathing in.  Hold breath in for at least  5 seconds if you can. Blow out breztri  thru nose. Rinse and gargle with water when done.  If mouth or throat bother you at all,  try brushing teeth/gums/tongue with arm and hammer toothpaste/ make a slurry and gargle and spit out.      Plan B = Backup (to supplement plan A, not to replace it) Only use your albuterol inhaler as a rescue medication to be used if you can't catch your breath by resting or doing a relaxed purse lip breathing pattern.  - The less you use it, the better it will work when you need it. - Ok to use the inhaler up to 2 puffs  every 4 hours if you must but call for appointment if use goes up over your usual need - Don't leave home without it !!  (think of it like the spare tire for your car)   Plan C = Crisis (instead of Plan B but only if Plan B stops working) - only use your albuterol nebulizer if you first try Plan B and it fails to help > ok to use the nebulizer up to every 4 hours but if start needing it regularly call for immediate appointment   Please schedule a follow up office visit in 6 weeks, call sooner if needed

## 2023-04-08 ENCOUNTER — Encounter: Payer: Self-pay | Admitting: Internal Medicine

## 2023-04-08 MED ORDER — ALBUTEROL SULFATE (2.5 MG/3ML) 0.083% IN NEBU: 2.5000 mg | INHALATION_SOLUTION | RESPIRATORY_TRACT | 12 refills | Status: DC | PRN

## 2023-04-08 NOTE — Assessment & Plan Note (Signed)
Counseled re importance of smoking cessation but did not meet time criteria for separate billing    Each maintenance medication was reviewed in detail including emphasizing most importantly the difference between maintenance and prns and under what circumstances the prns are to be triggered using an action plan format where appropriate.  Total time for H and P, chart review, counseling, reviewing hfa/neb  device(s) and generating customized AVS unique to this office visit / same day charting  > 30 min post hosp f/u ov

## 2023-04-08 NOTE — Assessment & Plan Note (Signed)
Active smoker  -  Spirometry 10/17/22 digital readout blurred,  f/v curve not concave  FEV1 1.39 (58%)  ratio 0.55 - 11/14/2022   Walked on RA   x  3  lap(s) =  approx 450  ft  @ mod pace, stopped due to end of study  with lowest 02 sats 94% with sob p one lap but never stopped - 11/14/2022  After extensive coaching inhaler device,  effectiveness =    75% short Ti late trigger > dulera 100 Take 2 puffs first thing in am and then another 2 puffs about 12 hours later.  - 12/27/2022   Walked on RA  x  3  lap(s) =  approx 450  ft  @ mod pace, stopped due to end of study with lowest 02 sats 95% and only mild sob p lap 1  - 02/18/2023  After extensive coaching inhaler device,  effectiveness =    50% (trigger late)  - 02/18/2023   Walked on RA  x  3  lap(s) =  approx 750  ft  @ mod pace, stopped due to end of study with lowest 02 sats 95% mild sob p lap 1   Pt is Group B in terms of symptom/risk and laba/lama therefore appropriate rx at this point >>>  breztri plus approp saba: Re SABA :  I spent extra time with pt today reviewing appropriate use of albuterol for prn use on exertion with the following points: 1) saba is for relief of sob that does not improve by walking a slower pace or resting but rather if the pt does not improve after trying this first. 2) If the pt is convinced, as many are, that saba helps recover from activity faster then it's easy to tell if this is the case by re-challenging : ie stop, take the inhaler, then p 5 minutes try the exact same activity (intensity of workload) that just caused the symptoms and see if they are substantially diminished or not after saba 3) if there is an activity that reproducibly causes the symptoms, try the saba 15 min before the activity on alternate days   If in fact the saba really does help, then fine to continue to use it prn but advised may need to look closer at the maintenance regimen being used to achieve better control of airways disease with  exertion.    >>>  ABC action plan reviewed see avs for instructions unique to this ov

## 2023-04-10 ENCOUNTER — Encounter: Payer: Self-pay | Admitting: *Deleted

## 2023-05-18 NOTE — Progress Notes (Unsigned)
Judy Leonard, female    DOB: 05/23/1966    MRN: [id number]  Brief patient profile:  58   yobf active smoker  referred to pulmonary clinic in [place]  11/14/2022 by [name]  NP for copd eval with onset of symptoms 2019 with abn pfts .  History of Present Illness  11/14/2022  Pulmonary/ 1st office eval/ Judy [facility] / [place] [facility] advair 100 one daily  / dulera 100 each pm with poor hfa technique Chief Complaint  Patient presents with   Consult    Consult for SOB with exertion   Dyspnea:  pushing basket basket at [facility]  Cough: smoker's rattle, worse  in am clears with 1-2 coughs /mucoid Sleep: once a week, waking up choking x sev years  SABA use: avg 3 x daily / never prechallenges  02: none  Lung cancer screen: rec per PCP  Rec Plan A = Automatic = Always=    Dulera 100 Take 2 puffs first thing in am and then another 2 puffs about 12 hours later.  Plan B = Backup (to supplement plan A, not to replace it) Only use your albuterol inhaler as a rescue medication Also  Ok to try albuterol 15 min before an activity (on alternating days)  that you know would usually make you short of breath Stop fluticasone inhaler  Please schedule a follow up office visit in 6 weeks, call sooner if needed - bring inhalers    12/27/2022  f/u ov/[facility] office/Judy [facility] re: AB maint on dulera 100  2 bid   Chief Complaint  Patient presents with   Follow-up    Still coughing up phlegm (clear) during the night   Dyspnea:  pushing cart at [facility]  Cough: clear mucus/ no better on dulera  Sleeping: still waking up 2-5 am  3-4 nights a week due t cough  SABA use: 4x daily  02: none   Rec Prednisone 10 mg take  4 each am x 2 days,   2 each am x 2 days,  1 each am x 2 days and stop  For cough > mucinex dm 1200 mg every 12 hours as needed  The key is to stop smoking completely before smoking completely stops you! Continue dulera 100 Take 2 puffs first thing in am and then another 2 puffs about  12 hours later.   Work on inhaler technique:  Only use your albuterol as a rescue medication to be used if you can't catch your breath Also  Ok to try albuterol 15 min before an activity (on alternating days)  that you know would usually make you short of breath and see if it makes any difference and if makes none then don't take albuterol after activity unless you can't catch your breath as this means it's the resting that helps, not the albuterol.  Pantoprazole (protonix) 40 mg   Take  30-60 min before first meal of the day and Pepcid (famotidine)  20 mg an hour before bed until return to office - this is the best way to tell whether stomach acid is contributing to your problem.   GERD diet         02/18/2023  f/u ov/[facility]y [facility] re: GOLD 2/AB   maint on Dulera 100  2 bid but poor hfa / preop clearance for possible ovarian surgery needed  Chief Complaint  Patient presents with   Follow-up    Some SOB, cough and wheeze persistent.  Dyspnea:  easier to push cart  at [facility]/ [employer] / across parking lot fine  Cough: worse p supper and hs  Sleeping:  45 degrees  SABA use: no better p saba but hfa poor   02: none  Rec My office will be contacting you by phone for referral to lung cancer screening program   > not done as of 04/07/2023  Prednisone 10 mg take  4 each am x 2 days,   2 each am x 2 days,  1 each am x 2 days and stop  For cough > mucinex dm 1200 mg every 12 hours as needed  The key is to stop smoking completely before smoking completely stops you! Continue dulera 100  Only use your albuterol as a rescue medication Also  Ok to try albuterol 15 min before an activity (on alternating days)  that you know would usually make you short of breath     Pantoprazole (protonix) 40 mg   Take  30-60 min before first meal of the day and Pepcid (famotidine)  20 mg after supper  until return to office  GERD diet reviewed, bed blocks rec   Please schedule a follow up office visit in 6 weeks, call  sooner if needed with all medications /inhalers/ solutions in hand    04/07/2023  post hosp f/u ov/[facility] office/Judy [facility] re: GOLD 2 AB   on prn saba  and not using dulera correctly as maint  Chief Complaint  Patient presents with   Follow-up    Pt f/u was admitted @ [facility] from 4/2-4/5 states that she is feeling better now.   Dyspnea:  back to baseline  Cough: rattling in am / still smoking but no purulent sputum Sleeping: flat bed/ 2 pillows  SABA use: 3-4 x per day/ one neb  in evening no maint rx  02: none  Rec Plan A = Automatic = Always=    Breztri Take 2 puffs (or Symbicort 160) first thing in am and then another 2 puffs about 12 hours later.  Work on inhaler technique: Plan B = Backup (to supplement plan A, not to replace it) Only use your albuterol inhaler as a rescue medication  Plan C = Crisis (instead of Plan B but only if Plan B stops working) - only use your albuterol nebulizer if you first try Plan B and it fails to help    05/19/2023  f/u ov/[facility] office/Judy Leonard re: *** maint on ***  No chief complaint on file.   Dyspnea:  *** Cough: *** Sleeping: *** SABA use: *** 02: *** Covid status: *** Lung cancer screening: due 02/2024    No obvious day to day or daytime variability or assoc excess/ purulent sputum or mucus plugs or hemoptysis or cp or chest tightness, subjective wheeze or overt sinus or hb symptoms.   *** without nocturnal  or early am exacerbation  of respiratory  c/o's or need for noct saba. Also denies any obvious fluctuation of symptoms with weather or environmental changes or other aggravating or alleviating factors except as outlined above   No unusual exposure hx or h/o childhood pna/ asthma or knowledge of premature birth.  Current Allergies, Complete Past Medical History, Past Surgical History, Family History, and Social History were reviewed in [facility] record.  ROS  The following are not active complaints unless  bolded Hoarseness, sore throat, dysphagia, dental problems, itching, sneezing,  nasal congestion or discharge of excess mucus or purulent secretions, ear ache,   fever, chills, sweats, unintended wt  loss or wt gain, classically pleuritic or exertional cp,  orthopnea pnd or arm/hand swelling  or leg swelling, presyncope, palpitations, abdominal pain, anorexia, nausea, vomiting, diarrhea  or change in bowel habits or change in bladder habits, change in stools or change in urine, dysuria, hematuria,  rash, arthralgias, visual complaints, headache, numbness, weakness or ataxia or problems with walking or coordination,  change in mood or  memory.        No outpatient medications have been marked as taking for the 05/19/23 encounter (Appointment) with Nyoka Cowden, MD.                 Past Medical History:  Diagnosis Date   Arthritis    Asthma    Hypertension        Objective:    wts   05/19/2023       ***  04/07/2023         208  02/18/2023       206  12/27/2022     202   11/14/22 207 lb 6.4 oz (94.1 kg)  03/18/22 193 lb (87.5 kg)  09/13/21 199 lb 9.6 oz (90.5 kg)    Vital signs reviewed  05/19/2023  - Note at rest 02 sats  ***% on ***   General appearance:    ***     Min bar***           I personally reviewed images and agree with radiology impression as follows:   Chest CTa   03/01/23   Moderate centrilobular changes prominent in the upper lobes. No  evidence of pneumonia or pulmonary edema.       Assessment

## 2023-05-19 ENCOUNTER — Ambulatory Visit (INDEPENDENT_AMBULATORY_CARE_PROVIDER_SITE_OTHER): Payer: 59 | Admitting: Internal Medicine

## 2023-05-19 ENCOUNTER — Encounter: Payer: Self-pay | Admitting: Internal Medicine

## 2023-05-19 VITALS — BP 118/64 | HR 99 | Ht 66.0 in | Wt 205.8 lb

## 2023-05-19 DIAGNOSIS — J449 Chronic obstructive pulmonary disease, unspecified: Secondary | ICD-10-CM

## 2023-05-19 DIAGNOSIS — F17210 Nicotine dependence, cigarettes, uncomplicated: Secondary | ICD-10-CM | POA: Diagnosis not present

## 2023-05-19 MED ORDER — BREZTRI AEROSPHERE 160-9-4.8 MCG/ACT IN AERO: 2.0000 | INHALATION_SPRAY | Freq: Two times a day (BID) | RESPIRATORY_TRACT | 0 refills | Status: DC

## 2023-05-19 NOTE — Assessment & Plan Note (Signed)
Active smoker  -  Spirometry 10/17/22 digital readout blurred,  f/v curve not concave  FEV1 1.39 (58%)  ratio 0.55 - 11/14/2022   Walked on RA   x  3  lap(s) =  approx 450  ft  @ mod pace, stopped due to end of study  with lowest 02 sats 94% with sob p one lap but never stopped - 11/14/2022  After extensive coaching inhaler device,  effectiveness =    75% short Ti late trigger > dulera 100 Take 2 puffs first thing in am and then another 2 puffs about 12 hours later.  - 12/27/2022   Walked on RA  x  3  lap(s) =  approx 450  ft  @ mod pace, stopped due to end of study with lowest 02 sats 95% and only mild sob p lap 1  - 02/18/2023   Walked on RA  x  3  lap(s) =  approx 750  ft  @ mod pace, stopped due to end of study with lowest 02 sats 95% mild sob p lap 1  - Chest CTa   03/01/23  Moderate centrilobular emphysema - 05/19/2023  After extensive coaching inhaler device,  effectiveness =    75% from baseline of 50% (short ti)    Group D (now reclassified as E) in terms of symptom/risk and laba/lama/ICS  therefore appropriate rx at this point >>>  breztri plus approp saba   Advised on significance of finding emphysema on L[facility]CT > only rx is stop all smoking

## 2023-05-19 NOTE — Addendum Note (Signed)
Addended by: Christen Butter on: 05/19/2023 04:20 PM   Modules accepted: Orders

## 2023-05-19 NOTE — Patient Instructions (Addendum)
Plan A = Automatic = Always=    Breztri Take 2 puffs (or Symbicort 160) first thing in am and then another 2 puffs about 12 hours later.   Work on inhaler technique:  relax and gently blow all the way out then take a nice smooth full deep breath back in, triggering the inhaler at same time you start breathing in.  Hold breath in for at least  5 seconds if you can. Blow out breztri  thru nose. Rinse and gargle with water when done.  If mouth or throat bother you at all,  try brushing teeth/gums/tongue with arm and hammer toothpaste/ make a slurry and gargle and spit out.      Plan B = Backup (to supplement plan A, not to replace it) Only use your albuterol inhaler as a rescue medication to be used if you can't catch your breath by resting or doing a relaxed purse lip breathing pattern.  - The less you use it, the better it will work when you need it. - Ok to use the inhaler up to 2 puffs  every 4 hours if you must but call for appointment if use goes up over your usual need - Don't leave home without it !!  (think of it like the spare tire for your car)   Plan C = Crisis (instead of Plan B but only if Plan B stops working) - only use your albuterol nebulizer if you first try Plan B and it fails to help > ok to use the nebulizer up to every 4 hours but if start needing it regularly call for immediate appointment  GERD (REFLUX)  is an extremely common cause of respiratory symptoms just like yours , many times with no obvious heartburn at all.    It can be treated with medication, but also with lifestyle changes including elevation of the head of your bed (ideally with 6-8inch blocks under the headboard of your bed),  Smoking cessation, avoidance of late meals, excessive alcohol, and avoid fatty foods, chocolate, peppermint, colas, red wine, and acidic juices such as orange juice.  NO MINT OR MENTHOL PRODUCTS SO NO COUGH DROPS  USE SUGARLESS CANDY INSTEAD (Jolley ranchers or Stover's or Life Savers) or  even ice chips will also do - the key is to swallow to prevent all throat clearing. NO OIL BASED VITAMINS - use powdered substitutes.  Avoid fish oil when coughing.   The key is to stop smoking completely before smoking completely stops you!  Work on inhaler technique:  relax and gently blow all the way out then take a nice smooth full deep breath back in, triggering the inhaler at same time you start breathing in.  Hold breath in for at least  5 seconds if you can. Blow out breztri out thru nose. Rinse and gargle with water when done.  If mouth or throat bother you at all,  try brushing teeth/gums/tongue with arm and hammer toothpaste/ make a slurry and gargle and spit out.       Please schedule a follow up visit in  6 months but call sooner if needed

## 2023-05-19 NOTE — Assessment & Plan Note (Addendum)
Counseled re importance of smoking cessation but did not meet time criteria for separate billing    F/u q 6 m, call sooner if needed  Each maintenance medication was reviewed in detail including emphasizing most importantly the difference between maintenance and prns and under what circumstances the prns are to be triggered using an action plan format where appropriate.  Total time for H and P, chart review, counseling, reviewing hfa device(s) and generating customized AVS unique to this office visit / same day charting = 31 min

## 2023-09-10 ENCOUNTER — Encounter: Payer: Self-pay | Admitting: Gastroenterology

## 2023-09-10 ENCOUNTER — Ambulatory Visit (INDEPENDENT_AMBULATORY_CARE_PROVIDER_SITE_OTHER): Payer: 59 | Admitting: Gastroenterology

## 2023-09-10 VITALS — BP 135/85 | HR 71 | Temp 97.8°F | Ht 66.5 in | Wt 212.4 lb

## 2023-09-10 DIAGNOSIS — R1013 Epigastric pain: Secondary | ICD-10-CM | POA: Diagnosis not present

## 2023-09-10 DIAGNOSIS — K219 Gastro-esophageal reflux disease without esophagitis: Secondary | ICD-10-CM | POA: Insufficient documentation

## 2023-09-10 DIAGNOSIS — R198 Other specified symptoms and signs involving the digestive system and abdomen: Secondary | ICD-10-CM | POA: Diagnosis not present

## 2023-09-10 DIAGNOSIS — R1310 Dysphagia, unspecified: Secondary | ICD-10-CM | POA: Insufficient documentation

## 2023-09-10 NOTE — Progress Notes (Addendum)
GI Office Note    Referring Provider: Shelby Dubin, FNP Primary Care Physician:  Shelby Dubin, FNP  Primary Gastroenterologist: Katrinka Blazing, MD   Chief Complaint   Chief Complaint  Patient presents with   Gastroesophageal Reflux    Currently on pantoprazole, started twice daily aprox. A week ago     History of Present Illness   Judy Leonard is a 57 y.o. female presenting today at the request of [name] FNP for uncontrolled GERD. She has history of chronic Hep C genotype 1a, Fibrosis score F1-F2 treated by ID in 2022 with documented SVR.   She reports that she has had acid reflux for about a year or more.  She initially was some pantoprazole 40 mg daily.  Over the past couple months her symptoms have been very difficult to control.  Recently going up to pantoprazole twice daily has made some improvement in symptoms.  She also takes a H2 blocker at bedtime.  She describes heartburn regularly.  Also notes a lot of upper abdominal bloating and discomfort.  This morning took Alka-Seltzer and had several bowel movements with relief of the symptoms.  She has some mild dysphagia in the upper esophagus/throat region but she believes this is more related to having had neck surgery within the past 2 years.  She is trying to quit smoking.  Currently wearing a patch.  Continues to have a daily bowel movement every morning but her stools have been irregular, ranging from stools to loose stools.  Some days she may have a few stools, other days none.  Denies any melena or rectal bleeding.  Also states that she chronically has some right-sided abdominal pain which she believes is related to ovarian cyst.  Noted to have bilateral ovarian cyst on CT in 2023, followed regularly since that time including ultrasound, MRI.  Most recent pelvic ultrasound in June 2024 with multiple bilateral ovarian cyst noted.   Colonoscopy April 2023, Dr. Aurea Graff: A digital rectal exam was performed.  External hemorrhoids and a       prolapsed anal papilla was seen.  A few small-mouthed diverticula were found in the sigmoid colon.  A polypoid lesion was found at the appendiceal orifice. The lesion was       polypoid. No bleeding was present. This was biopsied with a cold forceps for histology.  A 3 mm polyp was found in the hepatic flexure. The polyp was sessile.       The polyp was removed with a cold biopsy forceps. Resection and       retrieval were complete.  Four semi-pedunculated polyps were found in the recto-sigmoid colon. The       polyps were 4 to 7 mm in size. These polyps were removed with a hot       snare. Resection and retrieval were complete.  Three sessile polyps were found in the recto-sigmoid colon. The polyps       were 3 to 6 mm in size. These polyps were removed with a cold biopsy       forceps. Resection and retrieval were complete.   Per Dr. Novella Rob OV note, path showed normal tissue at appendiceal orifice, tubular adenoma at hepatic flexure, advising 5 year follow up colonoscopy    Medications   Current Outpatient Medications  Medication Sig Dispense Refill   ALPRAZolam (XANAX) 0.5 MG tablet Take 0.5 mg by mouth at bedtime as needed for anxiety.     benzonatate (TESSALON)  100 MG capsule Take by mouth 3 (three) times daily as needed for cough.     Budeson-Glycopyrrol-Formoterol (BREZTRI AEROSPHERE) 160-9-4.8 MCG/ACT AERO Take 2 puffs first thing in am and then another 2 puffs about 12 hours later. 10.7 g 11   celecoxib (CELEBREX) 200 MG capsule 1 capsule with food     ergocalciferol (VITAMIN D2) 1.25 MG (50000 UT) capsule 1 capsule Orally ONCE WEEKLY for 90 days     famotidine (PEPCID) 20 MG tablet One hour before bed 30 tablet 11   ipratropium-albuterol (DUONEB) 0.5-2.5 (3) MG/3ML SOLN Inhale into the lungs.     losartan (COZAAR) 50 MG tablet 1 tablet Orally Once a day for 30 days     pantoprazole (PROTONIX) 40 MG tablet Take 1 tablet (40 mg total) by  mouth daily. Take 30-60 min before first meal of the day (Patient taking differently: Take 40 mg by mouth 2 (two) times daily before a meal. Take 30-60 min before first meal of the day) 30 tablet 2   sertraline (ZOLOFT) 25 MG tablet 1 tablet Orally Once a day     tiZANidine (ZANAFLEX) 4 MG tablet Take 1 tablet (4 mg total) by mouth at bedtime as needed for muscle spasms.     No current facility-administered medications for this visit.    Allergies   Allergies as of 09/10/2023 - Review Complete 09/10/2023  Allergen Reaction Noted   Hydroxyzine hcl Rash 02/18/2023    Past Medical History   Past Medical History:  Diagnosis Date   Arthritis    Asthma    Hypertension     Past Surgical History   Past Surgical History:  Procedure Laterality Date   ANTERIOR CERVICAL DECOMP/DISCECTOMY FUSION N/A 07/23/2021   Procedure: Anterior Cervical Discectomy and Fusion Cervical Four-Five/Five-Six;  Surgeon: Julio Sicks, MD;  Location: [facility] OR;  Service: Neurosurgery;  Laterality: N/A;  3C   APPENDECTOMY  age 24    Past Family History   Family History  Problem Relation Age of Onset   Diabetes Mother    Kidney disease Mother    Hypertension Mother    Heart failure Mother    Prostate cancer Maternal Uncle    Throat cancer Maternal Uncle    Colon cancer Cousin    Celiac disease Neg Hx    Inflammatory bowel disease Neg Hx     Past Social History   Social History   Socioeconomic History   Marital status: Single    Spouse name: Not on file   Number of children: Not on file   Years of education: Not on file   Highest education level: Not on file  Occupational History   Not on file  Tobacco Use   Smoking status: Some Days    Current packs/day: 0.25    Average packs/day: 0.3 packs/day for 31.0 years (7.8 ttl pk-yrs)    Types: Cigarettes   Smokeless tobacco: Never   Tobacco comments:    1-2 cig days; working on quitting (taking medication prescribed by primary)    11/16- per patient  smoking about 3 cig per day     02/18/23 3 cigarettes a day.  Vaping Use   Vaping status: Never Used  Substance and Sexual Activity   Alcohol use: Yes    Comment: occasionally   Drug use: Not Currently   Sexual activity: Yes  Other Topics Concern   Not on file  Social History Narrative   Not on file   Social Determinants of Health  Financial Resource Strain: Not on file  Food Insecurity: Not on file  Transportation Needs: Not on file  Physical Activity: Not on file  Stress: Not on file  Social Connections: Not on file  Intimate Partner Violence: Not on file    Review of Systems   General: Negative for anorexia, weight loss, fever, chills, fatigue, weakness. Eyes: Negative for vision changes.  ENT: Negative for hoarseness,  nasal congestion. See hpi CV: Negative for chest pain, angina, palpitations, dyspnea on exertion, peripheral edema.  Respiratory: Negative for dyspnea at rest, dyspnea on exertion, cough, sputum, wheezing.  GI: See history of present illness. GU:  Negative for dysuria, hematuria, urinary incontinence, urinary frequency, nocturnal urination.  MS: Negative for joint pain, low back pain.  Derm: Negative for rash or itching.  Neuro: Negative for weakness, abnormal sensation, seizure, frequent headaches, memory loss,  confusion.  Psych: Negative for anxiety, depression, suicidal ideation, hallucinations.  Endo: Negative for unusual weight change.  Heme: Negative for bruising or bleeding. Allergy: Negative for rash or hives.  Physical Exam   BP 135/85 (BP Location: Right Arm, Patient Position: Sitting, Cuff Size: Normal)   Pulse 71   Temp 97.8 F (36.6 C) (Oral)   Ht 5' 6.5" (1.689 m)   Wt 212 lb 6.4 oz (96.3 kg)   LMP 04/28/2015   SpO2 97%   BMI 33.77 kg/m    General: Well-nourished, well-developed in no acute distress.  Head: Normocephalic, atraumatic.   Eyes: Conjunctiva pink, no icterus. Mouth: Oropharyngeal mucosa moist and pink  Neck:  Supple without thyromegaly, masses, or lymphadenopathy.  Lungs: Clear to auscultation bilaterally.  Heart: Regular rate and rhythm, no murmurs rubs or gallops.  Abdomen: Bowel sounds are normal,  nondistended, no hepatosplenomegaly or masses,  no abdominal bruits or hernia, no rebound or guarding.  Mild epig tenderness/right sided tenderness Rectal: not performed Extremities: No lower extremity edema. No clubbing or deformities.  Neuro: Alert and oriented x 4 , grossly normal neurologically.  Skin: Warm and dry, no rash or jaundice.   Psych: Alert and cooperative, normal mood and affect.  Labs   Labs from March 2024: Blood cell count 12,100, hemoglobin 15.5, platelets 232,000, sodium 141, potassium 2.8, creatinine 0.97, sodium 144, creatinine 0.85, albumin 2.9, total bilirubin 0.3, alkaline phosphatase 93, AST 14, ALT 17  Imaging Studies   No results found.  Assessment   *GERD *Epigastric pain *Dysphagia *Change in bowels  Difficult to manage reflux, slightly improved on twice daily PPI, H2 blocker.  Prior upper endoscopy.  With the goal to manage symptoms, abdominal pain, would recommend upper endoscopy to rule out complicated GERD, gastritis, peptic ulcer disease, large hiatal hernia.  Dysphagia may be more secondary to prior cervical disc surgery, consider esophageal dilation if warranted.  Etiology for change in bowel habits unclear.  Colonoscopy 03/2022 as outlined above with several polyps removed, polypoid tissue at the appendiceal orifice.  Patient reports prior appendectomy.  CT 2023 unremarkable with regards to liver, stomach, bowel etc.     PLAN   EGD with possible esophageal dilation with Dr. Levon Hedger.  ASA 3.  I have discussed the risks, alternatives, benefits with regards to but not limited to the risk of reaction to medication, bleeding, infection, perforation and the patient is agreeable to proceed. Written consent to be obtained. Continue pantoprazole 40 mg twice  daily before meals. Continue famotidine at bedtime. Add Benefiber 2 teaspoons daily, increase to twice daily after 2 weeks if tolerated. Continue to follow with PCP  regarding ovarian cysts. To discuss colonoscopy findings with Dr. Levon Hedger, ?timing of surveillance colonoscopy. Will retrieve copy of path results for review.   Leanna Battles. Melvyn Neth, MHS, PA-C [facility]  I have reviewed the note and agree with the APP's assessment as described in this progress note  Patient presenting persistent GERD symptoms despite taking PPI medication.  Also presenting some dysphagia after she underwent neck surgery.  We will evaluate this further with an EGD and possible dilation.  At that time we will also evaluate how much she can extend her neck, as this may preclude other potential therapies such as TIF.  Notably, the patient had a polypoid tissue in her last colonoscopy but pathology was normal.  It is unclear if this was adequately sampled or not, but we will discuss with the patient if she is willing to have repeat colonoscopy this year (hopefully at the same time of her esophagogastroduodenospy) since this will help evaluate the lesion found and sampled by Dr. Aurea Graff.  Katrinka Blazing, MD Gastroenterology and Hepatology [facility]

## 2023-09-10 NOTE — H&P (View-Only) (Signed)
GI Office Note    Referring Provider: Shelby Dubin, FNP Primary Care Physician:  Shelby Dubin, FNP  Primary Gastroenterologist: Katrinka Blazing, MD   Chief Complaint   Chief Complaint  Patient presents with   Gastroesophageal Reflux    Currently on pantoprazole, started twice daily aprox. A week ago     History of Present Illness   Judy Leonard is a 57 y.o. female presenting today at the request of [name] FNP for uncontrolled GERD. She has history of chronic Hep C genotype 1a, Fibrosis score F1-F2 treated by ID in 2022 with documented SVR.   She reports that she has had acid reflux for about a year or more.  She initially was some pantoprazole 40 mg daily.  Over the past couple months her symptoms have been very difficult to control.  Recently going up to pantoprazole twice daily has made some improvement in symptoms.  She also takes a H2 blocker at bedtime.  She describes heartburn regularly.  Also notes a lot of upper abdominal bloating and discomfort.  This morning took Alka-Seltzer and had several bowel movements with relief of the symptoms.  She has some mild dysphagia in the upper esophagus/throat region but she believes this is more related to having had neck surgery within the past 2 years.  She is trying to quit smoking.  Currently wearing a patch.  Continues to have a daily bowel movement every morning but her stools have been irregular, ranging from stools to loose stools.  Some days she may have a few stools, other days none.  Denies any melena or rectal bleeding.  Also states that she chronically has some right-sided abdominal pain which she believes is related to ovarian cyst.  Noted to have bilateral ovarian cyst on CT in 2023, followed regularly since that time including ultrasound, MRI.  Most recent pelvic ultrasound in June 2024 with multiple bilateral ovarian cyst noted.   Colonoscopy April 2023, Dr. Aurea Graff: A digital rectal exam was performed.  External hemorrhoids and a       prolapsed anal papilla was seen.  A few small-mouthed diverticula were found in the sigmoid colon.  A polypoid lesion was found at the appendiceal orifice. The lesion was       polypoid. No bleeding was present. This was biopsied with a cold forceps for histology.  A 3 mm polyp was found in the hepatic flexure. The polyp was sessile.       The polyp was removed with a cold biopsy forceps. Resection and       retrieval were complete.  Four semi-pedunculated polyps were found in the recto-sigmoid colon. The       polyps were 4 to 7 mm in size. These polyps were removed with a hot       snare. Resection and retrieval were complete.  Three sessile polyps were found in the recto-sigmoid colon. The polyps       were 3 to 6 mm in size. These polyps were removed with a cold biopsy       forceps. Resection and retrieval were complete.   Per Dr. Novella Rob OV note, path showed normal tissue at appendiceal orifice, tubular adenoma at hepatic flexure, advising 5 year follow up colonoscopy    Medications   Current Outpatient Medications  Medication Sig Dispense Refill   ALPRAZolam (XANAX) 0.5 MG tablet Take 0.5 mg by mouth at bedtime as needed for anxiety.     benzonatate (TESSALON)  100 MG capsule Take by mouth 3 (three) times daily as needed for cough.     Budeson-Glycopyrrol-Formoterol (BREZTRI AEROSPHERE) 160-9-4.8 MCG/ACT AERO Take 2 puffs first thing in am and then another 2 puffs about 12 hours later. 10.7 g 11   celecoxib (CELEBREX) 200 MG capsule 1 capsule with food     ergocalciferol (VITAMIN D2) 1.25 MG (50000 UT) capsule 1 capsule Orally ONCE WEEKLY for 90 days     famotidine (PEPCID) 20 MG tablet One hour before bed 30 tablet 11   ipratropium-albuterol (DUONEB) 0.5-2.5 (3) MG/3ML SOLN Inhale into the lungs.     losartan (COZAAR) 50 MG tablet 1 tablet Orally Once a day for 30 days     pantoprazole (PROTONIX) 40 MG tablet Take 1 tablet (40 mg total) by  mouth daily. Take 30-60 min before first meal of the day (Patient taking differently: Take 40 mg by mouth 2 (two) times daily before a meal. Take 30-60 min before first meal of the day) 30 tablet 2   sertraline (ZOLOFT) 25 MG tablet 1 tablet Orally Once a day     tiZANidine (ZANAFLEX) 4 MG tablet Take 1 tablet (4 mg total) by mouth at bedtime as needed for muscle spasms.     No current facility-administered medications for this visit.    Allergies   Allergies as of 09/10/2023 - Review Complete 09/10/2023  Allergen Reaction Noted   Hydroxyzine hcl Rash 02/18/2023    Past Medical History   Past Medical History:  Diagnosis Date   Arthritis    Asthma    Hypertension     Past Surgical History   Past Surgical History:  Procedure Laterality Date   ANTERIOR CERVICAL DECOMP/DISCECTOMY FUSION N/A 07/23/2021   Procedure: Anterior Cervical Discectomy and Fusion Cervical Four-Five/Five-Six;  Surgeon: Julio Sicks, MD;  Location: [facility] OR;  Service: Neurosurgery;  Laterality: N/A;  3C   APPENDECTOMY  age 24    Past Family History   Family History  Problem Relation Age of Onset   Diabetes Mother    Kidney disease Mother    Hypertension Mother    Heart failure Mother    Prostate cancer Maternal Uncle    Throat cancer Maternal Uncle    Colon cancer Cousin    Celiac disease Neg Hx    Inflammatory bowel disease Neg Hx     Past Social History   Social History   Socioeconomic History   Marital status: Single    Spouse name: Not on file   Number of children: Not on file   Years of education: Not on file   Highest education level: Not on file  Occupational History   Not on file  Tobacco Use   Smoking status: Some Days    Current packs/day: 0.25    Average packs/day: 0.3 packs/day for 31.0 years (7.8 ttl pk-yrs)    Types: Cigarettes   Smokeless tobacco: Never   Tobacco comments:    1-2 cig days; working on quitting (taking medication prescribed by primary)    11/16- per patient  smoking about 3 cig per day     02/18/23 3 cigarettes a day.  Vaping Use   Vaping status: Never Used  Substance and Sexual Activity   Alcohol use: Yes    Comment: occasionally   Drug use: Not Currently   Sexual activity: Yes  Other Topics Concern   Not on file  Social History Narrative   Not on file   Social Determinants of Health  Financial Resource Strain: Not on file  Food Insecurity: Not on file  Transportation Needs: Not on file  Physical Activity: Not on file  Stress: Not on file  Social Connections: Not on file  Intimate Partner Violence: Not on file    Review of Systems   General: Negative for anorexia, weight loss, fever, chills, fatigue, weakness. Eyes: Negative for vision changes.  ENT: Negative for hoarseness,  nasal congestion. See hpi CV: Negative for chest pain, angina, palpitations, dyspnea on exertion, peripheral edema.  Respiratory: Negative for dyspnea at rest, dyspnea on exertion, cough, sputum, wheezing.  GI: See history of present illness. GU:  Negative for dysuria, hematuria, urinary incontinence, urinary frequency, nocturnal urination.  MS: Negative for joint pain, low back pain.  Derm: Negative for rash or itching.  Neuro: Negative for weakness, abnormal sensation, seizure, frequent headaches, memory loss,  confusion.  Psych: Negative for anxiety, depression, suicidal ideation, hallucinations.  Endo: Negative for unusual weight change.  Heme: Negative for bruising or bleeding. Allergy: Negative for rash or hives.  Physical Exam   BP 135/85 (BP Location: Right Arm, Patient Position: Sitting, Cuff Size: Normal)   Pulse 71   Temp 97.8 F (36.6 C) (Oral)   Ht 5' 6.5" (1.689 m)   Wt 212 lb 6.4 oz (96.3 kg)   LMP 04/28/2015   SpO2 97%   BMI 33.77 kg/m    General: Well-nourished, well-developed in no acute distress.  Head: Normocephalic, atraumatic.   Eyes: Conjunctiva pink, no icterus. Mouth: Oropharyngeal mucosa moist and pink  Neck:  Supple without thyromegaly, masses, or lymphadenopathy.  Lungs: Clear to auscultation bilaterally.  Heart: Regular rate and rhythm, no murmurs rubs or gallops.  Abdomen: Bowel sounds are normal,  nondistended, no hepatosplenomegaly or masses,  no abdominal bruits or hernia, no rebound or guarding.  Mild epig tenderness/right sided tenderness Rectal: not performed Extremities: No lower extremity edema. No clubbing or deformities.  Neuro: Alert and oriented x 4 , grossly normal neurologically.  Skin: Warm and dry, no rash or jaundice.   Psych: Alert and cooperative, normal mood and affect.  Labs   Labs from March 2024: Blood cell count 12,100, hemoglobin 15.5, platelets 232,000, sodium 141, potassium 2.8, creatinine 0.97, sodium 144, creatinine 0.85, albumin 2.9, total bilirubin 0.3, alkaline phosphatase 93, AST 14, ALT 17  Imaging Studies   No results found.  Assessment   *GERD *Epigastric pain *Dysphagia *Change in bowels  Difficult to manage reflux, slightly improved on twice daily PPI, H2 blocker.  Prior upper endoscopy.  With the goal to manage symptoms, abdominal pain, would recommend upper endoscopy to rule out complicated GERD, gastritis, peptic ulcer disease, large hiatal hernia.  Dysphagia may be more secondary to prior cervical disc surgery, consider esophageal dilation if warranted.  Etiology for change in bowel habits unclear.  Colonoscopy 03/2022 as outlined above with several polyps removed, polypoid tissue at the appendiceal orifice.  Patient reports prior appendectomy.  CT 2023 unremarkable with regards to liver, stomach, bowel etc.     PLAN   EGD with possible esophageal dilation with Dr. Levon Hedger.  ASA 3.  I have discussed the risks, alternatives, benefits with regards to but not limited to the risk of reaction to medication, bleeding, infection, perforation and the patient is agreeable to proceed. Written consent to be obtained. Continue pantoprazole 40 mg twice  daily before meals. Continue famotidine at bedtime. Add Benefiber 2 teaspoons daily, increase to twice daily after 2 weeks if tolerated. Continue to follow with PCP  regarding ovarian cysts. To discuss colonoscopy findings with Dr. Levon Hedger, ?timing of surveillance colonoscopy. Will retrieve copy of path results for review.   Judy Leonard, MHS, PA-C [facility]  I have reviewed the note and agree with the APP's assessment as described in this progress note  Patient presenting persistent GERD symptoms despite taking PPI medication.  Also presenting some dysphagia after she underwent neck surgery.  We will evaluate this further with an EGD and possible dilation.  At that time we will also evaluate how much she can extend her neck, as this may preclude other potential therapies such as TIF.  Notably, the patient had a polypoid tissue in her last colonoscopy but pathology was normal.  It is unclear if this was adequately sampled or not, but we will discuss with the patient if she is willing to have repeat colonoscopy this year (hopefully at the same time of her esophagogastroduodenospy) since this will help evaluate the lesion found and sampled by Dr. Aurea Graff.  Katrinka Blazing, MD Gastroenterology and Hepatology [facility]

## 2023-09-10 NOTE — Patient Instructions (Signed)
Continue pantoprazole and famotidine as before. Add Benefiber 2 teaspoons daily. After two weeks, increase to twice daily. Let me know if your stools do not improve. Upper endoscopy to be scheduled. See separate instructions.

## 2023-09-11 ENCOUNTER — Telehealth: Payer: Self-pay | Admitting: Gastroenterology

## 2023-09-11 NOTE — Telephone Encounter (Signed)
Will add to schedule once we receive future schedule

## 2023-09-11 NOTE — Telephone Encounter (Signed)
Discussed with Dr. Levon Hedger. He is advising colonoscopy at time of EGD/ED.   Her colonoscopy 03/2022 showed polypoid tissue at appendiceal orifice.  Dr. Aurea Graff who completed colonoscopy did take biopsies, reportedly normal per her note.  Dr. Levon Hedger is concerned if the area at the appendix was adequately sampled or not and is recommending repeat colonoscopy at time of EGD if patient is willing.  Discussed with patient today.  She is agreeable with moving forward with colonoscopy at time of EGD/EGD.  Diagnosis for colonoscopy, right lower quadrant pain, abnormal polypoid tissue at the appendiceal orifice, history of adenomatous colon polyps.

## 2023-09-17 ENCOUNTER — Encounter: Payer: Self-pay | Admitting: *Deleted

## 2023-09-17 MED ORDER — PEG 3350-KCL-NA BICARB-NACL 420 G PO SOLR: 4000.0000 mL | Freq: Once | ORAL | 0 refills | Status: AC

## 2023-09-17 NOTE — Addendum Note (Signed)
Addended by: Armstead Peaks on: 09/17/2023 08:27 AM   Modules accepted: Orders

## 2023-09-17 NOTE — Telephone Encounter (Signed)
Spoke with pt. Scheduled for 10/1. Aware will send instructions via mychart. Rx for prep sent to pharmacy. Will call back with pre-op appt.

## 2023-09-23 NOTE — Patient Instructions (Addendum)
Judy Leonard  09/23/2023     @PREFPERIOPPHARMACY @   Your procedure is scheduled on  09/30/2023.   Report to [facility] at  0730  A.M.   Call this number if you have problems the morning of surgery:  [phone]  If you experience any cold or flu symptoms such as cough, fever, chills, shortness of breath, etc. between now and your scheduled surgery, please notify us at the above number.   Remember:  Follow the diet and prep instructions given to you by the office.     Use your nebulizer and your inhalers before you come and bring your rescue inhaler with you.     Take these medicines the morning of surgery with A SIP OF WATER                               pantoprazole, sertraline.     Do not wear jewelry, make-up or nail polish, including gel polish,  artificial nails, or any other type of covering on natural nails (fingers and  toes).  Do not wear lotions, powders, or perfumes, or deodorant.  Do not shave 48 hours prior to surgery.  Men may shave face and neck.  Do not bring valuables to the hospital.  [facility] is not responsible for any belongings or valuables.  Contacts, dentures or bridgework may not be worn into surgery.  Leave your suitcase in the car.  After surgery it may be brought to your room.  For patients admitted to the hospital, discharge time will be determined by your treatment team.  Patients discharged the day of surgery will not be allowed to drive home and must have someone with them for 24 hours.    Special instructions:   DO NOT smoke tobacco or vape for 24 hours before your procedure.  Please read over the following fact sheets that you were given. Anesthesia Post-op Instructions and Care and Recovery After Surgery        Upper Endoscopy, Adult, Care After After the procedure, it is common to have a sore throat. It is also common to have: Mild stomach pain or discomfort. Bloating. Nausea. Follow these  instructions at home: The instructions below may help you care for yourself at home. Your health care provider may give you more instructions. If you have questions, ask your health care provider. If you were given a sedative during the procedure, it can affect you for several hours. Do not drive or operate machinery until your health care provider says that it is safe. If you will be going home right after the procedure, plan to have a responsible adult: Take you home from the hospital or clinic. You will not be allowed to drive. Care for you for the time you are told. Follow instructions from your health care provider about what you may eat and drink. Return to your normal activities as told by your health care provider. Ask your health care provider what activities are safe for you. Take over-the-counter and prescription medicines only as told by your health care provider. Contact a health care provider if you: Have a sore throat that lasts longer than one day. Have trouble swallowing. Have a fever. Get help right away if you: Vomit blood or your vomit looks like coffee grounds. Have bloody, black, or tarry stools. Have a very bad sore throat or you cannot swallow.  Have difficulty breathing or very bad pain in your chest or abdomen. These symptoms may be an emergency. Get help right away. Call 911. Do not wait to see if the symptoms will go away. Do not drive yourself to the hospital. Summary After the procedure, it is common to have a sore throat, mild stomach discomfort, bloating, and nausea. If you were given a sedative during the procedure, it can affect you for several hours. Do not drive until your health care provider says that it is safe. Follow instructions from your health care provider about what you may eat and drink. Return to your normal activities as told by your health care provider. This information is not intended to replace advice given to you by your health care  provider. Make sure you discuss any questions you have with your health care provider. Document Revised: 03/27/2022 Document Reviewed: 03/27/2022 Elsevier Patient Education  2024 [employer] Esophageal Dilatation Esophageal dilatation, also called esophageal dilation, is a procedure to widen or open a blocked or narrowed part of the esophagus. The esophagus is the part of the body that moves food and liquid from the mouth to the stomach. You may need this procedure if: You have a buildup of scar tissue in your esophagus that makes it difficult, painful, or impossible to swallow. This can be caused by gastroesophageal reflux disease (GERD). You have cancer of the esophagus. There is a problem with how food moves through your esophagus. In some cases, you may need this procedure repeated at a later time to dilate the esophagus gradually. Tell a health care provider about: Any allergies you have. All medicines you are taking, including vitamins, herbs, eye drops, creams, and over-the-counter medicines. Any problems you or family members have had with anesthetic medicines. Any blood disorders you have. Any surgeries you have had. Any medical conditions you have. Any antibiotic medicines you are required to take before dental procedures. Whether you are pregnant or may be pregnant. What are the risks? Generally, this is a safe procedure. However, problems may occur, including: Bleeding due to a tear in the lining of the esophagus. A hole, or perforation, in the esophagus. What happens before the procedure? Ask your health care provider about: Changing or stopping your regular medicines. This is especially important if you are taking diabetes medicines or blood thinners. Taking medicines such as aspirin and ibuprofen. These medicines can thin your blood. Do not take these medicines unless your health care provider tells you to take them. Taking over-the-counter medicines, vitamins, herbs, and  supplements. Follow instructions from your health care provider about eating or drinking restrictions. Plan to have a responsible adult take you home from the hospital or clinic. Plan to have a responsible adult care for you for the time you are told after you leave the hospital or clinic. This is important. What happens during the procedure? You may be given a medicine to help you relax (sedative). A numbing medicine may be sprayed into the back of your throat, or you may gargle the medicine. Your health care provider may perform the dilatation using various surgical instruments, such as: Simple dilators. This instrument is carefully placed in the esophagus to stretch it. Guided wire bougies. This involves using an endoscope to insert a wire into the esophagus. A dilator is passed over this wire to enlarge the esophagus. Then the wire is removed. Balloon dilators. An endoscope with a small balloon is inserted into the esophagus. The balloon is inflated to stretch the  esophagus and open it up. The procedure may vary among health care providers and hospitals. What can I expect after the procedure? Your blood pressure, heart rate, breathing rate, and blood oxygen level will be monitored until you leave the hospital or clinic. Your throat may feel slightly sore and numb. This will get better over time. You will not be allowed to eat or drink until your throat is no longer numb. When you are able to drink, urinate, and sit on the edge of the bed without nausea or dizziness, you may be able to return home. Follow these instructions at home: Take over-the-counter and prescription medicines only as told by your health care provider. If you were given a sedative during the procedure, it can affect you for several hours. Do not drive or operate machinery until your health care provider says that it is safe. Plan to have a responsible adult care for you for the time you are told. This is important. Follow  instructions from your health care provider about any eating or drinking restrictions. Do not use any products that contain nicotine or tobacco, such as cigarettes, e-cigarettes, and chewing tobacco. If you need help quitting, ask your health care provider. Keep all follow-up visits. This is important. Contact a health care provider if: You have a fever. You have pain that is not relieved by medicine. Get help right away if: You have chest pain. You have trouble breathing. You have trouble swallowing. You vomit blood. You have black, tarry, or bloody stools. These symptoms may represent a serious problem that is an emergency. Do not wait to see if the symptoms will go away. Get medical help right away. Call your local emergency services (911 in the [place] Do not drive yourself to the hospital. Summary Esophageal dilatation, also called esophageal dilation, is a procedure to widen or open a blocked or narrowed part of the esophagus. Plan to have a responsible adult take you home from the hospital or clinic. For this procedure, a numbing medicine may be sprayed into the back of your throat, or you may gargle the medicine. Do not drive or operate machinery until your health care provider says that it is safe. This information is not intended to replace advice given to you by your health care provider. Make sure you discuss any questions you have with your health care provider. Document Revised: 05/03/2020 Document Reviewed: 05/03/2020 Elsevier Patient Education  2024 [employer] Colonoscopy, Adult, Care After The following information offers guidance on how to care for yourself after your procedure. Your health care provider may also give you more specific instructions. If you have problems or questions, contact your health care provider. What can I expect after the procedure? After the procedure, it is common to have: A small amount of blood in your stool for 24 hours after the  procedure. Some gas. Mild cramping or bloating of your abdomen. Follow these instructions at home: Eating and drinking  Drink enough fluid to keep your urine pale yellow. Follow instructions from your health care provider about eating or drinking restrictions. Resume your normal diet as told by your health care provider. Avoid heavy or fried foods that are hard to digest. Activity Rest as told by your health care provider. Avoid sitting for a long time without moving. Get up to take short walks every 1-2 hours. This is important to improve blood flow and breathing. Ask for help if you feel weak or unsteady. Return to your normal activities as told  by your health care provider. Ask your health care provider what activities are safe for you. Managing cramping and bloating  Try walking around when you have cramps or feel bloated. If directed, apply heat to your abdomen as told by your health care provider. Use the heat source that your health care provider recommends, such as a moist heat pack or a heating pad. Place a towel between your skin and the heat source. Leave the heat on for 20-30 minutes. Remove the heat if your skin turns bright red. This is especially important if you are unable to feel pain, heat, or cold. You have a greater risk of getting burned. General instructions If you were given a sedative during the procedure, it can affect you for several hours. Do not drive or operate machinery until your health care provider says that it is safe. For the first 24 hours after the procedure: Do not sign important documents. Do not drink alcohol. Do your regular daily activities at a slower pace than normal. Eat soft foods that are easy to digest. Take over-the-counter and prescription medicines only as told by your health care provider. Keep all follow-up visits. This is important. Contact a health care provider if: You have blood in your stool 2-3 days after the procedure. Get  help right away if: You have more than a small spotting of blood in your stool. You have large blood clots in your stool. You have swelling of your abdomen. You have nausea or vomiting. You have a fever. You have increasing pain in your abdomen that is not relieved with medicine. These symptoms may be an emergency. Get help right away. Call 911. Do not wait to see if the symptoms will go away. Do not drive yourself to the hospital. Summary After the procedure, it is common to have a small amount of blood in your stool. You may also have mild cramping and bloating of your abdomen. If you were given a sedative during the procedure, it can affect you for several hours. Do not drive or operate machinery until your health care provider says that it is safe. Get help right away if you have a lot of blood in your stool, nausea or vomiting, a fever, or increased pain in your abdomen. This information is not intended to replace advice given to you by your health care provider. Make sure you discuss any questions you have with your health care provider. Document Revised: 01/28/2023 Document Reviewed: 08/08/2021 Elsevier Patient Education  2024 [employer] Monitored Anesthesia Care, Care After The following information offers guidance on how to care for yourself after your procedure. Your health care provider may also give you more specific instructions. If you have problems or questions, contact your health care provider. What can I expect after the procedure? After the procedure, it is common to have: Tiredness. Little or no memory about what happened during or after the procedure. Impaired judgment when it comes to making decisions. Nausea or vomiting. Some trouble with balance. Follow these instructions at home: For the time period you were told by your health care provider:  Rest. Do not participate in activities where you could fall or become injured. Do not drive or use machinery. Do  not drink alcohol. Do not take sleeping pills or medicines that cause drowsiness. Do not make important decisions or sign legal documents. Do not take care of children on your own. Medicines Take over-the-counter and prescription medicines only as told by your health care provider. If  you were prescribed antibiotics, take them as told by your health care provider. Do not stop using the antibiotic even if you start to feel better. Eating and drinking Follow instructions from your health care provider about what you may eat and drink. Drink enough fluid to keep your urine pale yellow. If you vomit: Drink clear fluids slowly and in small amounts as you are able. Clear fluids include water, ice chips, low-calorie sports drinks, and fruit juice that has water added to it (diluted fruit juice). Eat light and bland foods in small amounts as you are able. These foods include bananas, applesauce, rice, lean meats, toast, and crackers. General instructions  Have a responsible adult stay with you for the time you are told. It is important to have someone help care for you until you are awake and alert. If you have sleep apnea, surgery and some medicines can increase your risk for breathing problems. Follow instructions from your health care provider about wearing your sleep device: When you are sleeping. This includes during daytime naps. While taking prescription pain medicines, sleeping medicines, or medicines that make you drowsy. Do not use any products that contain nicotine or tobacco. These products include cigarettes, chewing tobacco, and vaping devices, such as e-cigarettes. If you need help quitting, ask your health care provider. Contact a health care provider if: You feel nauseous or vomit every time you eat or drink. You feel light-headed. You are still sleepy or having trouble with balance after 24 hours. You get a rash. You have a fever. You have redness or swelling around the IV  site. Get help right away if: You have trouble breathing. You have new confusion after you get home. These symptoms may be an emergency. Get help right away. Call 911. Do not wait to see if the symptoms will go away. Do not drive yourself to the hospital. This information is not intended to replace advice given to you by your health care provider. Make sure you discuss any questions you have with your health care provider. Document Revised: 05/13/2022 Document Reviewed: 05/13/2022 Elsevier Patient Education  2024 [employer].

## 2023-09-25 ENCOUNTER — Other Ambulatory Visit: Payer: Self-pay

## 2023-09-25 ENCOUNTER — Encounter (HOSPITAL_COMMUNITY): Payer: Self-pay

## 2023-09-25 ENCOUNTER — Encounter (HOSPITAL_COMMUNITY)
Admission: RE | Admit: 2023-09-25 | Discharge: 2023-09-25 | Disposition: A | Discharge status: Home / Self Care | Payer: 59 | Source: Ambulatory Visit | Attending: Gastroenterology | Admitting: Gastroenterology

## 2023-09-25 VITALS — BP 135/85 | HR 71 | Temp 97.8°F | Resp 18 | Ht 66.5 in | Wt 212.4 lb

## 2023-09-25 DIAGNOSIS — I10 Essential (primary) hypertension: Secondary | ICD-10-CM | POA: Diagnosis not present

## 2023-09-25 DIAGNOSIS — Z01818 Encounter for other preprocedural examination: Secondary | ICD-10-CM | POA: Diagnosis present

## 2023-09-25 DIAGNOSIS — Z01810 Encounter for preprocedural cardiovascular examination: Secondary | ICD-10-CM | POA: Insufficient documentation

## 2023-09-25 DIAGNOSIS — F17210 Nicotine dependence, cigarettes, uncomplicated: Secondary | ICD-10-CM | POA: Diagnosis not present

## 2023-09-25 HISTORY — DX: Gastro-esophageal reflux disease without esophagitis: K21.9

## 2023-09-25 HISTORY — DX: Depression, unspecified: F32.A

## 2023-09-30 ENCOUNTER — Encounter (HOSPITAL_COMMUNITY)
Admission: RE | Disposition: A | Discharge status: Home / Self Care | Payer: Self-pay | Source: Home / Self Care | Attending: Gastroenterology

## 2023-09-30 ENCOUNTER — Ambulatory Visit (HOSPITAL_COMMUNITY): Payer: 59 | Admitting: Anesthesiology

## 2023-09-30 ENCOUNTER — Ambulatory Visit (HOSPITAL_COMMUNITY)
Admission: RE | Admit: 2023-09-30 | Discharge: 2023-09-30 | Disposition: A | Discharge status: Home / Self Care | Payer: 59 | Attending: Gastroenterology | Admitting: Gastroenterology

## 2023-09-30 ENCOUNTER — Encounter (HOSPITAL_COMMUNITY): Payer: Self-pay | Admitting: Gastroenterology

## 2023-09-30 DIAGNOSIS — K648 Other hemorrhoids: Secondary | ICD-10-CM | POA: Diagnosis not present

## 2023-09-30 DIAGNOSIS — K635 Polyp of colon: Secondary | ICD-10-CM

## 2023-09-30 DIAGNOSIS — N83202 Unspecified ovarian cyst, left side: Secondary | ICD-10-CM | POA: Diagnosis not present

## 2023-09-30 DIAGNOSIS — K2289 Other specified disease of esophagus: Secondary | ICD-10-CM | POA: Insufficient documentation

## 2023-09-30 DIAGNOSIS — I10 Essential (primary) hypertension: Secondary | ICD-10-CM | POA: Insufficient documentation

## 2023-09-30 DIAGNOSIS — R1310 Dysphagia, unspecified: Secondary | ICD-10-CM | POA: Diagnosis not present

## 2023-09-30 DIAGNOSIS — F17210 Nicotine dependence, cigarettes, uncomplicated: Secondary | ICD-10-CM | POA: Diagnosis not present

## 2023-09-30 DIAGNOSIS — K5730 Diverticulosis of large intestine without perforation or abscess without bleeding: Secondary | ICD-10-CM | POA: Diagnosis not present

## 2023-09-30 DIAGNOSIS — Z1211 Encounter for screening for malignant neoplasm of colon: Secondary | ICD-10-CM | POA: Insufficient documentation

## 2023-09-30 DIAGNOSIS — Z09 Encounter for follow-up examination after completed treatment for conditions other than malignant neoplasm: Secondary | ICD-10-CM | POA: Diagnosis not present

## 2023-09-30 DIAGNOSIS — K621 Rectal polyp: Secondary | ICD-10-CM | POA: Diagnosis not present

## 2023-09-30 DIAGNOSIS — Z79899 Other long term (current) drug therapy: Secondary | ICD-10-CM | POA: Insufficient documentation

## 2023-09-30 DIAGNOSIS — K2100 Gastro-esophageal reflux disease with esophagitis, without bleeding: Secondary | ICD-10-CM | POA: Diagnosis not present

## 2023-09-30 DIAGNOSIS — D128 Benign neoplasm of rectum: Secondary | ICD-10-CM | POA: Diagnosis not present

## 2023-09-30 DIAGNOSIS — K449 Diaphragmatic hernia without obstruction or gangrene: Secondary | ICD-10-CM | POA: Insufficient documentation

## 2023-09-30 DIAGNOSIS — N83201 Unspecified ovarian cyst, right side: Secondary | ICD-10-CM | POA: Diagnosis not present

## 2023-09-30 DIAGNOSIS — J45909 Unspecified asthma, uncomplicated: Secondary | ICD-10-CM | POA: Diagnosis not present

## 2023-09-30 DIAGNOSIS — Z860101 Personal history of adenomatous and serrated colon polyps: Secondary | ICD-10-CM | POA: Diagnosis not present

## 2023-09-30 DIAGNOSIS — Z860100 Personal history of colon polyps, unspecified: Secondary | ICD-10-CM | POA: Diagnosis not present

## 2023-09-30 DIAGNOSIS — B182 Chronic viral hepatitis C: Secondary | ICD-10-CM | POA: Diagnosis not present

## 2023-09-30 DIAGNOSIS — K219 Gastro-esophageal reflux disease without esophagitis: Secondary | ICD-10-CM | POA: Diagnosis not present

## 2023-09-30 DIAGNOSIS — D123 Benign neoplasm of transverse colon: Secondary | ICD-10-CM | POA: Insufficient documentation

## 2023-09-30 DIAGNOSIS — R933 Abnormal findings on diagnostic imaging of other parts of digestive tract: Secondary | ICD-10-CM

## 2023-09-30 HISTORY — PX: POLYPECTOMY: SHX5525

## 2023-09-30 HISTORY — PX: ESOPHAGOGASTRODUODENOSCOPY (EGD) WITH PROPOFOL: SHX5813

## 2023-09-30 HISTORY — PX: ESOPHAGEAL DILATION: SHX303

## 2023-09-30 HISTORY — PX: COLONOSCOPY WITH PROPOFOL: SHX5780

## 2023-09-30 HISTORY — PX: BIOPSY: SHX5522

## 2023-09-30 LAB — HM COLONOSCOPY

## 2023-09-30 SURGERY — COLONOSCOPY WITH PROPOFOL
Anesthesia: General

## 2023-09-30 MED ORDER — PROPOFOL 1000 MG/100ML IV EMUL
INTRAVENOUS | Status: AC
  Filled 2023-09-30: qty 100

## 2023-09-30 MED ORDER — PROPOFOL 10 MG/ML IV BOLUS
INTRAVENOUS | Status: DC | PRN
  Administered 2023-09-30: 50 mg via INTRAVENOUS
  Administered 2023-09-30: 100 mg via INTRAVENOUS

## 2023-09-30 MED ORDER — LIDOCAINE HCL (CARDIAC) PF 100 MG/5ML IV SOSY
PREFILLED_SYRINGE | INTRAVENOUS | Status: DC | PRN
  Administered 2023-09-30: 100 mg via INTRAVENOUS

## 2023-09-30 MED ORDER — LACTATED RINGERS IV SOLN: INTRAVENOUS | Status: DC

## 2023-09-30 MED ORDER — GLYCOPYRROLATE PF 0.2 MG/ML IJ SOSY
PREFILLED_SYRINGE | INTRAMUSCULAR | Status: DC | PRN
Start: 2023-09-30 — End: 2023-09-30
  Administered 2023-09-30: .1 mg via INTRAVENOUS

## 2023-09-30 MED ORDER — GLYCOPYRROLATE PF 0.2 MG/ML IJ SOSY
PREFILLED_SYRINGE | INTRAMUSCULAR | Status: AC
  Filled 2023-09-30: qty 1

## 2023-09-30 MED ORDER — LIDOCAINE HCL (PF) 2 % IJ SOLN
INTRAMUSCULAR | Status: AC
  Filled 2023-09-30: qty 5

## 2023-09-30 MED ORDER — PROPOFOL 500 MG/50ML IV EMUL
INTRAVENOUS | Status: DC | PRN
  Administered 2023-09-30: 150 ug/kg/min via INTRAVENOUS

## 2023-09-30 MED ORDER — STERILE WATER FOR IRRIGATION IR SOLN
Status: DC | PRN
  Administered 2023-09-30: 100 mL

## 2023-09-30 NOTE — Interval H&P Note (Signed)
History and Physical Interval Note:  09/30/2023 8:21 AM  Judy Leonard  has presented today for surgery, with the diagnosis of RLQ  abd pain, abnormal polypoid tissue at the appendiceal orifice, hx of colon polyps, gerd, epigastric pain, dysphagia.  The various methods of treatment have been discussed with the patient and family. After consideration of risks, benefits and other options for treatment, the patient has consented to  Procedure(s) with comments: COLONOSCOPY WITH PROPOFOL (N/A) - 930am, asa 3 ESOPHAGOGASTRODUODENOSCOPY (EGD) WITH PROPOFOL (N/A) ESOPHAGEAL DILATION (N/A) as a surgical intervention.  The patient's history has been reviewed, patient examined, no change in status, stable for surgery.  I have reviewed the patient's chart and labs.  Questions were answered to the patient's satisfaction.     [name]

## 2023-09-30 NOTE — Transfer of Care (Signed)
Immediate Anesthesia Transfer of Care Note  Patient: Judy Leonard  Procedure(s) Performed: COLONOSCOPY WITH PROPOFOL ESOPHAGOGASTRODUODENOSCOPY (EGD) WITH PROPOFOL ESOPHAGEAL DILATION BIOPSY POLYPECTOMY  Patient Location: Short Stay  Anesthesia Type:General  Level of Consciousness: awake and patient cooperative  Airway & Oxygen Therapy: Patient Spontanous Breathing  Post-op Assessment: Report given to RN and Post -op Vital signs reviewed and stable  Post vital signs: Reviewed and stable  Last Vitals:  Vitals Value Taken Time  BP 120/57 09/30/23 1037  Temp 36.7 C 09/30/23 1037  Pulse 82 09/30/23 1037  Resp 16 09/30/23 1037  SpO2 100 % 09/30/23 1037    Last Pain:  Vitals:   09/30/23 1037  TempSrc: Oral  PainSc: 0-No pain         Complications: No notable events documented.

## 2023-09-30 NOTE — Anesthesia Preprocedure Evaluation (Addendum)
Anesthesia Evaluation  Patient identified by MRN, date of birth, ID band Patient awake    Reviewed: Allergy & Precautions, NPO status , Patient's Chart, lab work & pertinent test results  Airway Mallampati: III  TM Distance: >3 FB Neck ROM: Limited    Dental no notable dental hx. (+) Poor Dentition, Dental Advisory Given, Partial Upper   Pulmonary asthma , COPD,  COPD inhaler, Current Smoker and Patient abstained from smoking. Mild COPD   Pulmonary exam normal breath sounds clear to auscultation       Cardiovascular hypertension, Pt. on medications Normal cardiovascular exam Rhythm:Regular Rate:Normal  ECG: NSR, rate 96   Neuro/Psych  negative psych ROS   GI/Hepatic Neg liver ROS,GERD  ,,  Endo/Other  negative endocrine ROS    Renal/GU negative Renal ROS     Musculoskeletal  (+) Arthritis ,    Abdominal  (+) + obese  Peds  Hematology negative hematology ROS (+)   Anesthesia Other Findings Cervical Stenosis - myelopathy  Reproductive/Obstetrics                             Anesthesia Physical Anesthesia Plan  ASA: 2  Anesthesia Plan: General   Post-op Pain Management: Minimal or no pain anticipated   Induction: Intravenous  PONV Risk Score and Plan: 2 and 1 and Propofol infusion  Airway Management Planned: Nasal Cannula and Natural Airway  Additional Equipment:   Intra-op Plan:   Post-operative Plan:   Informed Consent: I have reviewed the patients History and Physical, chart, labs and discussed the procedure including the risks, benefits and alternatives for the proposed anesthesia with the patient or authorized representative who has indicated his/her understanding and acceptance.     Dental advisory given  Plan Discussed with: CRNA  Anesthesia Plan Comments:         Anesthesia Quick Evaluation

## 2023-09-30 NOTE — Op Note (Addendum)
[facility] Patient Name: Judy Leonard Procedure Date: 09/30/2023 9:28 AM MRN: [id number] Date of Birth: [birth year] Attending MD: Katrinka Blazing , , [id number] CSN: [id number] Age: 57 Admit Type: Outpatient Procedure:                Colonoscopy Indications:              High risk colon cancer surveillance: Personal                            history of adenoma less than 10 mm in size,                            abnormal cecal findings on last colonoscopy Providers:                Katrinka Blazing, Angelica Ran, Elinor Parkinson Referring MD:             Katrinka Blazing Medicines:                Monitored Anesthesia Care Complications:            No immediate complications. Estimated Blood Loss:     Estimated blood loss: none. Procedure:                Pre-Anesthesia Assessment:                           - Prior to the procedure, a History and Physical                            was performed, and patient medications, allergies                            and sensitivities were reviewed. The patient's                            tolerance of previous anesthesia was reviewed.                           - The risks and benefits of the procedure and the                            sedation options and risks were discussed with the                            patient. All questions were answered and informed                            consent was obtained.                           - ASA Grade Assessment: II - A patient with mild                            systemic disease.                           After  obtaining informed consent, the colonoscope                            was passed under direct vision. Throughout the                            procedure, the patient's blood pressure, pulse, and                            oxygen saturations were monitored continuously. The                            [id number]-HQ190L ([id number]) scope was introduced through                            the  anus and advanced to the the cecum, identified                            by appendiceal orifice and ileocecal valve. The                            colonoscopy was performed without difficulty. The                            patient tolerated the procedure well. The quality                            of the bowel preparation was good. Scope In: 10:01:11 AM Scope Out: 10:31:12 AM Scope Withdrawal Time: 0 hours 25 minutes 17 seconds  Total Procedure Duration: 0 hours 30 minutes 1 second  Findings:      The perianal and digital rectal examinations were normal.      The appendiceal orifice appeared normal. The orifice looked slightly       thickened but no adenomatous changes were found. The thickening likely       corresponds to post-appendectomy changes. Imaging was performed using       white light and narrow band imaging to visualize the mucosa.      Two sessile polyps were found in the transverse colon. The polyps were 3       to 4 mm in size. These polyps were removed with a cold snare. Resection       and retrieval were complete.      Three sessile polyps were found in the rectum. The polyps were 2 to 4 mm       in size. These polyps were removed with a cold snare. Resection and       retrieval were complete.      A 1 mm polyp was found in the rectum. The polyp was sessile. The polyp       was removed with a cold biopsy forceps. Resection and retrieval were       complete.      Non-bleeding internal hemorrhoids were found during retroflexion. The       hemorrhoids were small. Impression:               - The appendiceal orifice is normal.                           -  Two 3 to 4 mm polyps in the transverse colon,                            removed with a cold snare. Resected and retrieved.                           - Three 2 to 4 mm polyps in the rectum, removed                            with a cold snare. Resected and retrieved.                           - One 1 mm polyp in the  rectum, removed with a cold                            biopsy forceps. Resected and retrieved.                           - Non-bleeding internal hemorrhoids. Moderate Sedation:      Per Anesthesia Care Recommendation:           - Discharge patient to home (ambulatory).                           - Resume previous diet.                           - Await pathology results.                           - Repeat colonoscopy in 3 years for surveillance. Procedure Code(s):        --- Professional ---                           [phone], Colonoscopy, flexible; with removal of                            tumor(s), polyp(s), or other lesion(s) by snare                            technique                           45380, 59, Colonoscopy, flexible; with biopsy,                            single or multiple Diagnosis Code(s):        --- Professional ---                           Z86.010, Personal history of colonic polyps                           K64.8, Other hemorrhoids  D12.3, Benign neoplasm of transverse colon (hepatic                            flexure or splenic flexure)                           D12.8, Benign neoplasm of rectum CPT copyright 2022 American Medical Association. All rights reserved. The codes documented in this report are preliminary and upon coder review may  be revised to meet current compliance requirements. Katrinka Blazing, MD Katrinka Blazing,  09/30/2023 10:42:02 AM This report has been signed electronically. Number of Addenda: 0

## 2023-09-30 NOTE — Anesthesia Postprocedure Evaluation (Signed)
Anesthesia Post Note  Patient: Nathania A. Ewan  Procedure(s) Performed: COLONOSCOPY WITH PROPOFOL ESOPHAGOGASTRODUODENOSCOPY (EGD) WITH PROPOFOL ESOPHAGEAL DILATION BIOPSY POLYPECTOMY  Patient location during evaluation: PACU Anesthesia Type: General Level of consciousness: awake and alert Pain management: pain level controlled Vital Signs Assessment: post-procedure vital signs reviewed and stable Respiratory status: spontaneous breathing, nonlabored ventilation, respiratory function stable and patient connected to nasal cannula oxygen Cardiovascular status: blood pressure returned to baseline and stable Postop Assessment: no apparent nausea or vomiting Anesthetic complications: no   There were no known notable events for this encounter.   Last Vitals:  Vitals:   09/30/23 0755 09/30/23 1037  BP: (!) 151/93 (!) 120/57  Pulse: 67 82  Resp: 18 16  Temp: 36.7 C 36.7 C  SpO2: 98% 100%    Last Pain:  Vitals:   09/30/23 1037  TempSrc: Oral  PainSc: 0-No pain                 Sabre Romberger L Destiney Sanabia

## 2023-09-30 NOTE — Op Note (Addendum)
[facility] Patient Name: Judy Leonard Procedure Date: 09/30/2023 9:30 AM MRN: [id number] Date of Birth: 01-08-1966 Attending MD: Katrinka Blazing , , [id number] CSN: [id number] Age: 57 Admit Type: Outpatient Procedure:                Upper GI endoscopy Indications:              Dysphagia, Gastro-esophageal reflux disease Providers:                Katrinka Blazing, Angelica Ran, Elinor Parkinson Referring MD:             Katrinka Blazing Medicines:                Monitored Anesthesia Care Complications:            No immediate complications. Estimated Blood Loss:     Estimated blood loss: none. Procedure:                Pre-Anesthesia Assessment:                           - Prior to the procedure, a History and Physical                            was performed, and patient medications, allergies                            and sensitivities were reviewed. The patient's                            tolerance of previous anesthesia was reviewed.                           - The risks and benefits of the procedure and the                            sedation options and risks were discussed with the                            patient. All questions were answered and informed                            consent was obtained.                           - ASA Grade Assessment: II - A patient with mild                            systemic disease.                           After obtaining informed consent, the endoscope was                            passed under direct vision. Throughout the                            procedure, the patient's  blood pressure, pulse, and                            oxygen saturations were monitored continuously. The                            [id number]-[id number] ([id number]) scope was introduced through the                            mouth, and advanced to the second part of duodenum.                            The upper GI endoscopy was accomplished without                             difficulty. The patient tolerated the procedure                            well. Scope In: 9:42:07 AM Scope Out: 9:52:57 AM Total Procedure Duration: 0 hours 10 minutes 50 seconds  Findings:      No endoscopic abnormality was evident in the esophagus to explain the       patient's complaint of dysphagia. It was decided, however, to proceed       with dilation of the entire esophagus. A guidewire was placed and the       scope was withdrawn. Dilation was performed with a Savary dilator with       mild resistance at 18 mm. The dilation site was examined following       endoscope reinsertion and showed mild mucosal disruption.      A 2 cm hiatal hernia was present.      The Z-line was irregular and was found 37 cm from the incisors. Biopsies       were taken with a cold forceps for histology.      The gastroesophageal flap valve was visualized endoscopically and       classified as Hill Grade III (minimal fold, loose to endoscope, hiatal       hernia likely).      The entire examined stomach was normal.      The examined duodenum was normal. Impression:               - No endoscopic esophageal abnormality to explain                            patient's dysphagia. Esophagus dilated.                           - 2 cm hiatal hernia.                           - Z-line irregular, 37 cm from the incisors.                            Biopsied.                           - Normal stomach.                           -  Normal examined duodenum. Moderate Sedation:      Per Anesthesia Care Recommendation:           - Discharge patient to home (ambulatory).                           - Resume previous diet.                           - Await pathology results.                           - Continue present medications.                           - If recurrent episodes of heartburn or interested                            in not taking antacids, can discuss possiblity of                             combo Transoral Incisionless Fundoplication (TIF).                            Pamphlet provided. Procedure Code(s):        --- Professional ---                           [phone], Esophagogastroduodenoscopy, flexible,                            transoral; with insertion of guide wire followed by                            passage of dilator(s) through esophagus over guide                            wire                           43239, 59, Esophagogastroduodenoscopy, flexible,                            transoral; with biopsy, single or multiple Diagnosis Code(s):        --- Professional ---                           R13.10, Dysphagia, unspecified                           K44.9, Diaphragmatic hernia without obstruction or                            gangrene                           K22.89, Other specified disease of esophagus  K21.9, Gastro-esophageal reflux disease without                            esophagitis CPT copyright 2022 American Medical Association. All rights reserved. The codes documented in this report are preliminary and upon coder review may  be revised to meet current compliance requirements. Katrinka Blazing, MD Katrinka Blazing,  09/30/2023 10:35:59 AM This report has been signed electronically. Number of Addenda: 0

## 2023-09-30 NOTE — Discharge Instructions (Addendum)
You are being discharged to home.  Resume your previous diet.  We are waiting for your pathology results.  Continue your present medications. . If recurrent episodes of heartburn or interested in not taking antacids, can discuss possiblity of combo Transoral Incisionless Fundoplication (TIF). Pamphlet provided. Your physician has recommended a repeat colonoscopy in three years for surveillance.

## 2023-10-01 ENCOUNTER — Encounter (INDEPENDENT_AMBULATORY_CARE_PROVIDER_SITE_OTHER): Payer: Self-pay | Admitting: *Deleted

## 2023-10-01 LAB — SURGICAL PATHOLOGY

## 2023-10-02 ENCOUNTER — Encounter (INDEPENDENT_AMBULATORY_CARE_PROVIDER_SITE_OTHER): Payer: Self-pay | Admitting: *Deleted

## 2023-10-08 ENCOUNTER — Encounter (HOSPITAL_COMMUNITY): Payer: Self-pay | Admitting: Gastroenterology

## 2023-11-16 NOTE — Progress Notes (Unsigned)
Judy Leonard, female    DOB: Jan 31, 1966    MRN: [id number]  Brief patient profile:  58  yobf  active smoker referred to pulmonary clinic in [place]  11/14/2022 by [name]  NP for copd eval with onset of symptoms 2019 with abn pfts c/w GOLD 2 copd   History of Present Illness  11/14/2022  Pulmonary/ 1st office eval/ [facility] / [name] advair 100 one daily  / dulera 100 each pm with poor hfa technique Chief Complaint  Patient presents with   Consult    Consult for SOB with exertion   Dyspnea:  pushing basket basket at [employer]  Cough: smoker's rattle, worse  in am clears with 1-2 coughs /mucoid Sleep: once a week, waking up choking x sev years  SABA use: avg 3 x daily / never prechallenges  02: none  Lung cancer screen: rec per PCP  Rec Plan A = Automatic = Always=    Dulera 100 Take 2 puffs first thing in am and then another 2 puffs about 12 hours later.  Plan B = Backup (to supplement plan A, not to replace it) Only use your albuterol inhaler as a rescue medication Also  Ok to try albuterol 15 min before an activity (on alternating days)  that you know would usually make you short of breath Stop fluticasone inhaler    05/19/2023  f/u ov/[place] office/[name] re: GOLD 2/ still smoking maint on breztri  with about 50% effective techniq  Chief Complaint  Patient presents with   Follow-up    Breathing has improved since the last visit. She has only had to use neb x 2 since the last visit.   Dyspnea:  walks 5 min loop plus back all day on feet/ limited by back pain  Cough: none  Sleeping: flat bed lots of pillows  SABA use: rarely hfa and once neb since last  02: none  Bad noct HB/ admit not compliant with diet  Rec Plan A = Automatic = Always=    Breztri Take 2 puffs (or Symbicort 160) first thing in am and then another 2 puffs about 12 hours later.  Work on inhaler technique:   Plan B = Backup (to supplement plan A, not to replace it) Only use your albuterol  inhaler as a rescue medication Plan C = Crisis (instead of Plan B but only if Plan B stops working) - only use your albuterol nebulizer if you first try Plan B GERD diet reviewed, bed blocks rec  The key is to stop smoking completely before smoking completely stops you! Work on inhaler technique:       11/17/2023  6 m  f/u ov/[place] office/[name] re: GOLD 2 copd/ AB  maint on Breztri   Chief Complaint  Patient presents with   Cough   Dyspnea:  back stops her before breathing  Cough: none  Sleeping: flat bed s  resp cc  SABA use: occ uses dulera  02: none   Lung cancer screening: referred today    No obvious day to day or daytime variability or assoc excess/ purulent sputum or mucus plugs or hemoptysis or cp or chest tightness, subjective wheeze or overt sinus or hb symptoms.    Also denies any obvious fluctuation of symptoms with weather or environmental changes or other aggravating or alleviating factors except as outlined above   No unusual exposure hx or h/o childhood pna/ asthma or knowledge of premature birth.  Current Allergies, Complete Past Medical  History, Past Surgical History, Family History, and Social History were reviewed in [employer] record.  ROS  The following are not active complaints unless bolded Hoarseness, sore throat, dysphagia, dental problems, itching, sneezing,  nasal congestion or discharge of excess mucus or purulent secretions, ear ache,   fever, chills, sweats, unintended wt loss or wt gain, classically pleuritic or exertional cp,  orthopnea pnd or arm/hand swelling  or leg swelling, presyncope, palpitations, abdominal pain, anorexia, nausea, vomiting, diarrhea  or change in bowel habits or change in bladder habits, change in stools or change in urine, dysuria, hematuria,  rash, arthralgias, visual complaints, headache, numbness, weakness or ataxia or problems with walking or coordination,  change in mood or  memory.         Current Meds  Medication Sig   ALPRAZolam (XANAX) 0.5 MG tablet Take 0.5 mg by mouth at bedtime as needed for anxiety.   benzonatate (TESSALON) 100 MG capsule Take by mouth 3 (three) times daily as needed for cough.   Budeson-Glycopyrrol-Formoterol (BREZTRI AEROSPHERE) 160-9-4.8 MCG/ACT AERO Take 2 puffs first thing in am and then another 2 puffs about 12 hours later.   celecoxib (CELEBREX) 200 MG capsule 1 capsule with food   ergocalciferol (VITAMIN D2) 1.25 MG (50000 UT) capsule 1 capsule Orally ONCE WEEKLY for 90 days   famotidine (PEPCID) 20 MG tablet One hour before bed   ipratropium-albuterol (DUONEB) 0.5-2.5 (3) MG/3ML SOLN Inhale into the lungs.   losartan (COZAAR) 50 MG tablet 1 tablet Orally Once a day for 30 days   pantoprazole (PROTONIX) 40 MG tablet Take 1 tablet (40 mg total) by mouth daily. Take 30-60 min before first meal of the day (Patient taking differently: Take 40 mg by mouth 2 (two) times daily before a meal. Take 30-60 min before first meal of the day)   sertraline (ZOLOFT) 25 MG tablet 1 tablet Orally Once a day   tiZANidine (ZANAFLEX) 4 MG tablet Take 1 tablet (4 mg total) by mouth at bedtime as needed for muscle spasms.             Past Medical History:  Diagnosis Date   Arthritis    Asthma    Hypertension        Objective:    wts  11/17/2023     206   05/19/2023       205  04/07/2023         208  02/18/2023       206  12/27/2022     202   11/14/22 207 lb 6.4 oz (94.1 kg)  03/18/22 193 lb (87.5 kg)  09/13/21 199 lb 9.6 oz (90.5 kg)    Vital signs reviewed  11/17/2023  - Note at rest 02 sats  93% on RA   General appearance:    amb bf nad     HEENT : Oropharynx  clear  Nasal turbinates nl    NECK :  without  apparent JVD/ palpable Nodes/TM    LUNGS: no acc muscle use,  Min barrel  contour chest wall with bilateral  slightly decreased bs s audible wheeze and  without cough on insp or exp maneuvers and min  Hyperresonant  to  percussion  bilaterally    CV:  RRR  no s3 or murmur or increase in P2, and no edema   ABD:  soft and nontender   MS:  Nl gait/ ext warm without deformities Or obvious joint restrictions  calf tenderness, cyanosis or  clubbing     SKIN: warm and dry without lesions    NEURO:  alert, approp, nl sensorium with  no motor or cerebellar deficits apparent.                 I personally reviewed images and agree with radiology impression as follows:   Chest CTa   03/01/23   Moderate centrilobular emphysema changes prominent in the upper lobes. No evidence of pneumonia or pulmonary edema.       Assessment

## 2023-11-17 ENCOUNTER — Encounter: Payer: Self-pay | Admitting: Internal Medicine

## 2023-11-17 ENCOUNTER — Ambulatory Visit (INDEPENDENT_AMBULATORY_CARE_PROVIDER_SITE_OTHER): Payer: 59 | Admitting: Internal Medicine

## 2023-11-17 VITALS — BP 146/85 | HR 87 | Ht 66.5 in | Wt 206.0 lb

## 2023-11-17 DIAGNOSIS — F17210 Nicotine dependence, cigarettes, uncomplicated: Secondary | ICD-10-CM | POA: Diagnosis not present

## 2023-11-17 DIAGNOSIS — J449 Chronic obstructive pulmonary disease, unspecified: Secondary | ICD-10-CM

## 2023-11-17 MED ORDER — ALBUTEROL SULFATE HFA 108 (90 BASE) MCG/ACT IN AERS
INHALATION_SPRAY | RESPIRATORY_TRACT | 11 refills | Status: AC
Start: 2023-11-17 — End: ?

## 2023-11-17 MED ORDER — ALBUTEROL SULFATE (2.5 MG/3ML) 0.083% IN NEBU
2.5000 mg | INHALATION_SOLUTION | RESPIRATORY_TRACT | 12 refills | Status: AC | PRN
Start: 2023-11-17 — End: ?

## 2023-11-17 NOTE — Patient Instructions (Addendum)
Plan A = Automatic = Always=    Breztri Take 2 puffs first thing in am and then another 2 puffs about 12 hours later.   Work on inhaler technique:  relax and gently blow all the way out then take a nice smooth full deep breath back in, triggering the inhaler at same time you start breathing in.  Hold breath in for at least  5 seconds if you can. Blow out thru nose. Rinse and gargle with water when done.  If mouth or throat bother you at all,  try brushing teeth/gums/tongue with arm and hammer toothpaste/ make a slurry and gargle and spit out.   >>>  Remember how golfers warm up by taking practice swings - do this with an empty inhaler   Plan B = Backup (to supplement plan A, not to replace it) Only use your albuterol inhaler as a rescue medication to be used if you can't catch your breath by resting or doing a relaxed purse lip breathing pattern.  - The less you use it, the better it will work when you need it. - Ok to use the inhaler up to 2 puffs  every 4 hours if you must but call for appointment if use goes up over your usual need - Don't leave home without it !!  (think of it like the spare tire for your car)   Plan C = Crisis (instead of Plan B but only if Plan B stops working) - only use your albuterol nebulizer if you first try Plan B and it fails to help > ok to use the nebulizer up to every 4 hours but if start needing it regularly call for immediate appointment  My office will be contacting you by phone for referral to lung cancer screening  [phone]  - if you don't hear back from my office within one week please call us back or notify us thru MyChart and we'll address it right away.   Please schedule a follow up visit in 6 months but call sooner if needed -bring inhlaers

## 2023-11-18 ENCOUNTER — Encounter: Payer: Self-pay | Admitting: Internal Medicine

## 2023-11-18 NOTE — Assessment & Plan Note (Signed)
Active smoker  -  Spirometry 10/17/22 digital readout blurred,  f/v curve not concave  FEV1 1.39 (58%)  ratio 0.55 - 11/14/2022   Walked on RA   x  3  lap(s) =  approx 450  ft  @ mod pace, stopped due to end of study  with lowest 02 sats 94% with sob p one lap but never stopped - 11/14/2022  After extensive coaching inhaler device,  effectiveness =    75% short Ti late trigger > dulera 100 Take 2 puffs first thing in am and then another 2 puffs about 12 hours later.  - 12/27/2022   Walked on RA  x  3  lap(s) =  approx 450  ft  @ mod pace, stopped due to end of study with lowest 02 sats 95% and only mild sob p lap 1  - 02/18/2023   Walked on RA  x  3  lap(s) =  approx 750  ft  @ mod pace, stopped due to end of study with lowest 02 sats 95% mild sob p lap 1   Chest CTa   03/01/23   Moderate centrilobular emphysema changes prominent in the upper lobes. - 05/19/2023  After extensive coaching inhaler device,  effectiveness =    75% from baseline of 50% (short ti) > start breztri  - 11/17/2023  After extensive coaching inhaler device,  effectiveness =    60 % (short ti) > continue breztri  and approp saba  Re SABA :  I spent extra time with pt today reviewing appropriate use of albuterol for prn use on exertion with the following points: 1) saba is for relief of sob that does not improve by walking a slower pace or resting but rather if the pt does not improve after trying this first. 2) If the pt is convinced, as many are, that saba helps recover from activity faster then it's easy to tell if this is the case by re-challenging : ie stop, take the inhaler, then p 5 minutes try the exact same activity (intensity of workload) that just caused the symptoms and see if they are substantially diminished or not after saba 3) if there is an activity that reproducibly causes the symptoms, try the saba 15 min before the activity on alternate days   If in fact the saba really does help, then fine to continue to use it  prn but advised may need to look closer at the maintenance regimen(breztri in this case)  being used to achieve better control of airways disease with exertion.

## 2023-11-18 NOTE — Assessment & Plan Note (Signed)
4-5 min discussion re active cigarette smoking in addition to office E&M  Ask about tobacco use:   ongoing Advise quitting   I took an extended  opportunity with this patient to outline the consequences of continued cigarette use  in airway disorders based on all the data we have from the multiple national lung health studies (perfomed over decades at millions of dollars in cost)  indicating that smoking cessation, not choice of inhalers or pulmonary physicians, is the most important aspect of her  care.   Assess willingness:  Not committed at this point Assist in quit attempt:  Per PCP when ready Arrange follow up:   Follow up per Primary Care planned     Low-dose CT lung cancer screening is recommended for patients who are 83-10 years of age with a 20+ pack-year history of smoking and who are currently smoking or quit <=15 years ago. No coughing up blood  No unintentional weight loss of > 15 pounds in the last 6 months - pt is eligible for scanning yearly until 15 y p quits > referred         Each maintenance medication was reviewed in detail including emphasizing most importantly the difference between maintenance and prns and under what circumstances the prns are to be triggered using an action plan format where appropriate.  Total time for H and P, chart review, counseling, reviewing hfa/neb device(s) and generating customized AVS unique to this office visit / same day charting = 

## 2023-12-21 ENCOUNTER — Other Ambulatory Visit: Payer: Self-pay | Admitting: Internal Medicine

## 2023-12-21 DIAGNOSIS — J449 Chronic obstructive pulmonary disease, unspecified: Secondary | ICD-10-CM

## 2024-03-18 ENCOUNTER — Other Ambulatory Visit: Payer: Self-pay | Admitting: *Deleted

## 2024-03-18 ENCOUNTER — Telehealth: Payer: Self-pay | Admitting: *Deleted

## 2024-03-18 DIAGNOSIS — Z122 Encounter for screening for malignant neoplasm of respiratory organs: Secondary | ICD-10-CM

## 2024-03-18 DIAGNOSIS — F17210 Nicotine dependence, cigarettes, uncomplicated: Secondary | ICD-10-CM

## 2024-03-18 DIAGNOSIS — Z87891 Personal history of nicotine dependence: Secondary | ICD-10-CM

## 2024-03-18 NOTE — Telephone Encounter (Signed)
 Lung Cancer Screening Narrative/Criteria Questionnaire (Cigarette Smokers Only- No Cigars/Pipes/vapes)   Judy Leonard   SDMV:04/08/24 10:45- Orpha Bur                                           [birth year]              LDCT: 04/15/24 9:30- AP    57 y.o.   Phone: [phone]  Lung Screening Narrative (confirm age 50-77 yrs Medicare / 50-80 yrs Private pay insurance)   Insurance information:[employer]   Referring Provider:Wert   This screening involves an initial phone call with a team member from our program. It is called a shared decision making visit. The initial meeting is required by insurance and Medicare to make sure you understand the program. This appointment takes about 15-20 minutes to complete. The CT scan will completed at a separate date/time. This scan takes about 5-10 minutes to complete and you may eat and drink before and after the scan.  Criteria questions for Lung Cancer Screening:   Are you a current or former smoker? Current Age began smoking: 21   If you are a former smoker, what year did you quit smoking? (within 15 yrs)   To calculate your smoking history, I need an accurate estimate of how many packs of cigarettes you smoked per day and for how many years. (Not just the number of PPD you are now smoking)   Years smoking 36 x Packs per day 1/2+ = Pack years 20   (at least 20 pack yrs)   (Make sure they understand that we need to know how much they have smoked in the past, not just the number of PPD they are smoking now)  Do you have a personal history of cancer?  No    Do you have a family history of cancer? Yes  (cancer type and and relative) Uncle (throat)  Are you coughing up blood?  No  Have you had unexplained weight loss of 15 lbs or more in the last 6 months? No  It looks like you meet all criteria.     Additional information: N/A

## 2024-04-08 ENCOUNTER — Ambulatory Visit: Admitting: Adult Health

## 2024-04-08 ENCOUNTER — Encounter: Payer: Self-pay | Admitting: Adult Health

## 2024-04-08 DIAGNOSIS — F17210 Nicotine dependence, cigarettes, uncomplicated: Secondary | ICD-10-CM

## 2024-04-08 NOTE — Patient Instructions (Signed)

## 2024-04-08 NOTE — Progress Notes (Signed)
  Virtual Visit via Telephone Note  I connected with Judy Leonard , 04/08/24 10:50 AM by a telemedicine application and verified that I am speaking with the correct person using two identifiers.  Location: Patient: home Provider: home   I discussed the limitations of evaluation and management by telemedicine and the availability of in person appointments. The patient expressed understanding and agreed to proceed.   Shared Decision Making Visit Lung Cancer Screening Program [phone])   Eligibility: 58 y.o. Pack Years Smoking History Calculation = 20 pack years  (# packs/per year x # years smoked) Recent History of coughing up blood  no Unexplained weight loss? no ( >Than 15 pounds within the last 6 months ) Prior History Lung / other cancer no (Diagnosis within the last 5 years already requiring surveillance chest CT Scans). Smoking Status Current Smoker  Visit Components: Discussion included one or more decision making aids. YES Discussion included risk/benefits of screening. YES Discussion included potential follow up diagnostic testing for abnormal scans. YES Discussion included meaning and risk of over diagnosis. YES Discussion included meaning and risk of False Positives. YES Discussion included meaning of total radiation exposure. YES  Counseling Included: Importance of adherence to annual lung cancer LDCT screening. YES Impact of comorbidities on ability to participate in the program. YES Ability and willingness to under diagnostic treatment. YES  Smoking Cessation Counseling: Current Smokers:  Discussed importance of smoking cessation. yes Information about tobacco cessation classes and interventions provided to patient. yes Patient provided with "ticket" for LDCT Scan. yes Symptomatic Patient. NO Diagnosis Code: Tobacco Use Z72.0 Asymptomatic Patient yes  Counseling (Intermediate counseling: > three minutes counseling) H0865  Z12.2-Screening of respiratory  organs Z87.891-Personal history of nicotine dependence   [name] 04/08/24

## 2024-04-15 ENCOUNTER — Ambulatory Visit (HOSPITAL_COMMUNITY)
Admission: RE | Admit: 2024-04-15 | Discharge: 2024-04-15 | Disposition: A | Discharge status: Home / Self Care | Source: Ambulatory Visit | Attending: Family Medicine | Admitting: Family Medicine

## 2024-04-15 DIAGNOSIS — Z87891 Personal history of nicotine dependence: Secondary | ICD-10-CM | POA: Diagnosis present

## 2024-04-15 DIAGNOSIS — Z122 Encounter for screening for malignant neoplasm of respiratory organs: Secondary | ICD-10-CM | POA: Insufficient documentation

## 2024-04-15 DIAGNOSIS — F17210 Nicotine dependence, cigarettes, uncomplicated: Secondary | ICD-10-CM | POA: Insufficient documentation

## 2024-05-15 NOTE — Progress Notes (Addendum)
 Judy Leonard, female    DOB: 08-31-1966    MRN: [id number]  Brief patient profile:  69  yobf  active smoker referred to pulmonary clinic in [place]  11/14/2022 by Deno Flair  NP for copd eval with onset of symptoms 2019 with abn pfts c/w GOLD 2 copd   History of Present Illness  11/14/2022  Pulmonary/ 1st office eval/ Garold Sheeler / [place] [facility] advair 100 one daily  / dulera  100 each pm with poor hfa technique Chief Complaint  Patient presents with   Consult    Consult for SOB with exertion   Dyspnea:  pushing basket basket at [employer]  Cough: smoker's rattle, worse  in am clears with 1-2 coughs /mucoid Sleep: once a week, waking up choking x sev years  SABA use: avg 3 x daily / never prechallenges  02: none  Lung cancer screen: rec per PCP  Rec Plan A = Automatic = Always=    Dulera  100 Take 2 puffs first thing in am and then another 2 puffs about 12 hours later.  Plan B = Backup (to supplement plan A, not to replace it) Only use your albuterol  inhaler as a rescue medication Also  Ok to try albuterol  15 min before an activity (on alternating days)  that you know would usually make you short of breath Stop fluticasone  inhaler    05/19/2023  f/u ov/[place] office/Lenoria Narine re: GOLD 2/ still smoking maint on breztri   with about 50% effective techniq  Chief Complaint  Patient presents with   Follow-up    Breathing has improved since the last visit. She has only had to use neb x 2 since the last visit.   Dyspnea:  walks 5 min loop plus back all day on feet/ limited by back pain  Cough: none  Sleeping: flat bed lots of pillows  SABA use: rarely hfa and once neb since last  02: none  Bad noct HB/ admit not compliant with diet  Rec Plan A = Automatic = Always=    Breztri  Take 2 puffs (or Symbicort 160) first thing in am and then another 2 puffs about 12 hours later.  Work on inhaler technique:   Plan B = Backup (to supplement plan A, not to replace it) Only use your albuterol   inhaler as a rescue medication Plan C = Crisis (instead of Plan B but only if Plan B stops working) - only use your albuterol  nebulizer if you first try Plan B GERD diet reviewed, bed blocks rec  The key is to stop smoking completely before smoking completely stops you! Work on inhaler technique:       11/17/2023  6 m  f/u ov/[place] office/Lalo Tromp re: GOLD 2 copd/ AB  maint on Breztri    Chief Complaint  Patient presents with   Cough   Dyspnea:  back stops her before breathing  Cough: none  Sleeping: flat bed s  resp cc  SABA use: occ uses dulera   02: none  Lung cancer screening: referred today  Rec Plan A = Automatic = Always=    Breztri  Take 2 puffs first thing in am and then another 2 puffs about 12 hours later.  Work on inhaler technique:   >>>  Remember how golfers warm up by taking practice swings - do this with an empty inhaler  Plan B = Backup (to supplement plan A, not to replace it) Only use your albuterol  inhaler as a rescue medication  Plan C = Crisis (instead of  Plan B but only if Plan B stops working) - only use your albuterol  nebulizer if you first try Plan B Please schedule a follow up visit in 6 months but call sooner if needed -bring inhlaers  04/15/24  LDSCT RAD2 mod emphysema   05/17/2024  f/u ov/[place] office/Brodan Grewell re: GOLD 2 copd/AB maint on saba   Chief Complaint  Patient presents with   Shortness of Breath  Dyspnea:  neck and back limiting / [employer] walk across parking lot walks slow whole store / Cough: better with tessalon  / had flares with "sinus infection"  zak/pred 10 m x 48 /since May 8th 2025 > no purulent secretions now Sleeping: flat bed chair position due to neck  s   resp cc  SABA use: increased since off breztri  due to throat irritation 02: none  Lung cancer screening:  q April  Overt hb on ppi bid and h2hs   No obvious day to day or daytime variability or assoc   mucus plugs or hemoptysis or cp or chest tightness,  subjective wheeze or overt sinus  symptoms.    Also denies any obvious fluctuation of symptoms with weather or environmental changes or other aggravating or alleviating factors except as outlined above   No unusual exposure hx or h/o childhood pna/ asthma or knowledge of premature birth.  Current Allergies, Complete Past Medical History, Past Surgical History, Family History, and Social History were reviewed in [employer] record.  ROS  The following are not active complaints unless bolded Hoarseness, sore throat, dysphagia, dental problems, itching, sneezing,  nasal congestion or discharge of excess mucus or purulent secretions, ear ache,   fever, chills, sweats, unintended wt loss or wt gain, classically pleuritic or exertional cp,  orthopnea pnd or arm/hand swelling  or leg swelling, presyncope, palpitations, abdominal pain, anorexia, nausea, vomiting, diarrhea  or change in bowel habits or change in bladder habits, change in stools or change in urine, dysuria, hematuria,  rash, arthralgias, visual complaints, headache, numbness, weakness or ataxia or problems with walking or coordination,  change in mood or  memory.        Current Meds  Medication Sig   albuterol  (PROAIR  HFA) 108 (90 Base) MCG/ACT inhaler 2 puffs every 4 hours as needed only  if your can't catch your breath   albuterol  (PROVENTIL ) (2.5 MG/3ML) 0.083% nebulizer solution Take 3 mLs (2.5 mg total) by nebulization every 4 (four) hours as needed for wheezing or shortness of breath.   ALLERGY RELIEF 180 MG tablet Take 180 mg by mouth daily.   benzonatate  (TESSALON ) 100 MG capsule Take by mouth 3 (three) times daily as needed for cough.   Budeson-Glycopyrrol-Formoterol  (BREZTRI  AEROSPHERE) 160-9-4.8 MCG/ACT AERO Take 2 puffs first thing in am and then another 2 puffs about 12 hours later.   celecoxib (CELEBREX) 200 MG capsule 1 capsule with food   ergocalciferol (VITAMIN D2) 1.25 MG (50000 UT) capsule 1  capsule Orally ONCE WEEKLY for 90 days   famotidine  (PEPCID ) 20 MG tablet TAKE 1 TABLET BY MOUTH 1 HOUR BEFORE BEDTIME   losartan  (COZAAR ) 50 MG tablet 1 tablet Orally Once a day for 30 days   pantoprazole  (PROTONIX ) 40 MG tablet Take 1 tablet (40 mg total) by mouth daily. Take 30-60 min before first meal of the day (Patient taking differently: Take 40 mg by mouth 2 (two) times daily before a meal. Take 30-60 min before first meal of the day)   predniSONE  (STERAPRED UNI-PAK  48 TAB) 10 MG (48) TBPK tablet Take by mouth as directed.   sertraline (ZOLOFT) 25 MG tablet 1 tablet Orally Once a day   tiZANidine  (ZANAFLEX ) 4 MG tablet Take 1 tablet (4 mg total) by mouth at bedtime as needed for muscle spasms.                Past Medical History:  Diagnosis Date   Arthritis    Asthma    Hypertension        Objective:    wts  05/17/2024       203  11/17/2023     206   05/19/2023       205  04/07/2023         208  02/18/2023       206  12/27/2022     202   11/14/22 207 lb 6.4 oz (94.1 kg)  03/18/22 193 lb (87.5 kg)  09/13/21 199 lb 9.6 oz (90.5 kg)    Vital signs reviewed  05/17/2024  - Note at rest 02 sats  93% on RA   General appearance:    amb bf nad   HEENT : Oropharynx  clear  Nasal turbinates nl    NECK :  without  apparent JVD/ palpable Nodes/TM    LUNGS: no acc muscle use,  Min barrel  contour chest wall with bilateral  slightly decreased bs s audible wheeze and  without cough on insp or exp maneuvers and min  Hyperresonant  to  percussion bilaterally    CV:  RRR  no s3 or murmur or increase in P2, and no edema   ABD:  soft and nontender with pos end  insp Hoover's  in the supine position.  No bruits or organomegaly appreciated   MS:  Nl gait/ ext warm without deformities Or obvious joint restrictions  calf tenderness, cyanosis or clubbing     SKIN: warm and dry without lesions    NEURO:  alert, approp, nl sensorium with  no motor or cerebellar deficits apparent.                Assessment

## 2024-05-17 ENCOUNTER — Other Ambulatory Visit: Payer: Self-pay | Admitting: Acute Care

## 2024-05-17 ENCOUNTER — Ambulatory Visit: Admitting: Internal Medicine

## 2024-05-17 ENCOUNTER — Encounter: Payer: Self-pay | Admitting: Internal Medicine

## 2024-05-17 VITALS — BP 134/87 | HR 94 | Ht 66.5 in | Wt 203.0 lb

## 2024-05-17 DIAGNOSIS — J449 Chronic obstructive pulmonary disease, unspecified: Secondary | ICD-10-CM | POA: Diagnosis not present

## 2024-05-17 DIAGNOSIS — Z87891 Personal history of nicotine dependence: Secondary | ICD-10-CM

## 2024-05-17 DIAGNOSIS — F17210 Nicotine dependence, cigarettes, uncomplicated: Secondary | ICD-10-CM

## 2024-05-17 DIAGNOSIS — Z122 Encounter for screening for malignant neoplasm of respiratory organs: Secondary | ICD-10-CM

## 2024-05-17 MED ORDER — MOMETASONE FURO-FORMOTEROL FUM 100-5 MCG/ACT IN AERO: INHALATION_SPRAY | RESPIRATORY_TRACT | 11 refills | Status: AC

## 2024-05-17 NOTE — Assessment & Plan Note (Signed)
 Active smoker  -  Spirometry 10/17/22 digital readout blurred,  f/v curve not concave  FEV1 1.39 (58%)  ratio 0.55 - 11/14/2022   Walked on RA   x  3  lap(s) =  approx 450  ft  @ mod pace, stopped due to end of study  with lowest 02 sats 94% with sob p one lap but never stopped - 11/14/2022  After extensive coaching inhaler device,  effectiveness =    75% short Ti late trigger > dulera  100 Take 2 puffs first thing in am and then another 2 puffs about 12 hours later.  - 12/27/2022   Walked on RA  x  3  lap(s) =  approx 450  ft  @ mod pace, stopped due to end of study with lowest 02 sats 95% and only mild sob p lap 1  - 02/18/2023   Walked on RA  x  3  lap(s) =  approx 750  ft  @ mod pace, stopped due to end of study with lowest 02 sats 95% mild sob p lap 1   Chest CTa   03/01/23  Moderate centrilobular emphysema changes prominent in the upper lobes. - 05/19/2023  After extensive coaching inhaler device,  effectiveness =    75% from baseline of 50% (short ti) > start breztri  > d/c''d  may 2025 "burning throat p use" was not true on duler 100  >>> 05/17/2024  After extensive coaching inhaler device,  effectiveness =    60% (short ti) > rechallenge with dulera  100, add spacer if needed and if not tolerating that may need trial of performist/bud 0.25 mg bid if insurance will cover   Comment: DDX of  difficult airways management almost all start with A and  include Adherence, Ace Inhibitors, Acid Reflux, Active Sinus Disease, Alpha 1 Antitripsin deficiency, Anxiety masquerading as Airways dz,  ABPA,  Allergy(esp in young), Aspiration (esp in elderly), Adverse effects of meds,  Active smoking or vaping, A bunch of PE's (a small clot burden can't cause this syndrome unless there is already severe underlying pulm or vascular dz with poor reserve) plus two Bs  = Bronchiectasis and Beta blocker use..and one C= CHF    Adherence is always the initial "prime suspect" and is a multilayered concern that requires a "trust  but verify" approach in every patient - starting with knowing how to use medications, especially inhalers, correctly, keeping up with refills and understanding the fundamental difference between maintenance and prns vs those medications only taken for a very short course and then stopped and not refilled.  See hfa teaching/ return in 6 weeks to regroup with all meds in hand using a trust but verify approach to confirm accurate Medication  Reconciliation The principal here is that until we are certain that the  patients are doing what we've asked, it makes no sense to ask them to do more.   ? Acid (or non-acid) GERD > always difficult to exclude as up to 75% of pts in some series report no assoc GI/ Heartburn symptoms> rec continue max (24h)  acid suppression and diet restrictions/ reviewed     Active smoking > see sep a/p  ? Active sinus dz > consider sinus CT rather than continuing empirical long term abx           Each maintenance medication was reviewed in detail including emphasizing most importantly the difference between maintenance and prns and under what circumstances the prns are to be triggered using  an action plan format where appropriate.  Total time for H and P, chart review, counseling, reviewing hfa/neb  device(s) and generating customized AVS unique to this office visit / same day charting = 30 min

## 2024-05-17 NOTE — Patient Instructions (Addendum)
 GERD (REFLUX)  is an extremely common cause of respiratory symptoms just like yours , many times with no obvious heartburn at all.    It can be treated with medication, but also with lifestyle changes including elevation of the head of your bed (ideally with 6 -8inch blocks under the headboard of your bed),  Smoking cessation, avoidance of late meals, excessive alcohol, and avoid fatty foods, chocolate, peppermint, colas, red wine, and acidic juices such as orange juice.  NO MINT OR MENTHOL  PRODUCTS SO NO COUGH DROPS  USE SUGARLESS CANDY INSTEAD (Jolley ranchers or Stover's or Life Savers) or even ice chips will also do - the key is to swallow to prevent all throat clearing. NO OIL BASED VITAMINS - use powdered substitutes.  Avoid fish oil when coughing.    Work on inhaler technique:  relax and gently blow all the way out then take a nice smooth full deep breath back in, triggering the inhaler at same time you start breathing in.  Hold breath in for at least  5 seconds if you can. Blow out dulera  thru nose. Rinse and gargle with water  when done.  If mouth or throat bother you at all,  try brushing teeth/gums/tongue with arm and hammer toothpaste/ make a slurry and gargle and spit out.      Plan A = Automatic = Always=    Dulera  100 Take 2 puffs first thing in am and then another 2 puffs about 12 hours later.     Plan B = Backup (to supplement plan A, not to replace it) Only use your albuterol  inhaler as a rescue medication to be used if you can't catch your breath by resting or doing a relaxed purse lip breathing pattern.  - The less you use it, the better it will work when you need it. - Ok to use the inhaler up to 2 puffs  every 4 hours if you must but call for appointment if use goes up over your usual need - Don't leave home without it !!  (think of it like the spare tire for your car)   Plan C = Crisis (instead of Plan B but only if Plan B stops working) - only use your albuterol  nebulizer  if you first try Plan B and it fails to help > ok to use the nebulizer up to every 4 hours but if start needing it regularly call for immediate appointment    Please schedule a follow up office visit in 6 weeks, call sooner if needed

## 2024-05-17 NOTE — Assessment & Plan Note (Signed)

## 2024-06-10 ENCOUNTER — Ambulatory Visit: Admitting: Nurse Practitioner

## 2024-06-10 ENCOUNTER — Encounter: Payer: Self-pay | Admitting: Nurse Practitioner

## 2024-06-10 VITALS — BP 144/88 | HR 107 | Wt 202.8 lb

## 2024-06-10 DIAGNOSIS — J449 Chronic obstructive pulmonary disease, unspecified: Secondary | ICD-10-CM

## 2024-06-10 DIAGNOSIS — D751 Secondary polycythemia: Secondary | ICD-10-CM

## 2024-06-10 DIAGNOSIS — G4719 Other hypersomnia: Secondary | ICD-10-CM | POA: Diagnosis not present

## 2024-06-10 DIAGNOSIS — R0683 Snoring: Secondary | ICD-10-CM

## 2024-06-10 DIAGNOSIS — E66811 Obesity, class 1: Secondary | ICD-10-CM

## 2024-06-10 NOTE — Progress Notes (Signed)
 @Patient  ID: Judy Leonard, female    DOB: 01-03-1966, 58 y.o.   MRN: [id number]  Chief Complaint  Patient presents with   New consult    Referring provider: France Ina, MD  HPI: 58 year old female, active smoker referred for sleep consult. She is followed by Dr. Waymond Hailey for COPD. Past medical history significant for HTN, GERD, chronic hepatitis C, polycythemia, cervical spondylosis, anxiety and depression.  TEST/EVENTS:   06/10/2024: Today - sleep consult Discussed the use of AI scribe software for clinical note transcription with the patient, who gave verbal consent to proceed.  History of Present Illness   Judy Leonard is a 58 year old female who presents for evaluation of possible sleep apnea. She was referred by her primary care doctor for evaluation of possible sleep apnea due to elevated hemoglobin levels.  She has a history of snoring and sometimes wakes up choking or gasping for air. She experiences daytime tiredness and sometimes wakes up throughout the night. No use of sleep medications and no history of drowsy driving or falling asleep while driving.  She has experienced sleep paralysis, describing it as 'freaky', with the last episode occurring approximately two months ago. These episodes happen randomly.  No symptoms of restless leg syndrome but notes occasional numbness in her legs, which sometimes prompts her to get up.   She has a history of COPD, on Dulera , and recently recovered from sinusitis. No history of sleepwalking. No morning headaches. Breathing stable.  Goes to bed around 1030-11 pm. Takes a couple hours to fall asleep. Some nights wakes up often and others no issues. Gets up around 6 am usually. No sleep aids. On disability. Never had a sleep study. No oxygen use.  Smokes 6-7 cigarettes a week. Working on quitting with nicotine patches. No excessive alcohol or caffeine intake. Lives with her son. Family history of heart disease.  Epworth 8       Allergies  Allergen Reactions   Hydroxyzine  Hcl Rash    Immunization History  Administered Date(s) Administered   Influenza, Seasonal, Injecte, Preservative Fre 10/08/2021   Influenza-Unspecified 11/18/2022   Moderna Covid-19 Vaccine Bivalent Booster 52yrs & up 01/15/2022   Moderna Sars-Covid-2 Vaccination 04/13/2020, 05/11/2020    Past Medical History:  Diagnosis Date   Arthritis    Asthma    COPD (chronic obstructive pulmonary disease) (HCC)    Depression    GERD (gastroesophageal reflux disease)    Hypertension     Tobacco History: Social History   Tobacco Use  Smoking Status Some Days   Current packs/day: 0.25   Average packs/day: 0.3 packs/day for 31.0 years (7.8 ttl pk-yrs)   Types: Cigarettes  Smokeless Tobacco Never  Tobacco Comments   1-2 cig days; working on quitting (taking medication prescribed by primary)   11/16- per patient smoking about 3 cig per day    02/18/23 3 cigarettes a day.   Ready to quit: Not Answered Counseling given: Not Answered Tobacco comments: 1-2 cig days; working on quitting (taking medication prescribed by primary) 11/16- per patient smoking about 3 cig per day  02/18/23 3 cigarettes a day.   Outpatient Medications Prior to Visit  Medication Sig Dispense Refill   albuterol  (PROAIR  HFA) 108 (90 Base) MCG/ACT inhaler 2 puffs every 4 hours as needed only  if your can't catch your breath 18 g 11   albuterol  (PROVENTIL ) (2.5 MG/3ML) 0.083% nebulizer solution Take 3 mLs (2.5 mg total) by nebulization every 4 (four) hours as  needed for wheezing or shortness of breath. 75 mL 12   ALLERGY RELIEF 180 MG tablet Take 180 mg by mouth daily.     benzonatate  (TESSALON ) 100 MG capsule Take by mouth 3 (three) times daily as needed for cough.     celecoxib (CELEBREX) 200 MG capsule 1 capsule with food     ergocalciferol (VITAMIN D2) 1.25 MG (50000 UT) capsule 1 capsule Orally ONCE WEEKLY for 90 days     famotidine  (PEPCID ) 20 MG tablet TAKE 1  TABLET BY MOUTH 1 HOUR BEFORE BEDTIME 30 tablet 11   losartan  (COZAAR ) 50 MG tablet 1 tablet Orally Once a day for 30 days     mometasone -formoterol  (DULERA ) 100-5 MCG/ACT AERO Take 2 puffs first thing in am and then another 2 puffs about 12 hours later. 1 each 11   pantoprazole  (PROTONIX ) 40 MG tablet Take 1 tablet (40 mg total) by mouth daily. Take 30-60 min before first meal of the day (Patient taking differently: Take 40 mg by mouth 2 (two) times daily before a meal. Take 30-60 min before first meal of the day) 30 tablet 2   sertraline (ZOLOFT) 25 MG tablet 1 tablet Orally Once a day     tiZANidine  (ZANAFLEX ) 4 MG tablet Take 1 tablet (4 mg total) by mouth at bedtime as needed for muscle spasms.     predniSONE  (STERAPRED UNI-PAK 48 TAB) 10 MG (48) TBPK tablet Take by mouth as directed.     No facility-administered medications prior to visit.     Review of Systems:   Constitutional: No weight loss or gain, night sweats, fevers, chills, or lassitude.+fatigue  HEENT: No headaches, difficulty swallowing, tooth/dental problems, or sore throat. No sneezing, itching, ear ache, nasal congestion, or post nasal drip CV:  No chest pain, orthopnea, PND, swelling in lower extremities, anasarca, dizziness, palpitations, syncope Resp: +snoring, chronic cough (worse at night), baseline shortness of breath with exertion. No excess mucus or change in color of mucus. No hemoptysis. No wheezing.  No chest wall deformity GI:  +occasional heartburn, indigestion. No abdominal pain, nausea, vomiting, diarrhea, change in bowel habits, loss of appetite, bloody stools.  GU: No nocturia  Skin: No rash, lesions, ulcerations MSK:  +chronic joint pains  Neuro: No memory impairment.  Psych: No depression +stable anxiety. Mood stable. +sleep disturbance     Physical Exam:  BP (!) 144/88 (BP Location: Left Arm)   Pulse (!) 107   Wt 202 lb 12.8 oz (92 kg)   LMP 04/28/2015   SpO2 95%   BMI 32.24 kg/m   GEN:  Pleasant, interactive, well-appearing; obese; in no acute distress HEENT:  Normocephalic and atraumatic. PERRLA. Sclera white. Nasal turbinates pink, moist and patent bilaterally. No rhinorrhea present. Oropharynx pink and moist, without exudate or edema. No lesions, ulcerations, or postnasal drip. Mallampati III/IV NECK:  Supple w/ fair ROM. Thyroid symmetrical with no goiter or nodules palpated. No lymphadenopathy.   CV: RRR, no m/r/g, no peripheral edema. Pulses intact, +2 bilaterally. No cyanosis, pallor or clubbing. PULMONARY:  Unlabored, regular breathing. Clear bilaterally A&P w/o wheezes/rales/rhonchi. No accessory muscle use.  GI: BS present and normoactive. Soft, non-tender to palpation. No organomegaly or masses detected.  MSK: No erythema, warmth or tenderness. Cap refil <2 sec all extrem. No deformities or joint swelling noted.  Neuro: A/Ox3. No focal deficits noted.   Skin: Warm, no lesions or rashe Psych: Normal affect and behavior. Judgement and thought content appropriate.     Lab Results:  CBC    Component Value Date/Time   WBC 9.8 07/19/2021 1426   RBC 4.98 07/19/2021 1426   HGB 16.1 (H) 07/19/2021 1426   HCT 48.7 (H) 07/19/2021 1426   PLT 301 07/19/2021 1426   MCV 97.8 07/19/2021 1426   MCH 32.3 07/19/2021 1426   MCHC 33.1 07/19/2021 1426   RDW 12.8 07/19/2021 1426   LYMPHSABS 2.1 07/19/2021 1426   MONOABS 0.8 07/19/2021 1426   EOSABS 0.1 07/19/2021 1426   BASOSABS 0.1 07/19/2021 1426    BMET    Component Value Date/Time   NA 141 07/19/2021 1426   K 3.8 07/19/2021 1426   CL 105 07/19/2021 1426   CO2 27 07/19/2021 1426   GLUCOSE 104 (H) 07/19/2021 1426   BUN 11 07/19/2021 1426   CREATININE 1.07 (H) 07/19/2021 1426   CALCIUM 9.1 07/19/2021 1426   GFRNONAA >60 07/19/2021 1426   GFRAA >60 10/02/2020 1051    BNP No results found for: BNP   Imaging:  No results found.  Administration History     None           No data to display           No results found for: NITRICOXIDE      Assessment & Plan:   Excessive daytime sleepiness She has snoring, excessive daytime sleepiness, nocturnal apneic events, sleep paralysis, restless sleep. BMI 32. History of polycythemia. Epworth 8. Given this,  I am concerned she could have sleep disordered breathing with obstructive sleep apnea. She will need sleep study for further evaluation.    - discussed how weight can impact sleep and risk for sleep disordered breathing - discussed options to assist with weight loss: combination of diet modification, cardiovascular and strength training exercises   - had an extensive discussion regarding the adverse health consequences related to untreated sleep disordered breathing - specifically discussed the risks for hypertension, coronary artery disease, cardiac dysrhythmias, cerebrovascular disease, and diabetes - lifestyle modification discussed   - discussed how sleep disruption can increase risk of accidents, particularly when driving - safe driving practices were discussed  Patient Instructions  Given your symptoms, I am concerned that you may have sleep disordered breathing with sleep apnea. You will need a sleep study for further evaluation. Someone will contact you to schedule this.   We discussed how untreated sleep apnea puts an individual at risk for cardiac arrhthymias, pulm HTN, DM, stroke and increases their risk for daytime accidents. We also briefly reviewed treatment options including weight loss, side sleeping position, oral appliance, CPAP therapy or referral to ENT for possible surgical options  Use caution when driving and pull over if you become sleepy.  Follow up in 6-8 weeks with Katie Dewanna Hurston,NP to go over sleep study results, or sooner, if needed. Friday PM virtual clinic preferred       Polycythemia Will rule out OSA as underlying cause. Likely due to smoking as well. See above. Follow up with PCP  COPD   GOLD 2 /  AB (poor baseline pfts) Stable. Compensated on current regimen. Action plan in place. Follow up with Dr. Waymond Hailey as scheduled  Obesity (BMI 30.0-34.9) BMI 32. Healthy weight loss encouraged   Advised if symptoms do not improve or worsen, to please contact office for sooner follow up or seek emergency care.   I spent 35 minutes of dedicated to the care of this patient on the date of this encounter to include pre-visit review of records, face-to-face time with the  patient discussing conditions above, post visit ordering of testing, clinical documentation with the electronic health record, making appropriate referrals as documented, and communicating necessary findings to members of the patients care team.  Roetta Clarke, NP 06/10/2024  Pt aware and understands NP's role.

## 2024-06-10 NOTE — Assessment & Plan Note (Signed)
 BMI 32. Healthy weight loss encouraged.

## 2024-06-10 NOTE — Assessment & Plan Note (Signed)
 She has snoring, excessive daytime sleepiness, nocturnal apneic events, sleep paralysis, restless sleep. BMI 32. History of polycythemia. Epworth 8. Given this,  I am concerned she could have sleep disordered breathing with obstructive sleep apnea. She will need sleep study for further evaluation.    - discussed how weight can impact sleep and risk for sleep disordered breathing - discussed options to assist with weight loss: combination of diet modification, cardiovascular and strength training exercises   - had an extensive discussion regarding the adverse health consequences related to untreated sleep disordered breathing - specifically discussed the risks for hypertension, coronary artery disease, cardiac dysrhythmias, cerebrovascular disease, and diabetes - lifestyle modification discussed   - discussed how sleep disruption can increase risk of accidents, particularly when driving - safe driving practices were discussed  Patient Instructions  Given your symptoms, I am concerned that you may have sleep disordered breathing with sleep apnea. You will need a sleep study for further evaluation. Someone will contact you to schedule this.   We discussed how untreated sleep apnea puts an individual at risk for cardiac arrhthymias, pulm HTN, DM, stroke and increases their risk for daytime accidents. We also briefly reviewed treatment options including weight loss, side sleeping position, oral appliance, CPAP therapy or referral to ENT for possible surgical options  Use caution when driving and pull over if you become sleepy.  Follow up in 6-8 weeks with Katie Shanae Luo,NP to go over sleep study results, or sooner, if needed. Friday PM virtual clinic preferred

## 2024-06-10 NOTE — Assessment & Plan Note (Signed)
 Will rule out OSA as underlying cause. Likely due to smoking as well. See above. Follow up with PCP

## 2024-06-10 NOTE — Assessment & Plan Note (Addendum)
 Stable. Compensated on current regimen. Action plan in place. Follow up with Dr. Waymond Hailey as scheduled

## 2024-06-10 NOTE — Patient Instructions (Signed)
 Given your symptoms, I am concerned that you may have sleep disordered breathing with sleep apnea. You will need a sleep study for further evaluation. Someone will contact you to schedule this.   We discussed how untreated sleep apnea puts an individual at risk for cardiac arrhthymias, pulm HTN, DM, stroke and increases their risk for daytime accidents. We also briefly reviewed treatment options including weight loss, side sleeping position, oral appliance, CPAP therapy or referral to ENT for possible surgical options  Use caution when driving and pull over if you become sleepy.  Follow up in 6-8 weeks with Katie Caterra Ostroff,NP to go over sleep study results, or sooner, if needed. Friday PM virtual clinic preferred

## 2024-06-28 NOTE — Progress Notes (Signed)
 Judy Leonard, female    DOB: 1966/04/17    MRN: [id number]  Brief patient profile:  59  yobf  active smoker referred to pulmonary clinic in [place]  11/14/2022 by Silvio Ramp  NP for copd eval with onset of symptoms 2019 with abn pfts c/w GOLD 2 copd   History of Present Illness  11/14/2022  Pulmonary/ 1st office eval/ Elliona Doddridge / [place] Office advair 100 one daily  / dulera  100 each pm with poor hfa technique Chief Complaint  Patient presents with   Consult    Consult for SOB with exertion   Dyspnea:  pushing basket basket at [employer]  Cough: smoker's rattle, worse  in am clears with 1-2 coughs /mucoid Sleep: once a week, waking up choking x sev years  SABA use: avg 3 x daily / never prechallenges  02: none  Lung cancer screen: rec per PCP  Rec Plan A = Automatic = Always=    Dulera  100 Take 2 puffs first thing in am and then another 2 puffs about 12 hours later.  Plan B = Backup (to supplement plan A, not to replace it) Only use your albuterol  inhaler as a rescue medication Also  Ok to try albuterol  15 min before an activity (on alternating days)  that you know would usually make you short of breath Stop fluticasone  inhaler    05/19/2023  f/u ov/[place] office/Crue Otero re: GOLD 2/ still smoking maint on breztri   with about 50% effective techniq  Chief Complaint  Patient presents with   Follow-up    Breathing has improved since the last visit. She has only had to use neb x 2 since the last visit.   Dyspnea:  walks 5 min loop plus back all day on feet/ limited by back pain  Cough: none  Sleeping: flat bed lots of pillows  SABA use: rarely hfa and once neb since last  02: none  Bad noct HB/ admit not compliant with diet  Rec Plan A = Automatic = Always=    Breztri  Take 2 puffs (or Symbicort 160) first thing in am and then another 2 puffs about 12 hours later.  Work on inhaler technique:   Plan B = Backup (to supplement plan A, not to replace it) Only use your albuterol   inhaler as a rescue medication Plan C = Crisis (instead of Plan B but only if Plan B stops working) - only use your albuterol  nebulizer if you first try Plan B GERD diet reviewed, bed blocks rec  The key is to stop smoking completely before smoking completely stops you! Work on inhaler technique:       11/17/2023  6 m  f/u ov/[place] office/Aragorn Recker re: GOLD 2 copd/ AB  maint on Breztri    Chief Complaint  Patient presents with   Cough   Dyspnea:  back stops her before breathing  Cough: none  Sleeping: flat bed s  resp cc  SABA use: occ uses dulera   02: none  Lung cancer screening: referred today  Rec Plan A = Automatic = Always=    Breztri  Take 2 puffs first thing in am and then another 2 puffs about 12 hours later.  Work on inhaler technique:   >>>  Remember how golfers warm up by taking practice swings - do this with an empty inhaler  Plan B = Backup (to supplement plan A, not to replace it) Only use your albuterol  inhaler as a rescue medication  Plan C = Crisis (instead of  Plan B but only if Plan B stops working) - only use your albuterol  nebulizer if you first try Plan B Please schedule a follow up visit in 6 months but call sooner if needed -bring inhlaers  04/15/24  LDSCT RAD2 mod emphysema   05/17/2024  f/u ov/[place] office/Tannie Koskela re: GOLD 2 copd/AB maint on saba   Chief Complaint  Patient presents with   Shortness of Breath  Dyspnea:  neck and back limiting / [employer] walk across parking lot walks slow whole store / Cough: better with tessalon  / had flares with sinus infection  zak/pred 10 m x 48 /since May 8th 2025 > no purulent secretions now Sleeping: flat bed chair position due to neck  s   resp cc  SABA use: increased since off breztri  due to throat irritation 02: none  Lung cancer screening:  q April  Overt hb on ppi bid and h2hs  Rec GERD diet reviewed, bed blocks rec  Work on inhaler technique:   Plan A = Automatic = Always=    Dulera  100  Take 2 puffs first thing in am and then another 2 puffs about 12 hours later.  Plan B = Backup (to supplement plan A, not to replace it) Only use your albuterol  inhaler as a rescue medication Plan C = Crisis (instead of Plan B but only if Plan B stops working) - only use your albuterol  nebulizer if you first try Plan B    Please schedule a follow up office visit in 6 weeks, call sooner if needed    07/01/2024  f/u ov/[place] office/Martrell Eguia re: GOLD 2 / AB  maint on dulera  100 / still smoking some  one half pack per week/ doing fine on dulera  100 s mouth irritation  Chief Complaint  Patient presents with   Follow-up   COPD  Dyspnea:  no change doe  Cough: slt rattle but not productive  Sleeping: 90 degrees due to neck pain x ever since neck surgery s resp cc  SABA use: in  late afternoon hfa only, no neb  02: none   Lung cancer screening: q April   No obvious day to day or daytime variability or assoc excess/ purulent sputum or mucus plugs or hemoptysis or cp or chest tightness, subjective wheeze or overt sinus or hb symptoms.    Also denies any obvious fluctuation of symptoms with weather or environmental changes or other aggravating or alleviating factors except as outlined above   No unusual exposure hx or h/o childhood pna/ asthma or knowledge of premature birth.  Current Allergies, Complete Past Medical History, Past Surgical History, Family History, and Social History were reviewed in [employer] record.  ROS  The following are not active complaints unless bolded Hoarseness, sore throat, dysphagia, dental problems, itching, sneezing,  nasal congestion or discharge of excess mucus or purulent secretions, ear ache,   fever, chills, sweats, unintended wt loss or wt gain, classically pleuritic or exertional cp,  orthopnea pnd or arm/hand swelling  or leg swelling, presyncope, palpitations, abdominal pain, anorexia, nausea, vomiting, diarrhea  or change in bowel  habits or change in bladder habits, change in stools or change in urine, dysuria, hematuria,  rash, arthralgias, visual complaints, headache, numbness, weakness or ataxia or problems with walking or coordination,  change in mood or  memory.        Current Meds  Medication Sig   albuterol  (PROAIR  HFA) 108 (90 Base) MCG/ACT inhaler 2 puffs every 4  hours as needed only  if your can't catch your breath   albuterol  (PROVENTIL ) (2.5 MG/3ML) 0.083% nebulizer solution Take 3 mLs (2.5 mg total) by nebulization every 4 (four) hours as needed for wheezing or shortness of breath.   ALLERGY RELIEF 180 MG tablet Take 180 mg by mouth daily.   benzonatate  (TESSALON ) 100 MG capsule Take by mouth 3 (three) times daily as needed for cough.   celecoxib (CELEBREX) 200 MG capsule 1 capsule with food   ergocalciferol (VITAMIN D2) 1.25 MG (50000 UT) capsule 1 capsule Orally ONCE WEEKLY for 90 days   famotidine  (PEPCID ) 20 MG tablet TAKE 1 TABLET BY MOUTH 1 HOUR BEFORE BEDTIME   losartan  (COZAAR ) 50 MG tablet 1 tablet Orally Once a day for 30 days   mometasone -formoterol  (DULERA ) 100-5 MCG/ACT AERO Take 2 puffs first thing in am and then another 2 puffs about 12 hours later.   pantoprazole  (PROTONIX ) 40 MG tablet Take 1 tablet (40 mg total) by mouth daily. Take 30-60 min before first meal of the day (Patient taking differently: Take 40 mg by mouth 2 (two) times daily before a meal. Take 30-60 min before first meal of the day)   predniSONE  (STERAPRED UNI-PAK 48 TAB) 10 MG (48) TBPK tablet Take by mouth as directed.   sertraline (ZOLOFT) 25 MG tablet 1 tablet Orally Once a day   tiZANidine  (ZANAFLEX ) 4 MG tablet Take 1 tablet (4 mg total) by mouth at bedtime as needed for muscle spasms.                Past Medical History:  Diagnosis Date   Arthritis    Asthma    Hypertension        Objective:    wts  07/01/2024         204   05/17/2024       203  11/17/2023     206   05/19/2023       205  04/07/2023          208  02/18/2023       206  12/27/2022     202   11/14/22 207 lb 6.4 oz (94.1 kg)  03/18/22 193 lb (87.5 kg)  09/13/21 199 lb 9.6 oz (90.5 kg)     Vital signs reviewed  07/01/2024  - Note at rest 02 sats  93% on RA   General appearance:    pleasant amb bf nad    HEENT : Oropharynx  clear   Nasal turbinates nl    NECK :  without  apparent JVD/ palpable Nodes/TM    LUNGS: no acc muscle use,  Min barrel  contour chest wall with bilateral  slightly decreased bs s audible wheeze and  without cough on insp or exp maneuvers and min  Hyperresonant  to  percussion bilaterally    CV:  RRR  no s3 or murmur or increase in P2, and no edema   ABD:  soft and nontender with pos end  insp Hoover's  in the supine position.  No bruits or organomegaly appreciated   MS:  Nl gait/ ext warm without deformities Or obvious joint restrictions  calf tenderness, cyanosis or clubbing     SKIN: warm and dry without lesions    NEURO:  alert, approp, nl sensorium with  no motor or cerebellar deficits apparent.             Assessment

## 2024-07-01 ENCOUNTER — Ambulatory Visit (INDEPENDENT_AMBULATORY_CARE_PROVIDER_SITE_OTHER): Admitting: Internal Medicine

## 2024-07-01 ENCOUNTER — Encounter: Payer: Self-pay | Admitting: Internal Medicine

## 2024-07-01 VITALS — BP 150/82 | HR 112 | Ht 66.0 in | Wt 204.8 lb

## 2024-07-01 DIAGNOSIS — J449 Chronic obstructive pulmonary disease, unspecified: Secondary | ICD-10-CM

## 2024-07-01 DIAGNOSIS — F17210 Nicotine dependence, cigarettes, uncomplicated: Secondary | ICD-10-CM | POA: Diagnosis not present

## 2024-07-01 NOTE — Assessment & Plan Note (Addendum)
 Active smoker  -  Spirometry 10/17/22 digital readout blurred,  f/v curve not concave  FEV1 1.39 (58%)  ratio 0.55 - 11/14/2022   Walked on RA   x  3  lap(s) =  approx 450  ft  @ mod pace, stopped due to end of study  with lowest 02 sats 94% with sob p one lap but never stopped - 11/14/2022  After extensive coaching inhaler device,  effectiveness =    75% short Ti late trigger > dulera  100 Take 2 puffs first thing in am and then another 2 puffs about 12 hours later.  - 12/27/2022   Walked on RA  x  3  lap(s) =  approx 450  ft  @ mod pace, stopped due to end of study with lowest 02 sats 95% and only mild sob p lap 1  - 02/18/2023   Walked on RA  x  3  lap(s) =  approx 750  ft  @ mod pace, stopped due to end of study with lowest 02 sats 95% mild sob p lap 1   Chest CTa   03/01/23  Moderate centrilobular emphysema changes prominent in the upper lobes. - 05/19/2023  After extensive coaching inhaler device,  effectiveness =    75% from baseline of 50% (short ti) > start breztri  > d/c''d  may 2025 burning throat p use was not true on duler 100 - 05/17/2024  After extensive coaching inhaler device,  effectiveness =    60% (short ti) > rechallenge with dulera  100, add spacer if needed and if not tolerating that may need trial of performist/bud 0.25 mg bid if insurance will cover  - 07/01/2024  After extensive coaching inhaler device,  effectiveness =    70% (short ti, short breath hold)  Improved to her satisfaction on simplified rx = dulera  100 2bid despite active smoking > no change in rx   F/u can be q m

## 2024-07-01 NOTE — Patient Instructions (Signed)
 Work on inhaler technique:  relax and gently blow all the way out then take a nice smooth full deep breath back in, triggering the inhaler at same time you start breathing in.  Hold breath in for at least  5 seconds if you can. Blow out dulera  thru nose. Rinse and gargle with water  when done.  If mouth or throat bother you at all,  try brushing teeth/gums/tongue with arm and hammer toothpaste/ make a slurry and gargle and spit out.   The key is to stop smoking completely before smoking completely stops you!    Please schedule a follow up visit in 6  months but call sooner if needed - bring inhalers

## 2024-07-02 NOTE — Assessment & Plan Note (Signed)
 Counseled re importance of smoking cessation but did not meet time criteria for separate billing    Reminded LDS due  q April       Each maintenance medication was reviewed in detail including emphasizing most importantly the difference between maintenance and prns and under what circumstances the prns are to be triggered using an action plan format where appropriate.  Total time for H and P, chart review, counseling, reviewing hfa  device(s) and generating customized AVS unique to this office visit / same day charting = 22 min

## 2024-07-22 ENCOUNTER — Ambulatory Visit: Admitting: Nurse Practitioner

## 2024-08-20 ENCOUNTER — Encounter: Payer: Self-pay | Admitting: Radiology

## 2024-10-13 ENCOUNTER — Encounter (INDEPENDENT_AMBULATORY_CARE_PROVIDER_SITE_OTHER): Payer: Self-pay | Admitting: Gastroenterology

## 2024-11-01 ENCOUNTER — Encounter: Payer: Self-pay | Admitting: Radiology

## 2024-12-30 ENCOUNTER — Other Ambulatory Visit: Payer: Self-pay | Admitting: Internal Medicine

## 2024-12-30 DIAGNOSIS — J449 Chronic obstructive pulmonary disease, unspecified: Secondary | ICD-10-CM

## 2024-12-31 NOTE — Telephone Encounter (Signed)
 Pt requesting refill of pantoprazole  - not mentioned in LOV notes to continue or d/c. Please advise, thank you
